# Patient Record
Sex: Female | Born: 1985
Health system: Southern US, Community
[De-identification: ages and names within clinical notes are randomized; demographics above are authoritative.]

## PROBLEM LIST (undated history)

## (undated) DIAGNOSIS — E079 Disorder of thyroid, unspecified: Secondary | ICD-10-CM

## (undated) DIAGNOSIS — E039 Hypothyroidism, unspecified: Secondary | ICD-10-CM

## (undated) DIAGNOSIS — F419 Anxiety disorder, unspecified: Secondary | ICD-10-CM

## (undated) DIAGNOSIS — U071 COVID-19: Secondary | ICD-10-CM

## (undated) DIAGNOSIS — T7840XA Allergy, unspecified, initial encounter: Secondary | ICD-10-CM

## (undated) DIAGNOSIS — D649 Anemia, unspecified: Secondary | ICD-10-CM

## (undated) DIAGNOSIS — D219 Benign neoplasm of connective and other soft tissue, unspecified: Secondary | ICD-10-CM

## (undated) HISTORY — DX: Hypothyroidism, unspecified: E03.9

## (undated) HISTORY — PX: CHOLECYSTECTOMY: SHX55

## (undated) HISTORY — DX: Allergy, unspecified, initial encounter: T78.40XA

## (undated) HISTORY — DX: Benign neoplasm of connective and other soft tissue, unspecified: D21.9

## (undated) HISTORY — PX: TUBAL LIGATION: SHX77

## (undated) HISTORY — PX: TONSILLECTOMY: SUR1361

## (undated) HISTORY — DX: Anemia, unspecified: D64.9

## (undated) HISTORY — DX: Anxiety disorder, unspecified: F41.9

---
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
## 2008-08-04 ENCOUNTER — Other Ambulatory Visit: Admission: RE | Admit: 2008-08-04 | Discharge: 2008-08-04 | Payer: Self-pay | Admitting: Obstetrics and Gynecology

## 2009-08-21 ENCOUNTER — Other Ambulatory Visit: Admission: RE | Admit: 2009-08-21 | Discharge: 2009-08-21 | Payer: Self-pay | Admitting: Obstetrics and Gynecology

## 2009-08-31 ENCOUNTER — Ambulatory Visit (HOSPITAL_COMMUNITY): Admission: RE | Admit: 2009-08-31 | Discharge: 2009-08-31 | Payer: Self-pay | Admitting: Obstetrics and Gynecology

## 2009-11-08 ENCOUNTER — Ambulatory Visit (HOSPITAL_COMMUNITY): Admission: RE | Admit: 2009-11-08 | Discharge: 2009-11-08 | Payer: Self-pay | Admitting: Obstetrics & Gynecology

## 2013-09-27 ENCOUNTER — Encounter (HOSPITAL_COMMUNITY): Payer: Self-pay | Admitting: Emergency Medicine

## 2013-09-27 ENCOUNTER — Emergency Department (HOSPITAL_COMMUNITY)
Admission: EM | Admit: 2013-09-27 | Discharge: 2013-09-27 | Disposition: A | Discharge status: Home / Self Care | Payer: Self-pay | Attending: Emergency Medicine | Admitting: Emergency Medicine

## 2013-09-27 DIAGNOSIS — R0781 Pleurodynia: Secondary | ICD-10-CM

## 2013-09-27 DIAGNOSIS — Y939 Activity, unspecified: Secondary | ICD-10-CM | POA: Insufficient documentation

## 2013-09-27 DIAGNOSIS — T148XXA Other injury of unspecified body region, initial encounter: Secondary | ICD-10-CM

## 2013-09-27 DIAGNOSIS — Y929 Unspecified place or not applicable: Secondary | ICD-10-CM | POA: Insufficient documentation

## 2013-09-27 DIAGNOSIS — X58XXXA Exposure to other specified factors, initial encounter: Secondary | ICD-10-CM | POA: Insufficient documentation

## 2013-09-27 DIAGNOSIS — S298XXA Other specified injuries of thorax, initial encounter: Secondary | ICD-10-CM | POA: Insufficient documentation

## 2013-09-27 DIAGNOSIS — S2341XA Sprain of ribs, initial encounter: Secondary | ICD-10-CM | POA: Insufficient documentation

## 2013-09-27 MED ORDER — DICLOFENAC SODIUM 75 MG PO TBEC: 75.0000 mg | DELAYED_RELEASE_TABLET | Freq: Two times a day (BID) | ORAL | Status: DC

## 2013-09-27 MED ORDER — METHOCARBAMOL 500 MG PO TABS: 500.0000 mg | ORAL_TABLET | Freq: Three times a day (TID) | ORAL | Status: DC

## 2013-09-27 MED ORDER — ACETAMINOPHEN-CODEINE #3 300-30 MG PO TABS: 1.0000 | ORAL_TABLET | Freq: Four times a day (QID) | ORAL | Status: DC | PRN

## 2013-09-27 NOTE — Discharge Instructions (Signed)
Your vital signs are within normal limits. Your lung exam is clear. Suspect a muscle related pain. Please rest your rib area muscles as much as possible. Use robaxin three times daily. Use diclofenac 2 times daily with food until all taken. Use tylenol codeine for pain if needed. Robaxin and tylenol codeine may cause drowsiness, use with caution. Chest Wall Pain Chest wall pain is pain in or around the bones and muscles of your chest. It may take up to 6 weeks to get better. It may take longer if you must stay physically active in your work and activities.  CAUSES  Chest wall pain may happen on its own. However, it may be caused by:  A viral illness like the flu.  Injury.  Coughing.  Exercise.  Arthritis.  Fibromyalgia.  Shingles. HOME CARE INSTRUCTIONS   Avoid overtiring physical activity. Try not to strain or perform activities that cause pain. This includes any activities using your chest or your abdominal and side muscles, especially if heavy weights are used.  Put ice on the sore area.  Put ice in a plastic bag.  Place a towel between your skin and the bag.  Leave the ice on for 15-20 minutes per hour while awake for the first 2 days.  Only take over-the-counter or prescription medicines for pain, discomfort, or fever as directed by your caregiver. SEEK IMMEDIATE MEDICAL CARE IF:   Your pain increases, or you are very uncomfortable.  You have a fever.  Your chest pain becomes worse.  You have new, unexplained symptoms.  You have nausea or vomiting.  You feel sweaty or lightheaded.  You have a cough with phlegm (sputum), or you cough up blood. MAKE SURE YOU:   Understand these instructions.  Will watch your condition.  Will get help right away if you are not doing well or get worse. Document Released: 01/07/2005 Document Revised: 04/01/2011 Document Reviewed: 09/03/2010 [facility] Patient Information 2015 [place], [place]. This information is not intended to  replace advice given to you by your health care provider. Make sure you discuss any questions you have with your health care provider.  Muscle Strain A muscle strain (pulled muscle) happens when a muscle is stretched beyond normal length. It happens when a sudden, violent force stretches your muscle too far. Usually, a few of the fibers in your muscle are torn. Muscle strain is common in athletes. Recovery usually takes 1-2 weeks. Complete healing takes 5-6 weeks.  HOME CARE   Follow the PRICE method of treatment to help your injury get better. Do this the first 2-3 days after the injury:  Protect. Protect the muscle to keep it from getting injured again.  Rest. Limit your activity and rest the injured body part.  Ice. Put ice in a plastic bag. Place a towel between your skin and the bag. Then, apply the ice and leave it on from 15-20 minutes each hour. After the third day, switch to moist heat packs.  Compression. Use a splint or elastic bandage on the injured area for comfort. Do not put it on too tightly.  Elevate. Keep the injured body part above the level of your heart.  Only take medicine as told by your doctor.  Warm up before doing exercise to prevent future muscle strains. GET HELP IF:   You have more pain or puffiness (swelling) in the injured area.  You feel numbness, tingling, or notice a loss of strength in the injured area. MAKE SURE YOU:   Understand these  instructions.  Will watch your condition.  Will get help right away if you are not doing well or get worse. Document Released: 10/17/2007 Document Revised: 10/28/2012 Document Reviewed: 08/06/2012 [facility] Patient Information 2015 [place], [place]. This information is not intended to replace advice given to you by your health care provider. Make sure you discuss any questions you have with your health care provider.

## 2013-09-27 NOTE — ED Notes (Signed)
Pt reports bilateral rib pain for several days. Pt denies any known injury. nad noted. Airway patent. No dyspnea noted in triage. Pt reports rib pain worse with movement and taking a deep breath.

## 2013-09-27 NOTE — ED Provider Notes (Signed)
Medical screening examination/treatment/procedure(s) were performed by non-physician practitioner and as supervising physician I was immediately available for consultation/collaboration.    Dorie Rank, MD 09/27/13 2239

## 2013-09-27 NOTE — ED Provider Notes (Signed)
CSN: [id number]     Arrival date & time 09/27/13  1535 History   None    Chief Complaint  Patient presents with  . Rib Injury     (Consider location/radiation/quality/duration/timing/severity/associated sxs/prior Treatment) HPI Comments: CC: bilat rib pain.  Pt reports 3-4 days of bilat rib pain. Pt c/o soreness and aching. Today pt noted pains in the ribs, worse with deep breathing.  No cough. No hx of injury. 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 This involves lifting and reaching over her head frequently. No fever. No operations or procedures involving the chest. Pt has taken advil and tylenol, but without improvement.  The history is provided by the patient.    History reviewed. No pertinent past medical history. Past Surgical History  Procedure Laterality Date  . Cholecystectomy    . Tonsillectomy     History reviewed. No pertinent family history. History  Substance Use Topics  . Smoking status: Never Smoker   . Smokeless tobacco: Not on file  . Alcohol Use: No   OB History   Grav Para Term Preterm Abortions TAB SAB Ect Mult Living                 Review of Systems  Constitutional: Negative for fever, chills and diaphoresis.  HENT: Negative.   Eyes: Negative.   Respiratory: Negative.   Cardiovascular: Negative for palpitations and leg swelling.  Gastrointestinal: Negative.   Endocrine: Negative.   Genitourinary: Negative.   Musculoskeletal: Negative.   Skin: Negative.   Allergic/Immunologic: Negative.   Neurological: Negative.   Hematological: Negative.   Psychiatric/Behavioral: Negative.       Allergies  Review of patient's allergies indicates not on file.  Home Medications   Prior to Admission medications   Not on File   BP 134/86  Pulse 80  Temp(Src) 98.5 F (36.9 C) (Oral)  Resp 18  Ht 5\' 3"  (1.6 m)  Wt 197 lb (89.359 kg)  BMI 34.91 kg/m2  SpO2 100%  LMP 08/30/2013 Physical Exam  Nursing note and vitals reviewed. Constitutional: She is  oriented to person, place, and time. She appears well-developed and well-nourished.  Non-toxic appearance.  HENT:  Head: Normocephalic.  Right Ear: Tympanic membrane and external ear normal.  Left Ear: Tympanic membrane and external ear normal.  Eyes: EOM and lids are normal. Pupils are equal, round, and reactive to light.  Neck: Normal range of motion. Neck supple. Carotid bruit is not present.  Cardiovascular: Normal rate, regular rhythm, normal heart sounds, intact distal pulses and normal pulses.   Pulmonary/Chest: Breath sounds normal. No respiratory distress.  Abdominal: Soft. Bowel sounds are normal. There is no tenderness. There is no guarding.  Musculoskeletal: Normal range of motion.  Lymphadenopathy:       Head (right side): No submandibular adenopathy present.       Head (left side): No submandibular adenopathy present.    She has no cervical adenopathy.  Neurological: She is alert and oriented to person, place, and time. She has normal strength. No cranial nerve deficit or sensory deficit.  Skin: Skin is warm and dry.  Psychiatric: She has a normal mood and affect. Her speech is normal.    ED Course  Procedures (including critical care time) Labs Review Labs Reviewed - No data to display  Imaging Review No results found.   EKG Interpretation None      MDM  Vital signs wnl PUlse ox 100% on room air. No trauma. Low probability of PE by  PERC score and Wells Crit. Suspect muscle strain . Rx for tylenol codeine, diclofenac, alnd robaxin given to the patient.   Final diagnoses:  None    **I have reviewed nursing notes, vital signs, and all appropriate lab and imaging results for this patient.Lenox Ahr, PA-C 09/27/13 1700

## 2015-08-04 DIAGNOSIS — R079 Chest pain, unspecified: Secondary | ICD-10-CM | POA: Diagnosis not present

## 2015-08-04 DIAGNOSIS — R0789 Other chest pain: Secondary | ICD-10-CM | POA: Diagnosis not present

## 2015-08-04 DIAGNOSIS — D649 Anemia, unspecified: Secondary | ICD-10-CM | POA: Diagnosis not present

## 2015-08-04 DIAGNOSIS — Z9049 Acquired absence of other specified parts of digestive tract: Secondary | ICD-10-CM | POA: Diagnosis not present

## 2015-12-03 ENCOUNTER — Ambulatory Visit (HOSPITAL_COMMUNITY)
Admission: EM | Admit: 2015-12-03 | Discharge: 2015-12-03 | Disposition: A | Discharge status: Home / Self Care | Payer: 59 | Attending: Family Medicine | Admitting: Family Medicine

## 2015-12-03 ENCOUNTER — Encounter (HOSPITAL_COMMUNITY): Payer: Self-pay | Admitting: *Deleted

## 2015-12-03 DIAGNOSIS — H6591 Unspecified nonsuppurative otitis media, right ear: Secondary | ICD-10-CM | POA: Diagnosis not present

## 2015-12-03 HISTORY — DX: Hypothyroidism, unspecified: E03.9

## 2015-12-03 HISTORY — DX: Disorder of thyroid, unspecified: E07.9

## 2015-12-03 MED ORDER — CIPROFLOXACIN-DEXAMETHASONE 0.3-0.1 % OT SUSP: OTIC | 0 refills | Status: AC

## 2015-12-03 NOTE — ED Triage Notes (Signed)
Has been using Benadryl, allergy meds, Dayquil for past 3 wks for cold sxs without any improvement.  Now c/o bilat earache x 3 days - R>L.  C/O decreased hearing.  Unsure if fevers.

## 2015-12-03 NOTE — Discharge Instructions (Signed)
It was nice seeing you. You do have middle ear effusion which can be treated conservatively with over the counter antihistamine such as zyrtec and pain medication. If no improvement in 3-4 days or symptoms worsens you may use the A/B prescribed. Please schedule follow up with ENT for reassessment if no improvement in your hearing.

## 2015-12-03 NOTE — ED Provider Notes (Signed)
[place]    CSN: [id number] Arrival date & time: 12/03/15  1528     History   Chief Complaint No chief complaint on file.   HPI Hannah Osborn is a 30 y.o. female.   The history is provided by the patient. No language interpreter was used.  Otalgia  Location:  Bilateral (Started with the right ear 3 days ago, now going to the left. She has been coughing for three weeks too) Quality:  Dull and pressure Severity:  Moderate Onset quality:  Gradual Duration:  3 days Timing:  Constant Progression:  Worsening Chronicity:  New Context: recent URI   Context: not direct blow, not elevation change and not loud noise   Relieved by:  Nothing Worsened by:  Nothing Ineffective treatments:  OTC medications Associated symptoms: congestion, hearing loss, rhinorrhea and sore throat   Associated symptoms: no tinnitus and no vomiting   Associated symptoms comment:  Cough now resolving. Right ear feels muffled   No past medical history on file.  There are no active problems to display for this patient.   Past Surgical History:  Procedure Laterality Date  . CHOLECYSTECTOMY    . TONSILLECTOMY      OB History    No data available       Home Medications    Prior to Admission medications   Medication Sig Start Date End Date Taking? Authorizing Provider  acetaminophen-codeine (TYLENOL #3) 300-30 MG per tablet Take 1-2 tablets by mouth every 6 (six) hours as needed for moderate pain. 09/27/13   Lily Kocher, PA-C  diclofenac (VOLTAREN) 75 MG EC tablet Take 1 tablet (75 mg total) by mouth 2 (two) times daily. 09/27/13   Lily Kocher, PA-C  methocarbamol (ROBAXIN) 500 MG tablet Take 1 tablet (500 mg total) by mouth 3 (three) times daily. 09/27/13   Lily Kocher, PA-C    Family History No family history on file.  Social History Social History  Substance Use Topics  . Smoking status: Never Smoker  . Smokeless tobacco: Not on file  . Alcohol use No      Allergies   Patient has no known allergies.   Review of Systems Review of Systems  HENT: Positive for congestion, ear pain, hearing loss, rhinorrhea and sore throat. Negative for tinnitus.   Respiratory: Negative.   Cardiovascular: Negative.   Gastrointestinal: Negative for vomiting.     Physical Exam Triage Vital Signs ED Triage Vitals  Enc Vitals Group     BP      Pulse      Resp      Temp      Temp src      SpO2      Weight      Height      Head Circumference      Peak Flow      Pain Score      Pain Loc      Pain Edu?      Excl. in [place]?    No data found.   Updated Vital Signs There were no vitals taken for this visit.  Visual Acuity Right Eye Distance:   Left Eye Distance:   Bilateral Distance:    Right Eye Near:   Left Eye Near:    Bilateral Near:     Physical Exam  Constitutional: She appears well-developed. No distress.  HENT:  Head: Normocephalic.  Right Ear: Tympanic membrane is not injected, not erythematous, not retracted and not bulging. No hemotympanum.  Left Ear: Tympanic membrane normal. Tympanic membrane is not injected, not erythematous, not retracted and not bulging.  No middle ear effusion. No hemotympanum.  Mouth/Throat: Uvula is midline, oropharynx is clear and moist and mucous membranes are normal.  Cardiovascular: Normal rate, regular rhythm and normal heart sounds.  Exam reveals no friction rub.   No murmur heard. Pulmonary/Chest: Effort normal and breath sounds normal. No respiratory distress. She has no wheezes. She has no rales.  Nursing note and vitals reviewed.    UC Treatments / Results  Labs (all labs ordered are listed, but only abnormal results are displayed) Labs Reviewed - No data to display  EKG  EKG Interpretation None       Radiology No results found.  Procedures Procedures (including critical care time)  Medications Ordered in UC Medications - No data to display   Initial Impression /  Assessment and Plan / UC Course  I have reviewed the triage vital signs and the nursing notes.  Pertinent labs & imaging results that were available during my care of the patient were reviewed by me and considered in my medical decision making (see chart for details).  Clinical Course as of Dec 02 1698  Sun Dec 03, 2015  1658 She has middle ear effusion. May be related to viral URI vs previous otitis media. Conservative measures recommended with antihistamine and pain meds. Ciprodex prescribed to start use if symptoms worsens despite conservative measures. Advised to follow up with ENT soon if no improvement in her hearing. She agreed with plan  [KE]    Clinical Course User Index [KE] Kinnie Feil, MD      Final Clinical Impressions(s) / UC Diagnoses   Final diagnoses:  None   Fluid level behind tympanic membrane of right ear   New Prescriptions New Prescriptions   No medications on file     Kinnie Feil, MD 12/03/15 1701

## 2015-12-18 ENCOUNTER — Ambulatory Visit (INDEPENDENT_AMBULATORY_CARE_PROVIDER_SITE_OTHER): Payer: 59 | Admitting: Family Medicine

## 2015-12-18 ENCOUNTER — Encounter: Payer: Self-pay | Admitting: Family Medicine

## 2015-12-18 VITALS — BP 124/76 | HR 88 | Temp 98.9°F | Resp 16 | Ht 63.25 in | Wt 195.1 lb

## 2015-12-18 DIAGNOSIS — N921 Excessive and frequent menstruation with irregular cycle: Secondary | ICD-10-CM | POA: Diagnosis not present

## 2015-12-18 DIAGNOSIS — N920 Excessive and frequent menstruation with regular cycle: Secondary | ICD-10-CM | POA: Insufficient documentation

## 2015-12-18 DIAGNOSIS — N3010 Interstitial cystitis (chronic) without hematuria: Secondary | ICD-10-CM | POA: Diagnosis not present

## 2015-12-18 DIAGNOSIS — E079 Disorder of thyroid, unspecified: Secondary | ICD-10-CM | POA: Diagnosis not present

## 2015-12-18 DIAGNOSIS — N83299 Other ovarian cyst, unspecified side: Secondary | ICD-10-CM

## 2015-12-18 DIAGNOSIS — D649 Anemia, unspecified: Secondary | ICD-10-CM

## 2015-12-18 DIAGNOSIS — Z8241 Family history of sudden cardiac death: Secondary | ICD-10-CM

## 2015-12-18 DIAGNOSIS — E6609 Other obesity due to excess calories: Secondary | ICD-10-CM | POA: Diagnosis not present

## 2015-12-18 DIAGNOSIS — Z7689 Persons encountering health services in other specified circumstances: Secondary | ICD-10-CM | POA: Diagnosis not present

## 2015-12-18 DIAGNOSIS — Z9109 Other allergy status, other than to drugs and biological substances: Secondary | ICD-10-CM

## 2015-12-18 DIAGNOSIS — J0101 Acute recurrent maxillary sinusitis: Secondary | ICD-10-CM

## 2015-12-18 DIAGNOSIS — Z6834 Body mass index (BMI) 34.0-34.9, adult: Secondary | ICD-10-CM | POA: Diagnosis not present

## 2015-12-18 MED ORDER — AMOXICILLIN 875 MG PO TABS: 875.0000 mg | ORAL_TABLET | Freq: Two times a day (BID) | ORAL | 0 refills | Status: DC

## 2015-12-18 MED ORDER — PREDNISONE 20 MG PO TABS: 20.0000 mg | ORAL_TABLET | Freq: Every day | ORAL | 0 refills | Status: DC

## 2015-12-18 NOTE — Progress Notes (Signed)
Chief Complaint  Patient presents with  . Establish Care   Has not been to a doctor in 7 years Has a list of medical problems she would like to be evaluated for Has an appt with GYN in the next couple of weeks Has an appt with ENT for next month  She has a history of thyroid disease and is not treated  Has a history of interstitial cystitis and has some urine symptoms She has menorrhagia and dysmenorrhea history of ovarian cysts History of allergies and current sinus infection for 2 months History of low blood count and "eats ice" all the time She gets frequent diarrhea after eating since her gallbladder surgery  She has had a flu shot this year at work She is up to date with tetanus per employee health     Patient Active Problem List   Diagnosis Date Noted  . Thyroid disorder 12/18/2015  . Interstitial cystitis 12/18/2015  . Anemia 12/18/2015  . Functional ovarian cysts 12/18/2015  . Menorrhagia 12/18/2015  . Class 1 obesity due to excess calories with body mass index (BMI) of 34.0 to 34.9 in adult 12/18/2015  . Environmental allergies 12/18/2015    Outpatient Encounter Prescriptions as of 12/18/2015  Medication Sig  . amoxicillin (AMOXIL) 875 MG tablet Take 1 tablet (875 mg total) by mouth 2 (two) times daily.  . predniSONE (DELTASONE) 20 MG tablet Take 1 tablet (20 mg total) by mouth daily with breakfast.   No facility-administered encounter medications on file as of 12/18/2015.     Past Medical History:  Diagnosis Date  . Allergy   . Anemia   . Anxiety   . Hypothyroidism   . Thyroid disease     Past Surgical History:  Procedure Laterality Date  . CHOLECYSTECTOMY    . TONSILLECTOMY      Social History   Social History  . Marital status: Single    Spouse name: N/A  . Number of children: 1  . Years of education: 71   Occupational History  . dietary aid [employer]   Social History Main Topics  . Smoking status: Never Smoker  .  Smokeless tobacco: Never Used  . Alcohol use No  . Drug use: No  . Sexual activity: Yes    Birth control/ protection: None   Other Topics Concern  . Not on file   Social History Narrative   Lives with son Naida Sleight   Boyfriend for 48 years   Works in Banker at [employer]    Family History  Problem Relation Age of Onset  . Pulmonary embolism Mother   . Early death Mother   . Cancer Father     stomach  . Early death Father   . Asthma Son   . Allergies Son   . Heart disease Maternal Uncle   . Cancer Maternal Grandmother     uterine  . Cancer Maternal Grandfather     colon    Review of Systems  Constitutional: Positive for malaise/fatigue. Negative for chills, fever and weight loss.  HENT: Positive for congestion, ear pain and sinus pain. Negative for hearing loss.   Eyes: Negative for blurred vision and pain.  Respiratory: Negative for cough and shortness of breath.   Cardiovascular: Negative for chest pain and leg swelling.  Gastrointestinal: Negative for abdominal pain, constipation, diarrhea and heartburn.  Genitourinary: Positive for dysuria. Negative for frequency.       Menstrual problems  Musculoskeletal: Negative for falls,  joint pain and myalgias.  Neurological: Negative for dizziness, seizures and headaches.  Psychiatric/Behavioral: Negative for depression. The patient is not nervous/anxious and does not have insomnia.     BP 124/76 (BP Location: Right Arm, Patient Position: Sitting, Cuff Size: Normal)   Pulse 88   Temp 98.9 F (37.2 C) (Oral)   Resp 16   Ht 5' 3.25" (1.607 m)   Wt 195 lb 1.9 oz (88.5 kg)   LMP 12/02/2015 (Exact Date)   SpO2 100%   BMI 34.29 kg/m   Physical Exam  Constitutional: She is oriented to person, place, and time. She appears well-developed and well-nourished.  HENT:  Head: Normocephalic and atraumatic.  Right Ear: External ear normal.  Left Ear: External ear normal.  Mouth/Throat: Oropharynx is clear and moist.    Right TM with serous fluid  Eyes: Conjunctivae are normal. Pupils are equal, round, and reactive to light.  Neck: Normal range of motion. Neck supple. No thyromegaly present.  Cardiovascular: Normal rate, regular rhythm and normal heart sounds.   Pulmonary/Chest: Effort normal and breath sounds normal. No respiratory distress.  Abdominal: Soft. Bowel sounds are normal.  Musculoskeletal: Normal range of motion. She exhibits no edema.  Lymphadenopathy:    She has no cervical adenopathy.  Neurological: She is alert and oriented to person, place, and time.  Gait normal  Skin: Skin is warm and dry.  Psychiatric: She has a normal mood and affect. Her behavior is normal. Thought content normal.  Nursing note and vitals reviewed.  ASSESSMENT/PLAN:   1. Encounter to establish care with new doctor  - CBC with Differential/Platelet - COMPLETE METABOLIC PANEL WITH GFR - Hemoglobin A1c - Lipid panel - TSH - Urinalysis, Routine w reflex microscopic (not at [facility]) - VITAMIN D 25 Hydroxy (Vit-D Deficiency, Fractures)  2. Thyroid disorder   3. Interstitial cystitis   4. Anemia, unspecified type   5. Functional ovarian cysts   6. Menorrhagia with irregular cycle   7. Class 1 obesity due to excess calories without serious comorbidity with body mass index (BMI) of 34.0 to 34.9 in adult   8. Environmental allergies   9. Family history of death due to heart problem at 58 years of age or younger   3. Acute recurrent maxillary sinusitis    Patient Instructions  Drink plenty of water Use flonase or a steroid nasal spray every day Take the prednisone and the amoxicillin daily for 10 days See ENT if this does not clear up the infection  I have ordered labs I will send you a letter with your test results.  If there is anything of concern, we will call right away. See me in 2-3 weeks  See GYN as scheduled    Raylene Everts, MD

## 2015-12-18 NOTE — Patient Instructions (Addendum)
Drink plenty of water Use flonase or a steroid nasal spray every day Take the prednisone and the amoxicillin daily for 10 days See ENT if this does not clear up the infection  I have ordered labs I will send you a letter with your test results.  If there is anything of concern, we will call right away. See me in 2-3 weeks  See GYN as scheduled

## 2015-12-19 ENCOUNTER — Other Ambulatory Visit: Payer: Self-pay | Admitting: Family Medicine

## 2015-12-19 DIAGNOSIS — D649 Anemia, unspecified: Secondary | ICD-10-CM

## 2015-12-19 LAB — CBC WITH DIFFERENTIAL/PLATELET
BASOS ABS: 0 {cells}/uL (ref 0–200)
Basophils Relative: 0 %
EOS PCT: 1 %
Eosinophils Absolute: 58 cells/uL (ref 15–500)
HCT: 27 % — ABNORMAL LOW (ref 35.0–45.0)
HEMOGLOBIN: 7.4 g/dL — AB (ref 11.7–15.5)
LYMPHS ABS: 1624 {cells}/uL (ref 850–3900)
Lymphocytes Relative: 28 %
MCH: 15.7 pg — AB (ref 27.0–33.0)
MCHC: 27.4 g/dL — AB (ref 32.0–36.0)
MCV: 57.4 fL — ABNORMAL LOW (ref 80.0–100.0)
MONOS PCT: 4 %
Monocytes Absolute: 232 cells/uL (ref 200–950)
NEUTROS PCT: 67 %
Neutro Abs: 3886 cells/uL (ref 1500–7800)
Platelets: 416 10*3/uL — ABNORMAL HIGH (ref 140–400)
RBC: 4.7 MIL/uL (ref 3.80–5.10)
RDW: 19.6 % — ABNORMAL HIGH (ref 11.0–15.0)
WBC: 5.8 10*3/uL (ref 3.8–10.8)

## 2015-12-19 LAB — COMPLETE METABOLIC PANEL WITH GFR
ALBUMIN: 4 g/dL (ref 3.6–5.1)
ALK PHOS: 67 U/L (ref 33–115)
ALT: 16 U/L (ref 6–29)
AST: 20 U/L (ref 10–30)
BUN: 13 mg/dL (ref 7–25)
CO2: 25 mmol/L (ref 20–31)
Calcium: 9.1 mg/dL (ref 8.6–10.2)
Chloride: 106 mmol/L (ref 98–110)
Creat: 0.87 mg/dL (ref 0.50–1.10)
GFR, Est African American: >89 mL/min (ref >=60)
GLUCOSE: 95 mg/dL (ref 65–99)
POTASSIUM: 4.2 mmol/L (ref 3.5–5.3)
SODIUM: 139 mmol/L (ref 135–146)
Total Bilirubin: 0.2 mg/dL (ref 0.2–1.2)
Total Protein: 6.8 g/dL (ref 6.1–8.1)

## 2015-12-19 LAB — LIPID PANEL
CHOLESTEROL: 188 mg/dL (ref <200)
HDL: 52 mg/dL (ref >50)
LDL Cholesterol: 91 mg/dL (ref <100)
TRIGLYCERIDES: 224 mg/dL — AB (ref <150)
Total CHOL/HDL Ratio: 3.6 Ratio (ref <5.0)
VLDL: 45 mg/dL — AB (ref <30)

## 2015-12-19 LAB — URINALYSIS, ROUTINE W REFLEX MICROSCOPIC
Bilirubin Urine: NEGATIVE
Glucose, UA: NEGATIVE
Hgb urine dipstick: NEGATIVE
Ketones, ur: NEGATIVE
LEUKOCYTES UA: NEGATIVE
NITRITE: NEGATIVE
PH: 7.5 (ref 5.0–8.0)
Protein, ur: NEGATIVE
SPECIFIC GRAVITY, URINE: 1.015 (ref 1.001–1.035)

## 2015-12-19 LAB — TSH: TSH: 4.1 mIU/L

## 2015-12-19 LAB — HEMOGLOBIN A1C
HEMOGLOBIN A1C: 4.9 % (ref <5.7)
MEAN PLASMA GLUCOSE: 94 mg/dL

## 2015-12-19 LAB — VITAMIN D 25 HYDROXY (VIT D DEFICIENCY, FRACTURES): Vit D, 25-Hydroxy: 9 ng/mL — ABNORMAL LOW (ref 30–100)

## 2015-12-19 MED ORDER — VITAMIN D (ERGOCALCIFEROL) 1.25 MG (50000 UNIT) PO CAPS: 50000.0000 [IU] | ORAL_CAPSULE | ORAL | 1 refills | Status: DC

## 2015-12-21 ENCOUNTER — Encounter (HOSPITAL_COMMUNITY): Payer: 59 | Attending: Oncology | Admitting: Oncology

## 2015-12-21 ENCOUNTER — Encounter (HOSPITAL_COMMUNITY): Payer: Self-pay | Admitting: Oncology

## 2015-12-21 ENCOUNTER — Encounter (HOSPITAL_COMMUNITY): Payer: 59

## 2015-12-21 VITALS — BP 127/79 | HR 95 | Temp 99.0°F | Resp 16 | Wt 195.6 lb

## 2015-12-21 DIAGNOSIS — D508 Other iron deficiency anemias: Secondary | ICD-10-CM

## 2015-12-21 DIAGNOSIS — Z808 Family history of malignant neoplasm of other organs or systems: Secondary | ICD-10-CM

## 2015-12-21 DIAGNOSIS — D539 Nutritional anemia, unspecified: Secondary | ICD-10-CM | POA: Diagnosis not present

## 2015-12-21 DIAGNOSIS — Z5189 Encounter for other specified aftercare: Secondary | ICD-10-CM | POA: Diagnosis not present

## 2015-12-21 DIAGNOSIS — Z79899 Other long term (current) drug therapy: Secondary | ICD-10-CM | POA: Diagnosis not present

## 2015-12-21 DIAGNOSIS — D509 Iron deficiency anemia, unspecified: Secondary | ICD-10-CM | POA: Insufficient documentation

## 2015-12-21 DIAGNOSIS — Z800 Family history of malignant neoplasm of digestive organs: Secondary | ICD-10-CM | POA: Diagnosis not present

## 2015-12-21 DIAGNOSIS — Z9889 Other specified postprocedural states: Secondary | ICD-10-CM | POA: Insufficient documentation

## 2015-12-21 DIAGNOSIS — K625 Hemorrhage of anus and rectum: Secondary | ICD-10-CM | POA: Insufficient documentation

## 2015-12-21 DIAGNOSIS — R531 Weakness: Secondary | ICD-10-CM | POA: Diagnosis not present

## 2015-12-21 DIAGNOSIS — Z9049 Acquired absence of other specified parts of digestive tract: Secondary | ICD-10-CM | POA: Insufficient documentation

## 2015-12-21 LAB — IRON AND TIBC
IRON: 8 ug/dL — AB (ref 28–170)
SATURATION RATIOS: 1 % — AB (ref 10.4–31.8)
TIBC: 557 ug/dL — ABNORMAL HIGH (ref 250–450)
UIBC: 549 ug/dL

## 2015-12-21 LAB — CBC WITH DIFFERENTIAL/PLATELET
BASOS ABS: 0 10*3/uL (ref 0.0–0.1)
BASOS PCT: 0 %
EOS ABS: 0 10*3/uL (ref 0.0–0.7)
Eosinophils Relative: 0 %
HCT: 26.6 % — ABNORMAL LOW (ref 36.0–46.0)
HEMOGLOBIN: 7.5 g/dL — AB (ref 12.0–15.0)
Lymphocytes Relative: 10 %
Lymphs Abs: 0.8 10*3/uL (ref 0.7–4.0)
MCH: 16 pg — AB (ref 26.0–34.0)
MCHC: 28.2 g/dL — AB (ref 30.0–36.0)
MCV: 56.7 fL — ABNORMAL LOW (ref 78.0–100.0)
MONO ABS: 0.1 10*3/uL (ref 0.1–1.0)
MONOS PCT: 2 %
NEUTROS ABS: 7.1 10*3/uL (ref 1.7–7.7)
Neutrophils Relative %: 88 %
PLATELETS: 379 10*3/uL (ref 150–400)
RBC: 4.69 MIL/uL (ref 3.87–5.11)
RDW: 19.6 % — AB (ref 11.5–15.5)
WBC: 8.1 10*3/uL (ref 4.0–10.5)

## 2015-12-21 LAB — FERRITIN: FERRITIN: 1 ng/mL — AB (ref 11–307)

## 2015-12-21 MED ORDER — FERROUS SULFATE 325 (65 FE) MG PO TBEC
325.0000 mg | DELAYED_RELEASE_TABLET | Freq: Two times a day (BID) | ORAL | 3 refills | Status: DC
Start: 2015-12-21 — End: 2016-10-30

## 2015-12-21 NOTE — Progress Notes (Signed)
[place] @ [facility] [phone]  Fax:([phone]) Irene OB: 1985/03/24  MR#: [id number]  JQZ#:[id number]  Patient Care Team: Jalene Mullet, PA-C as PCP - General (General Practice)  CHIEF COMPLAINT:  Chief Complaint  Patient presents with  . New Patient (Initial Visit)    VISIT DIAGNOSIS:     ICD-9-CM ICD-10-CM   1. Other iron deficiency anemia 280.8 D50.8 Hemoglobin and hematocrit, blood     Iron and TIBC     Ferritin     CBC with Differential     ferrous sulfate 325 (65 FE) MG EC tablet      No history exists.    No flowsheet data found.  INTERVAL HISTORY:   30 year old lady who presented with hemoglobin of 7.4. Has macrocytic anemia Patient has a heavy menstrual cycle for last several years Feeling weak and tired.  Patient has recurrent sinus infection recently has been treated with amoxicillin and prednisone Patient has occasional rectal bleeding and not been investigated any further at present time  REVIEW OF SYSTEMS:   GENERAL patient is feeling weak and tired.  No fevers, sweats or weight loss. PERFORMANCE STATUS (ECOG):  0 HEENT: Recurrent sinus infection.  Multiple emergency room visits.  Recently on amoxicillin and prednisone Lungs: Or redness of breath on exertion Cardiac:  No chest pain, palpitations, orthopnea, or PND. GI:  No nausea, vomiting, diarrhea, constipation, melena or hematochezia. GU:  No urgency, frequency, dysuria, or hematuria. Heavy menstrual cycle for last several months Musculoskeletal:  No back pain.  No joint pain.  No muscle tenderness. Extremities:  No pain or swelling. Skin:  No rashes or skin changes. Neuro:  No headache, numbness or weakness, balance or coordination issues. Endocrine:  No diabetes, thyroid issues, hot flashes or night sweats. Psych:  No mood changes, depression or anxiety. Pain:  No focal pain.  Review of systems:  All other systems reviewed and found to be  negative. As per HPI. Otherwise, a complete review of systems is negatve.  PAST MEDICAL HISTORY: Past Medical History:  Diagnosis Date  . Allergy   . Anemia   . Anxiety   . Hypothyroidism   . Thyroid disease     PAST SURGICAL HISTORY: Past Surgical History:  Procedure Laterality Date  . CHOLECYSTECTOMY    . TONSILLECTOMY      FAMILY HISTORY Family History  Problem Relation Age of Onset  . Pulmonary embolism Mother   . Early death Mother   . Cancer Father     stomach  . Early death Father   . Asthma Son   . Allergies Son   . Heart disease Maternal Uncle   . Cancer Maternal Grandmother     uterine  . Cancer Maternal Grandfather     colon    GYNECOLOGIC HISTORY:  Patient's last menstrual period was 12/02/2015 (exact date).     ADVANCED DIRECTIVES:  Patient does not have any living will or healthcare power of attorney.  Information was given .  Available resources had been discussed.  We will follow-up on subsequent appointments regarding this issue  HEALTH MAINTENANCE: Social History  Substance Use Topics  . Smoking status: Never Smoker  . Smokeless tobacco: Never Used  . Alcohol use No     Colonoscopy:  PAP:  Bone density:  Lipid panel:  No Known Allergies  Current Outpatient Prescriptions  Medication Sig Dispense Refill  . amoxicillin (AMOXIL) 875 MG tablet Take 1 tablet (875 mg total)  by mouth 2 (two) times daily. 20 tablet 0  . ferrous sulfate 325 (65 FE) MG EC tablet Take 1 tablet (325 mg total) by mouth 2 (two) times daily. 100 tablet 3  . predniSONE (DELTASONE) 20 MG tablet Take 1 tablet (20 mg total) by mouth daily with breakfast. 10 tablet 0   No current facility-administered medications for this visit.     OBJECTIVE: PHYSICAL EXAM: GENERAL:  Well developed, well nourished, sitting comfortably in the exam room in no acute distress.  MENTAL STATUS:  Alert and oriented to person, place and time. HEAD: face symmetric, no Cushingoid features.   Periorbital swelling secondary to steroid EYES: pale  looking conjunctiva Pupils equal round and reactive to light and accomodation.  No conjunctivitis or scleral icterus. ENT:  Oropharynx clear without lesion.  Tongue normal. Mucous membranes moist.  RESPIRATORY:  Clear to auscultation without rales, wheezes or rhonchi. CARDIOVASCULAR:  Regular rate and rhythm without murmur, rub or gallop.  Tachycardia BREAST:  Right breast without masses, skin changes or nipple discharge.  Left breast without masses, skin changes or nipple discharge. ABDOMEN:  Soft, non-tender, with active bowel sounds, and no hepatosplenomegaly.  No masses. BACK:  No CVA tenderness.  No tenderness on percussion of the back or rib cage. SKIN:  No rashes, ulcers or lesions. EXTREMITIES: No edema, no skin discoloration or tenderness.  No palpable cords. LYMPH NODES: No palpable cervical, supraclavicular, axillary or inguinal adenopathy  NEUROLOGICAL: Unremarkable. PSYCH:  Appropriate. Breast was not examined Vitals:   12/21/15 1501  BP: 127/79  Pulse: 95  Resp: 16  Temp: 99 F (37.2 C)     Body mass index is 34.38 kg/m.    ECOG FS:1 - Symptomatic but completely ambulatory  LAB RESULTS:  Office Visit on 12/18/2015  Component Date Value Ref Range Status  . WBC 12/19/2015 5.8  3.8 - 10.8 K/uL Final  . RBC 12/19/2015 4.70  3.80 - 5.10 MIL/uL Final   Comment: Elliptocytes Schistocytes present (2-10/hpf)   . Hemoglobin 12/19/2015 7.4* 11.7 - 15.5 g/dL Final  . HCT 12/19/2015 27.0* 35.0 - 45.0 % Final  . MCV 12/19/2015 57.4* 80.0 - 100.0 fL Final  . MCH 12/19/2015 15.7* 27.0 - 33.0 pg Final  . MCHC 12/19/2015 27.4* 32.0 - 36.0 g/dL Final  . RDW 12/19/2015 19.6* 11.0 - 15.0 % Final  . Platelets 12/19/2015 416* 140 - 400 K/uL Final  . Neutro Abs 12/19/2015 3886  1,500 - 7,800 cells/uL Final  . Lymphs Abs 12/19/2015 1624  850 - 3,900 cells/uL Final  . Monocytes Absolute 12/19/2015 232  200 - 950 cells/uL Final  .  Eosinophils Absolute 12/19/2015 58  15 - 500 cells/uL Final  . Basophils Absolute 12/19/2015 0  0 - 200 cells/uL Final  . Neutrophils Relative % 12/19/2015 67  % Final  . Lymphocytes Relative 12/19/2015 28  % Final  . Monocytes Relative 12/19/2015 4  % Final  . Eosinophils Relative 12/19/2015 1  % Final  . Basophils Relative 12/19/2015 0  % Final  . Smear Review 12/19/2015 Criteria for review not met   Final  . Sodium 12/19/2015 139  135 - 146 mmol/L Final  . Potassium 12/19/2015 4.2  3.5 - 5.3 mmol/L Final  . Chloride 12/19/2015 106  98 - 110 mmol/L Final  . CO2 12/19/2015 25  20 - 31 mmol/L Final  . Glucose, Bld 12/19/2015 95  65 - 99 mg/dL Final  . BUN 12/19/2015 13  7 - 25 mg/dL Final  .  Creat 12/19/2015 0.87  0.50 - 1.10 mg/dL Final  . Total Bilirubin 12/19/2015 0.2  0.2 - 1.2 mg/dL Final  . Alkaline Phosphatase 12/19/2015 67  33 - 115 U/L Final  . AST 12/19/2015 20  10 - 30 U/L Final  . ALT 12/19/2015 16  6 - 29 U/L Final  . Total Protein 12/19/2015 6.8  6.1 - 8.1 g/dL Final  . Albumin 12/19/2015 4.0  3.6 - 5.1 g/dL Final  . Calcium 12/19/2015 9.1  8.6 - 10.2 mg/dL Final  . GFR, Est African American 12/19/2015 >89  >=60 mL/min Final  . GFR, Est Non African American 12/19/2015 >89  >=60 mL/min Final  . Hgb A1c MFr Bld 12/19/2015 4.9  <5.7 % Final   Comment:   For the purpose of screening for the presence of diabetes:   <5.7%       Consistent with the absence of diabetes 5.7-6.4 %   Consistent with increased risk for diabetes (prediabetes) >=6.5 %     Consistent with diabetes   This assay result is consistent with a decreased risk of diabetes.   Currently, no consensus exists regarding use of hemoglobin A1c for diagnosis of diabetes in children.   According to American Diabetes Association (ADA) guidelines, hemoglobin A1c <7.0% represents optimal control in non-pregnant diabetic patients. Different metrics may apply to specific patient populations. Standards of Medical  Care in Diabetes (ADA).     . Mean Plasma Glucose 12/19/2015 94  mg/dL Final  . Cholesterol 12/19/2015 188  <200 mg/dL Final   Comment: ** Please note change in reference range(s). **     . Triglycerides 12/19/2015 224* <150 mg/dL Final   Comment: ** Please note change in reference range(s). **     . HDL 12/19/2015 52  >50 mg/dL Final   Comment: ** Please note change in reference range(s). **     . Total CHOL/HDL Ratio 12/19/2015 3.6  <5.0 Ratio Final  . VLDL 12/19/2015 45* <30 mg/dL Final  . LDL Cholesterol 12/19/2015 91  <100 mg/dL Final   Comment: ** Please note change in reference range(s). **     . TSH 12/19/2015 4.10  mIU/L Final   Comment:   Reference Range   > or = 20 Years  0.40-4.50   Pregnancy Range First trimester  0.26-2.66 Second trimester 0.55-2.73 Third trimester  0.43-2.91     . Color, Urine 12/19/2015 YELLOW  YELLOW Final  . APPearance 12/19/2015 CLEAR  CLEAR Final  . Specific Gravity, Urine 12/19/2015 1.015  1.001 - 1.035 Final  . pH 12/19/2015 7.5  5.0 - 8.0 Final  . Glucose, UA 12/19/2015 NEGATIVE  NEGATIVE Final  . Bilirubin Urine 12/19/2015 NEGATIVE  NEGATIVE Final  . Ketones, ur 12/19/2015 NEGATIVE  NEGATIVE Final  . Hgb urine dipstick 12/19/2015 NEGATIVE  NEGATIVE Final  . Protein, ur 12/19/2015 NEGATIVE  NEGATIVE Final  . Nitrite 12/19/2015 NEGATIVE  NEGATIVE Final  . Leukocytes, UA 12/19/2015 NEGATIVE  NEGATIVE Final  . Vit D, 25-Hydroxy 12/19/2015 9* 30 - 100 ng/mL Final   Comment: Vitamin D Status           25-OH Vitamin D        Deficiency                <20 ng/mL        Insufficiency         20 - 29 ng/mL        Optimal             >  or = 30 ng/mL   For 25-OH Vitamin D testing on patients on D2-supplementation and patients for whom quantitation of D2 and D3 fractions is required, the QuestAssureD 25-OH VIT D, (D2,D3), LC/MS/MS is recommended: order code [id number] (patients > 2 yrs).      STUDIES: No results found.  ASSESSMENT:    Macrocytic anemia most likely secondary to iron deficiency anemia Blood loss maybe secondary to heavy menstrual cycle however other causes cannot be ruled out.  Patient had occasional rectal bleeding and may need further workup. We will get iron studies done and if that indicates iron deficiency anemia patient will be started on iron tablet with consideration of intravenous iron.  If iron  study does not confirm severe iron deficiency anemia than patient may need further workup to rule out thalassemia. I had detailed discussion with patient regarding that. Patient continues to feel very weak and tired may want to go to emergency room for further evaluation. We will hold off any blood transfusion hoping to find correctable cause for anemia likely iron deficiency.   PLAN:  No problem-specific Assessment & Plan notes found for this encounter.   Patient expressed understanding and was in agreement with this plan. She also understands that She can call clinic at any time with any questions, concerns, or complaints.    No matching staging information was found for the patient.  Forest Gleason, MD   12/21/2015 3:32 PM

## 2015-12-21 NOTE — Patient Instructions (Addendum)
[place] at [facility] Discharge Instructions  RECOMMENDATIONS MADE BY THE CONSULTANT AND ANY TEST RESULTS WILL BE SENT TO YOUR REFERRING PHYSICIAN.  Exam with Dr. Oliva Bustard today. You will have labs as you leave today to test your iron levels. We are going to prescribe iron for you to start taking today.     Return to the clinic next week for labs and for doctor appointment.    Thank you for choosing [place] at [facility] to provide your oncology and hematology care.  To afford each patient quality time with our provider, please arrive at least 15 minutes before your scheduled appointment time.   Beginning January 23rd 2017 lab work for the [facility] will be done in the  Main lab at [facility] on 1st floor. If you have a lab appointment with the [facility] please come in thru the  Main Entrance and check in at the main information desk  You need to re-schedule your appointment should you arrive 10 or more minutes late.  We strive to give you quality time with our providers, and arriving late affects you and other patients whose appointments are after yours.  Also, if you no show three or more times for appointments you may be dismissed from the clinic at the providers discretion.     Again, thank you for choosing [facility].  Our hope is that these requests will decrease the amount of time that you wait before being seen by our physicians.       _____________________________________________________________  Should you have questions after your visit to [facility], please contact our office at [phone] between the hours of 8:30 a.m. and 4:30 p.m.  Voicemails left after 4:30 p.m. will not be returned until the following business day.  For prescription refill requests, have your pharmacy contact our office.         Resources For Cancer Patients and their Caregivers ? American Cancer Society: Can  assist with transportation, wigs, general needs, runs Look Good Feel Better.        646-880-7748 ? Cancer Care: Provides financial assistance, online support groups, medication/co-pay assistance.  1-800-813-HOPE 213-709-8354) ? [place] Assists [place] cancer patients and their families through emotional , educational and financial support.  (585) 631-4774 ? [place] DSS Where to apply for food stamps, Medicaid and utility assistance. (225)674-4481 ? [facility]: Transportation to medical appointments. (754) 779-4064 ? Social Security Administration: May apply for disability if have a Stage IV cancer. [phone] ? [employer], Disability and Transit Services: Assists with nutrition, care and transit needs. [facility] Support Programs: @10RELATIVEDAYS @ > Cancer Support Group  2nd Tuesday of the month 1pm-2pm, Journey Room  > Creative Journey  3rd Tuesday of the month 1130am-1pm, Journey Room  > Look Good Feel Better  1st Wednesday of the month 10am-12 noon, Journey Room (Call [place] to register [phone])

## 2015-12-26 ENCOUNTER — Other Ambulatory Visit (HOSPITAL_COMMUNITY): Payer: Self-pay

## 2015-12-26 ENCOUNTER — Other Ambulatory Visit: Payer: Self-pay | Admitting: Adult Health

## 2015-12-26 DIAGNOSIS — D508 Other iron deficiency anemias: Secondary | ICD-10-CM

## 2015-12-27 ENCOUNTER — Encounter (HOSPITAL_COMMUNITY): Payer: 59 | Attending: Hematology & Oncology

## 2015-12-27 ENCOUNTER — Ambulatory Visit (INDEPENDENT_AMBULATORY_CARE_PROVIDER_SITE_OTHER): Payer: 59 | Admitting: Adult Health

## 2015-12-27 ENCOUNTER — Encounter: Payer: Self-pay | Admitting: Adult Health

## 2015-12-27 ENCOUNTER — Other Ambulatory Visit (HOSPITAL_COMMUNITY)
Admission: RE | Admit: 2015-12-27 | Discharge: 2015-12-27 | Disposition: A | Discharge status: Home / Self Care | Payer: 59 | Source: Ambulatory Visit | Attending: Adult Health | Admitting: Adult Health

## 2015-12-27 VITALS — BP 130/68 | HR 86 | Ht 63.0 in | Wt 194.2 lb

## 2015-12-27 DIAGNOSIS — Z1151 Encounter for screening for human papillomavirus (HPV): Secondary | ICD-10-CM | POA: Diagnosis not present

## 2015-12-27 DIAGNOSIS — N946 Dysmenorrhea, unspecified: Secondary | ICD-10-CM | POA: Diagnosis not present

## 2015-12-27 DIAGNOSIS — N920 Excessive and frequent menstruation with regular cycle: Secondary | ICD-10-CM | POA: Diagnosis not present

## 2015-12-27 DIAGNOSIS — D509 Iron deficiency anemia, unspecified: Secondary | ICD-10-CM

## 2015-12-27 DIAGNOSIS — Z01419 Encounter for gynecological examination (general) (routine) without abnormal findings: Secondary | ICD-10-CM | POA: Diagnosis not present

## 2015-12-27 DIAGNOSIS — Z01411 Encounter for gynecological examination (general) (routine) with abnormal findings: Secondary | ICD-10-CM | POA: Diagnosis not present

## 2015-12-27 DIAGNOSIS — D508 Other iron deficiency anemias: Secondary | ICD-10-CM | POA: Insufficient documentation

## 2015-12-27 DIAGNOSIS — Z113 Encounter for screening for infections with a predominantly sexual mode of transmission: Secondary | ICD-10-CM | POA: Insufficient documentation

## 2015-12-27 LAB — CBC WITH DIFFERENTIAL/PLATELET
Basophils Absolute: 0 10*3/uL (ref 0.0–0.1)
Basophils Relative: 0 %
EOS ABS: 0 10*3/uL (ref 0.0–0.7)
EOS PCT: 0 %
HEMATOCRIT: 30.2 % — AB (ref 36.0–46.0)
Hemoglobin: 8.4 g/dL — ABNORMAL LOW (ref 12.0–15.0)
LYMPHS ABS: 0.9 10*3/uL (ref 0.7–4.0)
LYMPHS PCT: 8 %
MCH: 16.7 pg — AB (ref 26.0–34.0)
MCHC: 27.8 g/dL — AB (ref 30.0–36.0)
MCV: 59.9 fL — AB (ref 78.0–100.0)
MONO ABS: 0.2 10*3/uL (ref 0.1–1.0)
MONOS PCT: 2 %
Neutro Abs: 9.9 10*3/uL — ABNORMAL HIGH (ref 1.7–7.7)
Neutrophils Relative %: 90 %
PLATELETS: 323 10*3/uL (ref 150–400)
RBC: 5.04 MIL/uL (ref 3.87–5.11)
RDW: 22.6 % — AB (ref 11.5–15.5)
WBC: 11 10*3/uL — AB (ref 4.0–10.5)

## 2015-12-27 LAB — IRON AND TIBC
Iron: 135 ug/dL (ref 28–170)
Saturation Ratios: 27 % (ref 10.4–31.8)
TIBC: 503 ug/dL — AB (ref 250–450)
UIBC: 368 ug/dL

## 2015-12-27 LAB — FERRITIN: Ferritin: 8 ng/mL — ABNORMAL LOW (ref 11–307)

## 2015-12-27 MED ORDER — MEGESTROL ACETATE 40 MG PO TABS: ORAL_TABLET | ORAL | 3 refills | Status: DC

## 2015-12-27 NOTE — Progress Notes (Signed)
Patient ID: Hannah Osborn, female   DOB: 11/13/1985, 30 y.o.   MRN: [id number] History of Present Illness: Hannah Osborn is a single 30 year old black female in for a well woman gyn exam and pap,she has not had one in years.She has heavy periods, they last about 5-7 days and 3 days are heavy, she wears a diaper and changes every 2 hours and has cramps.She has headaches and is short of breath and has chest pain at times, and she is tired.She has to pee often and alternates constipation and diarrhea.She is anemic and sees Dr Oliva Bustard and her PCP is Dr Meda Coffee.  She works in Arboriculturist at [employer]. She says she take ASA for headaches, told to stop that, can try tylenol. Had HGB today was 8.4 which is up from 7.4.Ferritin level 1, Iron 8.   Current Medications, Allergies, Past Medical History, Past Surgical History, Family History and Social History were reviewed in [employer] record.     Review of Systems: Patient denies any hearing loss,  blurred vision,problems with  intercourse. No joint pain or mood swings.See HPI for positives.    Physical Exam:BP 130/68   Pulse 86   Ht 5\' 3"  (1.6 m)   Wt 194 lb 3.2 oz (88.1 kg)   LMP 12/27/2015   BMI 34.40 kg/m  General:  Well developed, well nourished, no acute distress Skin:  Warm and dry Neck:  Midline trachea, normal thyroid, good ROM, no lymphadenopathy Lungs; Clear to auscultation bilaterally Breast:  No dominant palpable mass, retraction, or nipple discharge,areas of vitiligo Cardiovascular: Regular rate and rhythm Abdomen:  Soft, non tender, no hepatosplenomegaly Pelvic:  External genitalia is normal in appearance, no lesions, has one area of vitiligo right inne thigh.  The vagina is normal in appearance, but has opening right side wall where had cyst I&D.Light period blood in vault. Urethra has no lesions or masses. The cervix is bulbous, pap with HPV and GC/CHL performed.  Uterus is felt to be normal size, shape, and contour.  No  adnexal masses or tenderness noted.Bladder is non tender, no masses felt. Extremities/musculoskeletal:  No swelling or varicosities noted, no clubbing or cyanosis Psych:  No mood changes, alert and cooperative,seems happy PHQ 2 score 1.  Will give megace to stop period, and get [place] to assess uterus, discussed options like OC,depo, IUD or ablation.  Impression: 1. Cervical smear, as part of routine gynecological examination   2. Menorrhagia with regular cycle   3. Dysmenorrhea   4. Iron deficiency anemia, unspecified iron deficiency anemia type       Plan: Meds ordered this encounter  Medications  . megestrol (MEGACE) 40 MG tablet    Sig: Take 3 x 5 days then 2 x 5 days then 1 daily with 1 refill    Dispense:  45 tablet    Refill:  3    Order Specific Question:   Supervising Provider    Answer:   Florian Buff [[id number]]  Review handout on menorrhagia  Return in 1 week for GYN [place] to assess uterus and ovaries, then see me in 2 weeks Physical in 1 year, pap in 3 if normal

## 2015-12-27 NOTE — Patient Instructions (Signed)
Menorrhagia Menorrhagia is the medical term for when your menstrual periods are heavy or last longer than usual. With menorrhagia, every period you have may cause enough blood loss and cramping that you are unable to maintain your usual activities. CAUSES  In some cases, the cause of heavy periods is unknown, but a number of conditions may cause menorrhagia. Common causes include:  A problem with the hormone-producing thyroid gland (hypothyroid).  Noncancerous growths in the uterus (polyps or fibroids).  An imbalance of the estrogen and progesterone hormones.  One of your ovaries not releasing an egg during one or more months.  Side effects of having an intrauterine device (IUD).  Side effects of some medicines, such as anti-inflammatory medicines or blood thinners.  A bleeding disorder that stops your blood from clotting normally. SIGNS AND SYMPTOMS  During a normal period, bleeding lasts between 4 and 8 days. Signs that your periods are too heavy include:  You routinely have to change your pad or tampon every 1 or 2 hours because it is completely soaked.  You pass blood clots larger than 1 inch (2.5 cm) in size.  You have bleeding for more than 7 days.  You need to use pads and tampons at the same time because of heavy bleeding.  You need to wake up to change your pads or tampons during the night.  You have symptoms of anemia, such as tiredness, fatigue, or shortness of breath. DIAGNOSIS  Your health care provider will perform a physical exam and ask you questions about your symptoms and menstrual history. Other tests may be ordered based on what the health care provider finds during the exam. These tests can include:  Blood tests. Blood tests are used to check if you are pregnant or have hormonal changes, a bleeding or thyroid disorder, low iron levels (anemia), or other problems.  Endometrial biopsy. Your health care provider takes a sample of tissue from the inside of your  uterus to be examined under a microscope.  Pelvic ultrasound. This test uses sound waves to make a picture of your uterus, ovaries, and vagina. The pictures can show if you have fibroids or other growths.  Hysteroscopy. For this test, your health care provider will use a small telescope to look inside your uterus. Based on the results of your initial tests, your health care provider may recommend further testing. TREATMENT  Treatment may not be needed. If it is needed, your health care provider may recommend treatment with one or more medicines first. If these do not reduce bleeding enough, a surgical treatment might be an option. The best treatment for you will depend on:   Whether you need to prevent pregnancy.  Your desire to have children in the future.  The cause and severity of your bleeding.  Your opinion and personal preference.  Medicines for menorrhagia may include:  Birth control methods that use hormones. These include the pill, skin patch, vaginal ring, shots that you get every 3 months, hormonal IUD, and implant. These treatments reduce bleeding during your menstrual period.  Medicines that thicken blood and slow bleeding.  Medicines that reduce swelling, such as ibuprofen.  Medicines that contain a synthetic hormone called progestin.   Medicines that make the ovaries stop working for a short time.  You may need surgical treatment for menorrhagia if the medicines are unsuccessful. Treatment options include:  Dilation and curettage (D&C). In this procedure, your health care provider opens (dilates) your cervix and then scrapes or suctions tissue from   the lining of your uterus to reduce menstrual bleeding. °· Operative hysteroscopy. This procedure uses a tiny tube with a light (hysteroscope) to view your uterine cavity and can help in the surgical removal of a polyp that may be causing heavy periods. °· Endometrial ablation. Through various techniques, your health care  provider permanently destroys the entire lining of your uterus (endometrium). After endometrial ablation, most women have little or no menstrual flow. Endometrial ablation reduces your ability to become pregnant. °· Endometrial resection. This surgical procedure uses an electrosurgical wire loop to remove the lining of the uterus. This procedure also reduces your ability to become pregnant. °· Hysterectomy. Surgical removal of the uterus and cervix is a permanent procedure that stops menstrual periods. Pregnancy is not possible after a hysterectomy. This procedure requires anesthesia and hospitalization. °HOME CARE INSTRUCTIONS  °· Only take over-the-counter or prescription medicines as directed by your health care provider. Take prescribed medicines exactly as directed. Do not change or switch medicines without consulting your health care provider. °· Take any prescribed iron pills exactly as directed by your health care provider. Long-term heavy bleeding may result in low iron levels. Iron pills help replace the iron your body lost from heavy bleeding. Iron may cause constipation. If this becomes a problem, increase the bran, fruits, and roughage in your diet. °· Do not take aspirin or medicines that contain aspirin 1 week before or during your menstrual period. Aspirin may make the bleeding worse. °· If you need to change your sanitary pad or tampon more than once every 2 hours, stay in bed and rest as much as possible until the bleeding stops. °· Eat well-balanced meals. Eat foods high in iron. Examples are leafy green vegetables, meat, liver, eggs, and whole grain breads and cereals. Do not try to lose weight until the abnormal bleeding has stopped and your blood iron level is back to normal. °SEEK MEDICAL CARE IF:  °· You soak through a pad or tampon every 1 or 2 hours, and this happens every time you have a period. °· You need to use pads and tampons at the same time because you are bleeding so much. °· You  need to change your pad or tampon during the night. °· You have a period that lasts for more than 8 days. °· You pass clots bigger than 1 inch wide. °· You have irregular periods that happen more or less often than once a month. °· You feel dizzy or faint. °· You feel very weak or tired. °· You feel short of breath or feel your heart is beating too fast when you exercise. °· You have nausea and vomiting or diarrhea while you are taking your medicine. °· You have any problems that may be related to the medicine you are taking. °SEEK IMMEDIATE MEDICAL CARE IF:  °· You soak through 4 or more pads or tampons in 2 hours. °· You have any bleeding while you are pregnant. °MAKE SURE YOU:  °· Understand these instructions. °· Will watch your condition. °· Will get help right away if you are not doing well or get worse. °This information is not intended to replace advice given to you by your health care provider. Make sure you discuss any questions you have with your health care provider. °Document Released: 01/07/2005 Document Revised: 05/01/2015 Document Reviewed: 06/28/2012 °Elsevier Interactive Patient Education © 2017 [employer]. ° °

## 2015-12-28 ENCOUNTER — Ambulatory Visit (HOSPITAL_COMMUNITY): Payer: 59 | Admitting: Oncology

## 2015-12-28 ENCOUNTER — Other Ambulatory Visit (HOSPITAL_COMMUNITY): Payer: Self-pay | Admitting: Oncology

## 2015-12-28 ENCOUNTER — Encounter (HOSPITAL_COMMUNITY): Payer: Self-pay | Admitting: Oncology

## 2015-12-28 ENCOUNTER — Encounter (HOSPITAL_BASED_OUTPATIENT_CLINIC_OR_DEPARTMENT_OTHER): Payer: 59 | Admitting: Oncology

## 2015-12-28 ENCOUNTER — Other Ambulatory Visit (HOSPITAL_COMMUNITY): Payer: 59

## 2015-12-28 VITALS — BP 130/70 | HR 85 | Temp 98.5°F | Resp 16 | Wt 194.0 lb

## 2015-12-28 DIAGNOSIS — D539 Nutritional anemia, unspecified: Secondary | ICD-10-CM | POA: Diagnosis not present

## 2015-12-28 DIAGNOSIS — D508 Other iron deficiency anemias: Secondary | ICD-10-CM

## 2015-12-28 NOTE — Patient Instructions (Signed)
[place] at [facility] Discharge Instructions  RECOMMENDATIONS MADE BY THE CONSULTANT AND ANY TEST RESULTS WILL BE SENT TO YOUR REFERRING PHYSICIAN.  Follow up with Dr. Oliva Bustard. We are going to set you up for 2 IV iron infusions.   You will return to the clinic in 3 months with labs.    Thank you for choosing [place] at [facility] to provide your oncology and hematology care.  To afford each patient quality time with our provider, please arrive at least 15 minutes before your scheduled appointment time.   Beginning January 23rd 2017 lab work for the [facility] will be done in the  Main lab at [facility] on 1st floor. If you have a lab appointment with the [facility] please come in thru the  Main Entrance and check in at the main information desk  You need to re-schedule your appointment should you arrive 10 or more minutes late.  We strive to give you quality time with our providers, and arriving late affects you and other patients whose appointments are after yours.  Also, if you no show three or more times for appointments you may be dismissed from the clinic at the providers discretion.     Again, thank you for choosing [facility]  Our hope is that these requests will decrease the amount of time that you wait before being seen by our physicians.       _____________________________________________________________  Should you have questions after your visit to [facility], please contact our office at [phone] between the hours of 8:30 a.m. and 4:30 p.m.  Voicemails left after 4:30 p.m. will not be returned until the following business day.  For prescription refill requests, have your pharmacy contact our office.         Resources For Cancer Patients and their Caregivers ? American Cancer Society: Can assist with transportation, wigs, general needs, runs Look Good Feel Better.         ([phone] ? Cancer Care: Provides financial assistance, online support groups, medication/co-pay assistance.  1-800-813-HOPE 917-371-8451) ? [facility] Assists [place] cancer patients and their families through emotional , educational and financial support.  (208)399-7117 ? [place] DSS Where to apply for food stamps, Medicaid and utility assistance. 508 463 5432 ? RCATS: Transportation to medical appointments. 548-848-4245 ? Social Security Administration: May apply for disability if have a Stage IV cancer. 407-864-4328 (302)149-5924 ? [employer], Disability and Transit Services: Assists with nutrition, care and transit needs. [place] Support Programs: @10RELATIVEDAYS @ > Cancer Support Group  2nd Tuesday of the month 1pm-2pm, Journey Room  > Creative Journey  3rd Tuesday of the month 1130am-1pm, Journey Room  > Look Good Feel Better  1st Wednesday of the month 10am-12 noon, Journey Room (Call [place] to register [phone])

## 2015-12-28 NOTE — Progress Notes (Signed)
[place] @ [facility] [phone]  Fax:([phone]) [place] OB: [birth year]  MR#: [id number]  [id number]  Patient Care Team: Jalene Mullet, PA-C as PCP - General (General Practice)  CHIEF COMPLAINT:  Chief Complaint  Patient presents with  . Follow-up    VISIT DIAGNOSIS:     ICD-9-CM ICD-10-CM   1. Other iron deficiency anemia 280.8 D50.8 CBC with Differential      No history exists.    No flowsheet data found.  INTERVAL HISTORY:   30 year old lady who presented with hemoglobin of 7.4. Has macrocytic anemia Patient has a heavy menstrual cycle for last several years Feeling weak and tired.  Patient has recurrent sinus infection recently has been treated with amoxicillin and prednisone Patient has occasional rectal bleeding and not been investigated any further at present time Patient had some poor tolerance to oral iron and taking intermittently with abdominal upset Hemoglobin is somehow improved Was evaluated by GYN physician and treatment being offered for menorrhagia. Due to intermittent rectal bleeding we have recommended GI evaluation  REVIEW OF SYSTEMS:   GENERAL patient is feeling weak and tired.  No fevers, sweats or weight loss. PERFORMANCE STATUS (ECOG):  0 HEENT: Recurrent sinus infection.  Multiple emergency room visits.  Recently on amoxicillin and prednisone Lungs: Or redness of breath on exertion Cardiac:  No chest pain, palpitations, orthopnea, or PND. GI:  No nausea, vomiting, diarrhea, constipation, melena or hematochezia. GU:  No urgency, frequency, dysuria, or hematuria. Heavy menstrual cycle for last several months Musculoskeletal:  No back pain.  No joint pain.  No muscle tenderness. Extremities:  No pain or swelling. Skin:  No rashes or skin changes. Neuro:  No headache, numbness or weakness, balance or coordination issues. Endocrine:  No diabetes, thyroid issues, hot flashes or night sweats. Psych:  No  mood changes, depression or anxiety. Pain:  No focal pain.  Review of systems:  All other systems reviewed and found to be negative. As per HPI. Otherwise, a complete review of systems is negatve.  PAST MEDICAL HISTORY: Past Medical History:  Diagnosis Date  . Allergy   . Anemia   . Anxiety   . Hypothyroidism   . Thyroid disease     PAST SURGICAL HISTORY: Past Surgical History:  Procedure Laterality Date  . CHOLECYSTECTOMY    . TONSILLECTOMY      FAMILY HISTORY Family History  Problem Relation Age of Onset  . Pulmonary embolism Mother   . Early death Mother   . Cancer Father     stomach  . Early death Father   . Asthma Son   . Allergies Son   . Heart disease Maternal Uncle   . Cancer Maternal Grandmother     uterine  . Cancer Maternal Grandfather     colon    GYNECOLOGIC HISTORY:  Patient's last menstrual period was 12/27/2015.     ADVANCED DIRECTIVES:  Patient does not have any living will or healthcare power of attorney.  Information was given .  Available resources had been discussed.  We will follow-up on subsequent appointments regarding this issue  HEALTH MAINTENANCE: Social History  Substance Use Topics  . Smoking status: Never Smoker  . Smokeless tobacco: Never Used  . Alcohol use No     Colonoscopy:  PAP:  Bone density:  Lipid panel:  No Known Allergies  Current Outpatient Prescriptions  Medication Sig Dispense Refill  . amoxicillin (AMOXIL) 875 MG tablet Take 1 tablet (875  mg total) by mouth 2 (two) times daily. 20 tablet 0  . ferrous sulfate 325 (65 FE) MG EC tablet Take 1 tablet (325 mg total) by mouth 2 (two) times daily. 100 tablet 3  . megestrol (MEGACE) 40 MG tablet Take 3 x 5 days then 2 x 5 days then 1 daily with 1 refill 45 tablet 3  . predniSONE (DELTASONE) 20 MG tablet Take 1 tablet (20 mg total) by mouth daily with breakfast. 10 tablet 0   No current facility-administered medications for this visit.     OBJECTIVE: PHYSICAL  EXAM: GENERAL:  Well developed, well nourished, sitting comfortably in the exam room in no acute distress.  MENTAL STATUS:  Alert and oriented to person, place and time. HEAD: face symmetric, no Cushingoid features.  Periorbital swelling secondary to steroid EYES: pale  looking conjunctiva Pupils equal round and reactive to light and accomodation.  No conjunctivitis or scleral icterus. ENT:  Oropharynx clear without lesion.  Tongue normal. Mucous membranes moist.  RESPIRATORY:  Clear to auscultation without rales, wheezes or rhonchi. CARDIOVASCULAR:  Regular rate and rhythm without murmur, rub or gallop.  Tachycardia BREAST:  Right breast without masses, skin changes or nipple discharge.  Left breast without masses, skin changes or nipple discharge. ABDOMEN:  Soft, non-tender, with active bowel sounds, and no hepatosplenomegaly.  No masses. BACK:  No CVA tenderness.  No tenderness on percussion of the back or rib cage. SKIN:  No rashes, ulcers or lesions. EXTREMITIES: No edema, no skin discoloration or tenderness.  No palpable cords. LYMPH NODES: No palpable cervical, supraclavicular, axillary or inguinal adenopathy  NEUROLOGICAL: Unremarkable. PSYCH:  Appropriate. Breast was not examined Vitals:   12/28/15 1114  BP: 130/70  Pulse: 85  Resp: 16  Temp: 98.5 F (36.9 C)     Body mass index is 34.37 kg/m.    ECOG FS:1 - Symptomatic but completely ambulatory  LAB RESULTS:  Appointment on 12/27/2015  Component Date Value Ref Range Status  . WBC 12/27/2015 11.0* 4.0 - 10.5 K/uL Final  . RBC 12/27/2015 5.04  3.87 - 5.11 MIL/uL Final  . Hemoglobin 12/27/2015 8.4* 12.0 - 15.0 g/dL Final  . HCT 12/27/2015 30.2* 36.0 - 46.0 % Final  . MCV 12/27/2015 59.9* 78.0 - 100.0 fL Final  . MCH 12/27/2015 16.7* 26.0 - 34.0 pg Final  . MCHC 12/27/2015 27.8* 30.0 - 36.0 g/dL Final  . RDW 12/27/2015 22.6* 11.5 - 15.5 % Final  . Platelets 12/27/2015 323  150 - 400 K/uL Final   Comment: SPECIMEN  CHECKED FOR CLOTS PLATELET COUNT CONFIRMED BY SMEAR   . Neutrophils Relative % 12/27/2015 90  % Final  . Neutro Abs 12/27/2015 9.9* 1.7 - 7.7 K/uL Final  . Lymphocytes Relative 12/27/2015 8  % Final  . Lymphs Abs 12/27/2015 0.9  0.7 - 4.0 K/uL Final  . Monocytes Relative 12/27/2015 2  % Final  . Monocytes Absolute 12/27/2015 0.2  0.1 - 1.0 K/uL Final  . Eosinophils Relative 12/27/2015 0  % Final  . Eosinophils Absolute 12/27/2015 0.0  0.0 - 0.7 K/uL Final  . Basophils Relative 12/27/2015 0  % Final  . Basophils Absolute 12/27/2015 0.0  0.0 - 0.1 K/uL Final  . RBC Morphology 12/27/2015 POLYCHROMASIA PRESENT   Final  . Ferritin 12/27/2015 8* 11 - 307 ng/mL Final  . Iron 12/27/2015 135  28 - 170 ug/dL Final  . TIBC 12/27/2015 503* 250 - 450 ug/dL Final  . Saturation Ratios 12/27/2015 27  10.4 -  31.8 % Final  . UIBC 12/27/2015 368  ug/dL Final     STUDIES: No results found.  ASSESSMENT:  Macrocytic anemia most likely secondary to iron deficiency anemia Blood loss maybe secondary to heavy menstrual cycle however other causes cannot be ruled out.  Patient had occasional rectal bleeding and may need further workup. He should not has significant iron deficiency anemia that is some improvement with oral iron therapy however patient has intermittent abdominal discomfort so intravenous iron would help to get hemoglobin improved beyond 10 g in this lady  Even though rectal bleeding is inconsequential she needs to be evaluated by a gastroenterologist for possibility of colonoscopy or sigmoidoscopy She will be reevaluated here in 2 months with repeat hemoglobin Plan has been made for intravenous Feraheme therapy   PLAN:  No problem-specific Assessment & Plan notes found for this encounter.   Patient expressed understanding and was in agreement with this plan. She also understands that She can call clinic at any time with any questions, concerns, or complaints.    No matching staging  information was found for the patient.  Forest Gleason, MD   12/28/2015 11:26 AM

## 2015-12-29 LAB — CYTOLOGY - PAP
Chlamydia: NEGATIVE
Diagnosis: NEGATIVE
HPV: NOT DETECTED
NEISSERIA GONORRHEA: NEGATIVE

## 2016-01-01 ENCOUNTER — Ambulatory Visit (INDEPENDENT_AMBULATORY_CARE_PROVIDER_SITE_OTHER): Payer: 59 | Admitting: Otolaryngology

## 2016-01-01 DIAGNOSIS — H93293 Other abnormal auditory perceptions, bilateral: Secondary | ICD-10-CM

## 2016-01-01 DIAGNOSIS — H6983 Other specified disorders of Eustachian tube, bilateral: Secondary | ICD-10-CM | POA: Diagnosis not present

## 2016-01-02 ENCOUNTER — Ambulatory Visit (INDEPENDENT_AMBULATORY_CARE_PROVIDER_SITE_OTHER): Payer: 59 | Admitting: Family Medicine

## 2016-01-02 ENCOUNTER — Encounter: Payer: Self-pay | Admitting: Family Medicine

## 2016-01-02 ENCOUNTER — Other Ambulatory Visit: Payer: 59

## 2016-01-02 VITALS — BP 124/70 | HR 100 | Temp 98.1°F | Resp 18 | Ht 63.0 in | Wt 194.1 lb

## 2016-01-02 DIAGNOSIS — D649 Anemia, unspecified: Secondary | ICD-10-CM | POA: Diagnosis not present

## 2016-01-02 DIAGNOSIS — N921 Excessive and frequent menstruation with irregular cycle: Secondary | ICD-10-CM | POA: Diagnosis not present

## 2016-01-02 NOTE — Patient Instructions (Signed)
Take the iron  Eat well and get enough exercise Go for the GI evaluation Call me for problems

## 2016-01-02 NOTE — Progress Notes (Signed)
    Chief Complaint  Patient presents with  . Follow-up    2 week, lab rev  here to follow up on abnormal lab testing Was found to be significantly anemic Hematology consulted due to suspicion of thalassemia She has microcytic anemia now thought to be iron deficiency due to menorrhagia She is scheduled to see GI for workup due to complaint of occas rectal bleeding.  Has had a prior colonoscopy years ago that was normal. Is feeling a bit better Occasional low back pain PAP and GYN evaluation were normal.  Pelvic US is pending    Patient Active Problem List   Diagnosis Date Noted  . Thyroid disorder 12/18/2015  . Interstitial cystitis 12/18/2015  . Anemia 12/18/2015  . Functional ovarian cysts 12/18/2015  . Menorrhagia 12/18/2015  . Class 1 obesity due to excess calories with body mass index (BMI) of 34.0 to 34.9 in adult 12/18/2015  . Environmental allergies 12/18/2015    Outpatient Encounter Prescriptions as of 01/02/2016  Medication Sig  . ferrous sulfate 325 (65 FE) MG EC tablet Take 1 tablet (325 mg total) by mouth 2 (two) times daily.  . fluticasone (FLONASE) 50 MCG/ACT nasal spray Place 1 spray into both nostrils daily.  . megestrol (MEGACE) 40 MG tablet Take 3 x 5 days then 2 x 5 days then 1 daily with 1 refill   No facility-administered encounter medications on file as of 01/02/2016.    No Known Allergies  Review of Systems  Constitutional: Positive for fatigue.  HENT: Negative.   Respiratory: Negative.   Cardiovascular: Negative.   Gastrointestinal: Positive for blood in stool. Negative for constipation and diarrhea.  Genitourinary: Positive for menstrual problem. Negative for difficulty urinating, hematuria and vaginal discharge.  Musculoskeletal: Positive for back pain. Negative for arthralgias and gait problem.  Neurological: Negative for dizziness and headaches.  All other systems reviewed and are negative.    BP 124/70 (BP Location: Right Arm, Patient  Position: Sitting, Cuff Size: Normal)   Pulse 100   Temp 98.1 F (36.7 C) (Oral)   Resp 18   Ht 5\' 3"  (1.6 m)   Wt 194 lb 1.9 oz (88.1 kg)   LMP 12/27/2015   SpO2 100%   BMI 34.39 kg/m   Physical Exam  Constitutional: She is oriented to person, place, and time. She appears well-developed and well-nourished. No distress.  HENT:  Head: Normocephalic and atraumatic.  Mouth/Throat: Oropharynx is clear and moist.  Eyes: Conjunctivae are normal. Pupils are equal, round, and reactive to light.  Neck: Normal range of motion. No thyromegaly present.  Cardiovascular: Normal rate, regular rhythm and normal heart sounds.   Pulmonary/Chest: Effort normal and breath sounds normal.  Neurological: She is alert and oriented to person, place, and time.  Skin: Skin is warm and dry. No pallor.  Psychiatric: Her behavior is normal. Thought content normal.    ASSESSMENT/PLAN:  1. Menorrhagia with irregular cycle On megace with [place] pending  2. Anemia, unspecified type Workup is in progress   Patient Instructions  Take the iron  Eat well and get enough exercise Go for the GI evaluation Call me for problems    Raylene Everts, MD

## 2016-01-03 ENCOUNTER — Ambulatory Visit (HOSPITAL_COMMUNITY): Payer: 59

## 2016-01-04 ENCOUNTER — Ambulatory Visit (INDEPENDENT_AMBULATORY_CARE_PROVIDER_SITE_OTHER): Payer: 59

## 2016-01-04 DIAGNOSIS — N946 Dysmenorrhea, unspecified: Secondary | ICD-10-CM | POA: Diagnosis not present

## 2016-01-04 DIAGNOSIS — D252 Subserosal leiomyoma of uterus: Secondary | ICD-10-CM

## 2016-01-04 DIAGNOSIS — N920 Excessive and frequent menstruation with regular cycle: Secondary | ICD-10-CM

## 2016-01-04 DIAGNOSIS — N854 Malposition of uterus: Secondary | ICD-10-CM

## 2016-01-04 NOTE — Progress Notes (Signed)
PELVIC US TA/TV: anteverted uterus,with a 4.2 x 3.5 x 3.1 cm posterior right subserosal fibroid,thickened endometrium 21 mm,normal ov's bilat,no free fluid,no pain during ultrasound,ov's appear mobile

## 2016-01-05 ENCOUNTER — Encounter (HOSPITAL_COMMUNITY): Payer: Self-pay

## 2016-01-05 ENCOUNTER — Encounter (HOSPITAL_BASED_OUTPATIENT_CLINIC_OR_DEPARTMENT_OTHER): Payer: 59

## 2016-01-05 VITALS — BP 118/69 | HR 80 | Temp 98.6°F | Resp 16

## 2016-01-05 DIAGNOSIS — N920 Excessive and frequent menstruation with regular cycle: Secondary | ICD-10-CM

## 2016-01-05 DIAGNOSIS — D500 Iron deficiency anemia secondary to blood loss (chronic): Secondary | ICD-10-CM

## 2016-01-05 MED ORDER — SODIUM CHLORIDE 0.9 % IV SOLN
Freq: Once | INTRAVENOUS | Status: AC
  Administered 2016-01-05: 14:00:00 via INTRAVENOUS

## 2016-01-05 MED ORDER — SODIUM CHLORIDE 0.9 % IV SOLN
510.0000 mg | Freq: Once | INTRAVENOUS | Status: AC
  Administered 2016-01-05: 510 mg via INTRAVENOUS
  Filled 2016-01-05: qty 17

## 2016-01-05 NOTE — Patient Instructions (Signed)
[facility] at [facility] Discharge Instructions  RECOMMENDATIONS MADE BY THE CONSULTANT AND ANY TEST RESULTS WILL BE SENT TO YOUR REFERRING PHYSICIAN.  Feraheme given today. Follow up as scheduled.  Thank you for choosing [facility] at [facility] to provide your oncology and hematology care.  To afford each patient quality time with our provider, please arrive at least 15 minutes before your scheduled appointment time.   Beginning January 23rd 2017 lab work for the Cancer Center will be done in the  Main lab at [facility] on 1st floor. If you have a lab appointment with the Cancer Center please come in thru the  Main Entrance and check in at the main information desk  You need to re-schedule your appointment should you arrive 10 or more minutes late.  We strive to give you quality time with our providers, and arriving late affects you and other patients whose appointments are after yours.  Also, if you no show three or more times for appointments you may be dismissed from the clinic at the providers discretion.     Again, thank you for choosing [facility].  Our hope is that these requests will decrease the amount of time that you wait before being seen by our physicians.       _____________________________________________________________  Should you have questions after your visit to [facility], please contact our office at [phone] between the hours of 8:30 a.m. and 4:30 p.m.  Voicemails left after 4:30 p.m. will not be returned until the following business day.  For prescription refill requests, have your pharmacy contact our office.         Resources For Cancer Patients and their Caregivers ? American Cancer Society: Can assist with transportation, wigs, general needs, runs Look Good Feel Better.        1-888-227-6333 ? Cancer Care: Provides financial assistance, online support groups,  medication/co-pay assistance.  1-800-813-HOPE (4673) ? [facility] Assists [place] cancer patients and their families through emotional , educational and financial support.  336-427-4357 ? [place] DSS Where to apply for food stamps, Medicaid and utility assistance. 336-342-1394 ? RCATS: Transportation to medical appointments. 336-347-2287 ? Social Security Administration: May apply for disability if have a Stage IV cancer. 336-342-7796 1-800-772-1213 ? [place] Aging, Disability and Transit Services: Assists with nutrition, care and transit needs. 336-349-2343  Cancer Center Support Programs: @10RELATIVEDAYS@ > Cancer Support Group  2nd Tuesday of the month 1pm-2pm, Journey Room  > Creative Journey  3rd Tuesday of the month 1130am-1pm, Journey Room  > Look Good Feel Better  1st Wednesday of the month 10am-12 noon, Journey Room (Call [employer] to register 1-800-395-5775)   

## 2016-01-05 NOTE — Progress Notes (Signed)
Toler

## 2016-01-05 NOTE — Progress Notes (Signed)
IV Feraheme given, patient tolerated it well, no problems. Vitals stable and discharged ambulatory from clinic.follow up as scheduled.

## 2016-01-08 ENCOUNTER — Encounter: Payer: Self-pay | Admitting: Gastroenterology

## 2016-01-08 ENCOUNTER — Encounter: Payer: Self-pay | Admitting: Adult Health

## 2016-01-08 ENCOUNTER — Telehealth: Payer: Self-pay | Admitting: Adult Health

## 2016-01-08 DIAGNOSIS — D252 Subserosal leiomyoma of uterus: Principal | ICD-10-CM

## 2016-01-08 DIAGNOSIS — D250 Submucous leiomyoma of uterus: Secondary | ICD-10-CM

## 2016-01-08 DIAGNOSIS — D219 Benign neoplasm of connective and other soft tissue, unspecified: Secondary | ICD-10-CM | POA: Insufficient documentation

## 2016-01-08 HISTORY — DX: Benign neoplasm of connective and other soft tissue, unspecified: D21.9

## 2016-01-08 NOTE — Telephone Encounter (Signed)
Aware US showed fibroid and the megace has stopped her bleeding

## 2016-01-10 ENCOUNTER — Encounter: Payer: Self-pay | Admitting: Adult Health

## 2016-01-10 ENCOUNTER — Ambulatory Visit (INDEPENDENT_AMBULATORY_CARE_PROVIDER_SITE_OTHER): Payer: 59 | Admitting: Adult Health

## 2016-01-10 VITALS — BP 118/62 | HR 88 | Ht 63.0 in | Wt 196.0 lb

## 2016-01-10 DIAGNOSIS — R21 Rash and other nonspecific skin eruption: Secondary | ICD-10-CM | POA: Diagnosis not present

## 2016-01-10 DIAGNOSIS — N946 Dysmenorrhea, unspecified: Secondary | ICD-10-CM | POA: Diagnosis not present

## 2016-01-10 DIAGNOSIS — N920 Excessive and frequent menstruation with regular cycle: Secondary | ICD-10-CM

## 2016-01-10 DIAGNOSIS — D509 Iron deficiency anemia, unspecified: Secondary | ICD-10-CM

## 2016-01-10 NOTE — Progress Notes (Signed)
Subjective:     Patient ID: Hannah Osborn, female   DOB: 1985/08/24, 30 y.o.   MRN: [id number]  HPI Hannah Osborn is a 30 year old black female back in follow up of starting megace for menorrhagia and dysmenorrhea and bleeding has stopped.She had 1 iron infusion and is complaining of rash on arms.   Review of Systems +rash Reviewed past medical,surgical, social and family history. Reviewed medications and allergies.     Objective:   Physical Exam BP 118/62 (BP Location: Left Arm, Patient Position: Sitting, Cuff Size: Normal)   Pulse 88   Ht 5\' 3"  (1.6 m)   Wt 196 lb (88.9 kg)   LMP 12/27/2015   BMI 34.72 kg/m   PHQ 2 score 1.Skin warm and dry has thin random rash on arms, try Cortaid 10   Discussed continuing Megace for now and getting tubal and ablation and she wants to proceed with that.  Assessment:     1. Menorrhagia with regular cycle   2. Dysmenorrhea   3. Iron deficiency anemia, unspecified iron deficiency anemia type   4. Rash       Plan:     Continue megace  Review handouts on tubal and ablation Return 01/23/16 for pre op with Dr Elonda Husky

## 2016-01-11 ENCOUNTER — Encounter (HOSPITAL_COMMUNITY): Payer: Self-pay

## 2016-01-11 ENCOUNTER — Encounter (HOSPITAL_BASED_OUTPATIENT_CLINIC_OR_DEPARTMENT_OTHER): Payer: 59

## 2016-01-11 VITALS — BP 122/80 | HR 88 | Temp 98.3°F | Resp 16

## 2016-01-11 DIAGNOSIS — D500 Iron deficiency anemia secondary to blood loss (chronic): Secondary | ICD-10-CM | POA: Diagnosis not present

## 2016-01-11 DIAGNOSIS — N920 Excessive and frequent menstruation with regular cycle: Secondary | ICD-10-CM

## 2016-01-11 MED ORDER — SODIUM CHLORIDE 0.9 % IV SOLN
Freq: Once | INTRAVENOUS | Status: AC
  Administered 2016-01-11: 15:00:00 via INTRAVENOUS

## 2016-01-11 MED ORDER — SODIUM CHLORIDE 0.9 % IV SOLN
510.0000 mg | Freq: Once | INTRAVENOUS | Status: AC
  Administered 2016-01-11: 510 mg via INTRAVENOUS
  Filled 2016-01-11: qty 17

## 2016-01-11 NOTE — Progress Notes (Signed)
Patient tolerated infusion well.  VSS.  Patient ambulatory and stable upon discharge from clinic.   

## 2016-01-11 NOTE — Patient Instructions (Signed)
[facility] at [facility] Discharge Instructions  RECOMMENDATIONS MADE BY THE CONSULTANT AND ANY TEST RESULTS WILL BE SENT TO YOUR REFERRING PHYSICIAN.  IV iron today.    Thank you for choosing [facility] at [facility] to provide your oncology and hematology care.  To afford each patient quality time with our provider, please arrive at least 15 minutes before your scheduled appointment time.   Beginning January 23rd 2017 lab work for the Cancer Center will be done in the  Main lab at [place] on 1st floor. If you have a lab appointment with the Cancer Center please come in thru the  Main Entrance and check in at the main information desk  You need to re-schedule your appointment should you arrive 10 or more minutes late.  We strive to give you quality time with our providers, and arriving late affects you and other patients whose appointments are after yours.  Also, if you no show three or more times for appointments you may be dismissed from the clinic at the providers discretion.     Again, thank you for choosing [facility].  Our hope is that these requests will decrease the amount of time that you wait before being seen by our physicians.       _____________________________________________________________  Should you have questions after your visit to [facility], please contact our office at [phone] between the hours of 8:30 a.m. and 4:30 p.m.  Voicemails left after 4:30 p.m. will not be returned until the following business day.  For prescription refill requests, have your pharmacy contact our office.         Resources For Cancer Patients and their Caregivers ? American Cancer Society: Can assist with transportation, wigs, general needs, runs Look Good Feel Better.        1-888-227-6333 ? Cancer Care: Provides financial assistance, online support groups, medication/co-pay assistance.  1-800-813-HOPE  (4673) ? [facility] Assists [place] cancer patients and their families through emotional , educational and financial support.  336-427-4357 ? [place] DSS Where to apply for food stamps, Medicaid and utility assistance. 336-342-1394 ? RCATS: Transportation to medical appointments. 336-347-2287 ? Social Security Administration: May apply for disability if have a Stage IV cancer. 336-342-7796 1-800-772-1213 ? [place] Aging, Disability and Transit Services: Assists with nutrition, care and transit needs. 336-349-2343  Cancer Center Support Programs: @10RELATIVEDAYS@ > Cancer Support Group  2nd Tuesday of the month 1pm-2pm, Journey Room  > Creative Journey  3rd Tuesday of the month 1130am-1pm, Journey Room  > Look Good Feel Better  1st Wednesday of the month 10am-12 noon, Journey Room (Call [employer] to register 1-800-395-5775)    

## 2016-01-24 ENCOUNTER — Telehealth: Payer: Self-pay | Admitting: Adult Health

## 2016-01-24 NOTE — Telephone Encounter (Signed)
Having low pelvic pain today, some nausea and diarrhea at times,no fever, has appt Friday with Dr Elonda Husky, will move to tomorrow.

## 2016-01-25 ENCOUNTER — Encounter: Payer: Self-pay | Admitting: Obstetrics & Gynecology

## 2016-01-25 ENCOUNTER — Ambulatory Visit (INDEPENDENT_AMBULATORY_CARE_PROVIDER_SITE_OTHER): Payer: 59 | Admitting: Obstetrics & Gynecology

## 2016-01-25 VITALS — BP 124/68 | HR 94 | Wt 190.0 lb

## 2016-01-25 DIAGNOSIS — Z029 Encounter for administrative examinations, unspecified: Secondary | ICD-10-CM

## 2016-01-25 DIAGNOSIS — N946 Dysmenorrhea, unspecified: Secondary | ICD-10-CM | POA: Diagnosis not present

## 2016-01-25 DIAGNOSIS — D252 Subserosal leiomyoma of uterus: Secondary | ICD-10-CM

## 2016-01-25 DIAGNOSIS — Z3009 Encounter for other general counseling and advice on contraception: Secondary | ICD-10-CM | POA: Diagnosis not present

## 2016-01-25 DIAGNOSIS — N920 Excessive and frequent menstruation with regular cycle: Secondary | ICD-10-CM | POA: Diagnosis not present

## 2016-01-26 ENCOUNTER — Encounter: Payer: 59 | Admitting: Obstetrics & Gynecology

## 2016-01-29 ENCOUNTER — Other Ambulatory Visit: Payer: Self-pay | Admitting: Obstetrics & Gynecology

## 2016-01-29 ENCOUNTER — Telehealth: Payer: Self-pay | Admitting: Obstetrics & Gynecology

## 2016-01-29 NOTE — Patient Instructions (Signed)
Hannah Osborn  01/29/2016     @PREFPERIOPPHARMACY @   Your procedure is scheduled on  02/02/2016   Report to [facility] at  51  A.M.  Call this number if you have problems the morning of surgery:  [phone]   Remember:  Do not eat food or drink liquids after midnight.  Take these medicines the morning of surgery with A SIP OF WATER  none   Do not wear jewelry, make-up or nail polish.  Do not wear lotions, powders, or perfumes, or deoderant.  Do not shave 48 hours prior to surgery.  Men may shave face and neck.  Do not bring valuables to the hospital.  [facility] is not responsible for any belongings or valuables.  Contacts, dentures or bridgework may not be worn into surgery.  Leave your suitcase in the car.  After surgery it may be brought to your room.  For patients admitted to the hospital, discharge time will be determined by your treatment team.  Patients discharged the day of surgery will not be allowed to drive home.   Name and phone number of your driver:   family Special instructions:  none  Please read over the following fact sheets that you were given. Anesthesia Post-op Instructions and Care and Recovery After Surgery      Salpingectomy Salpingectomy, also called tubectomy, is the surgical removal of one of the fallopian tubes. The fallopian tubes are where eggs travel from the ovaries to the uterus. Removing one fallopian tube does not prevent you from becoming pregnant. It also does not cause problems with your menstrual periods. You may need a salpingectomy if you:  Have a fertilized egg that attaches to the fallopian tube (ectopic pregnancy), especially one that causes the tube to burst or tear (rupture).  Have an infected fallopian tube.  Have cancer of the fallopian tube or nearby organs.  Have had an ovary removed due to a cyst or tumor.  Have had your uterus removed. There are three different methods that  can be used for a salpingectomy:  Open. This method involves making one large incision in your abdomen.  Laparoscopic. This method involves using a thin, lighted tube with a tiny camera on the end (laparoscope) to help perform the procedure. The laparoscope will allow your surgeon to make several small incisions in the abdomen instead of a large incision.  Robot-assisted: This method involves using a computer to control surgical instruments that are attached to robotic arms. Tell a health care provider about:  Any allergies you have.  All medicines you are taking, including vitamins, herbs, eye drops, creams, and over-the-counter medicines.  Any problems you or family members have had with anesthetic medicines.  Any blood disorders you have.  Any surgeries you have had.  Any medical conditions you have.  Whether you are pregnant or may be pregnant. What are the risks? Generally, this is a safe procedure. However, problems may occur, including:  Infection.  Bleeding.  Allergic reactions to medicines.  Damage to other structures or organs.  Blood clots in the legs or lungs. What happens before the procedure? Staying hydrated  Follow instructions from your health care provider about hydration, which may include:  Up to 2 hours before the procedure - you may continue to drink clear liquids, such as water, clear fruit juice, black coffee, and plain tea. Eating and drinking  restrictions  Follow instructions from your health care provider about eating and drinking, which may include:  8 hours before the procedure - stop eating heavy meals or foods such as meat, fried foods, or fatty foods.  6 hours before the procedure - stop eating light meals or foods, such as toast or cereal.  6 hours before the procedure - stop drinking milk or drinks that contain milk.  2 hours before the procedure - stop drinking clear liquids. Medicines  Ask your health care provider  about:  Changing or stopping your regular medicines. This is especially important if you are taking diabetes medicines or blood thinners.  Taking medicines such as aspirin and ibuprofen. These medicines can thin your blood. Do not take these medicines before your procedure if your health care provider instructs you not to.  You may be given antibiotic medicine to help prevent infection. General instructions  Do not smoke for at least 2 weeks before your procedure. If you need help quitting, ask your health care provider.  You may have an exam or tests, such as an electrocardiogram (ECG).  You may have a blood or urine sample taken.  Ask your health care provider:  Whether you should stop removing hair from your surgical area.  How your surgical site will be marked or identified.  You may be asked to shower with a germ-killing soap.  Plan to have someone take you home from the hospital or clinic.  If you will be going home right after the procedure, plan to have someone with you for 24 hours. What happens during the procedure?  To reduce your risk of infection:  Your health care team will wash or sanitize their hands.  Hair may be removed from the surgical area.  Your skin will be washed with soap.  An IV tube will be inserted into one of your veins.  You will be given a medicine to make you fall asleep (general anesthetic). You may also be given a medicine to help you relax (sedative).  A thin tube (catheter) may be inserted through your urethra and into your bladder to drain urine during your procedure.  Depending on the type of procedure you are having, one incision or several small incisions will be made in your abdomen.  Your fallopian tube will be cut and removed from where it attaches to your uterus.  Your blood vessels will be clamped and tied to prevent excess bleeding.  The incision(s) in your abdomen will be closed with stitches (sutures), staples, or skin  glue.  A bandage (dressing) may be placed over your incision(s). The procedure may vary among health care providers and hospitals. What happens after the procedure?  Your blood pressure, heart rate, breathing rate, and blood oxygen level will be monitored until the medicines you were given have worn off.  You may continue to receive fluids and medicines through an IV tube.  You may continue to have a catheter draining your urine.  You may have to wear compression stockings. These stockings help to prevent blood clots and reduce swelling in your legs.  You will be given pain medicine as needed.  Do not drive for 24 hours if you received a sedative. Summary  Salpingectomy is a surgical procedure to remove one of the fallopian tubes.  The procedure may be done with an open incision, with a laparoscope, or with computer-controlled instruments.  Depending on the type of procedure you are having, one incision or several small incisions will  be made in your abdomen.  Your blood pressure, heart rate, breathing rate, and blood oxygen level will be monitored until the medicines you were given have worn off.  Plan to have someone take you home from the hospital or clinic. This information is not intended to replace advice given to you by your health care provider. Make sure you discuss any questions you have with your health care provider. Document Released: 05/26/2008 Document Revised: 08/25/2015 Document Reviewed: 07/01/2012 Elsevier Interactive Patient Education  2017 [place] After This sheet gives you information about how to care for yourself after your procedure. Your health care provider may also give you more specific instructions. If you have problems or questions, contact your health care provider. What can I expect after the procedure? After your procedure, it is common to have:  Pain in your abdomen.  Some occasional vaginal bleeding  (spotting).  Tiredness. Follow these instructions at home: Incision care  Keep your incision area and your bandage (dressing) clean and dry.  Follow instructions from your health care provider about how to take care of your incision. Make sure you:  Wash your hands with soap and water before you change your dressing. If soap and water are not available, use hand sanitizer.  Change your dressing astold by your health care provider.  Leave stitches (sutures), staples, skin glue, or adhesive strips in place. These skin closures may need to stay in place for 2 weeks or longer. If adhesive strip edges start to loosen and curl up, you may trim the loose edges. Do not remove adhesive strips completely unless your health care provider tells you to do that.  Check your incision area every day for signs of infection. Check for:  More redness, swelling, or pain.  More fluid or blood.  Warmth.  Pus or a bad smell. Activity  Do not drive or use heavy machinery while taking prescription pain medicine.  Do not drive for 24 hours if you received a medicine to help you relax (sedative).  Rest as directed by your health care provider. Ask your health care provider what activities are safe for you. You should avoid:  Lifting anything that is heavier than 10 lb (4.5 kg) until your health care provider approves.  Activities that require a lot of energy.  Until your health care provider approves:  Do not douche.  Do not use tampons.  Do not have sexual intercourse. General instructions  Take over-the-counter and prescription medicines only as told by your health care provider.  To prevent or treat constipation while you are taking prescription pain medicine, your health care provider may recommend that you:  Drink enough fluid to keep your urine clear or pale yellow.  Take over-the-counter or prescription medicines.  Eat foods that are high in fiber, such as fresh fruits and  vegetables, whole grains, and beans.  Limit foods that are high in fat and processed sugars, such as fried and sweet foods.  Do not take baths, swim, or use a hot tub until your health care provider approves. You may take showers.  Wear compression stockings as told by your health care provider. These stockings help to prevent blood clots and reduce swelling in your legs.  Keep all follow-up visits as told by your health care provider. This is important. Contact a health care provider if:  You have:  Pain when you urinate.  More redness, swelling, or pain around your incision.  More fluid or blood coming from  your incision.  Pus or a bad smell coming from your incision.  A fever.  Abdominal pain that gets worse or does not get better with medicine.  Your incision feels warm to the touch.  Your incision starts to break open.  You develop a rash.  You develop nausea and vomiting.  You feel light-headed. Get help right away if:  You develop pain in your chest or leg.  You develop shortness of breath.  You faint.  You have increased vaginal bleeding. This information is not intended to replace advice given to you by your health care provider. Make sure you discuss any questions you have with your health care provider. Document Released: 04/13/2010 Document Revised: 09/06/2015 Document Reviewed: 09/07/2015 Elsevier Interactive Patient Education  2017 [place]. Hysteroscopy Hysteroscopy is a procedure used for looking inside the womb (uterus). It may be done for various reasons, including:  To evaluate abnormal bleeding, fibroid (benign, noncancerous) tumors, polyps, scar tissue (adhesions), and possibly cancer of the uterus.  To look for lumps (tumors) and other uterine growths.  To look for causes of why a woman cannot get pregnant (infertility), causes of recurrent loss of pregnancy (miscarriages), or a lost intrauterine device (IUD).  To perform a  sterilization by blocking the fallopian tubes from inside the uterus. In this procedure, a thin, flexible tube with a tiny light and camera on the end of it (hysteroscope) is used to look inside the uterus. A hysteroscopy should be done right after a menstrual period to be sure you are not pregnant. LET [facility] CARE PROVIDER KNOW ABOUT:   Any allergies you have.  All medicines you are taking, including vitamins, herbs, eye drops, creams, and over-the-counter medicines.  Previous problems you or members of your family have had with the use of anesthetics.  Any blood disorders you have.  Previous surgeries you have had.  Medical conditions you have. RISKS AND COMPLICATIONS  Generally, this is a safe procedure. However, as with any procedure, complications can occur. Possible complications include:  Putting a hole in the uterus.  Excessive bleeding.  Infection.  Damage to the cervix.  Injury to other organs.  Allergic reaction to medicines.  Too much fluid used in the uterus for the procedure. BEFORE THE PROCEDURE   Ask your health care provider about changing or stopping any regular medicines.  Do not take aspirin or blood thinners for 1 week before the procedure, or as directed by your health care provider. These can cause bleeding.  If you smoke, do not smoke for 2 weeks before the procedure.  In some cases, a medicine is placed in the cervix the day before the procedure. This medicine makes the cervix have a larger opening (dilate). This makes it easier for the instrument to be inserted into the uterus during the procedure.  Do not eat or drink anything for at least 8 hours before the surgery.  Arrange for someone to take you home after the procedure. PROCEDURE   You may be given a medicine to relax you (sedative). You may also be given one of the following:  A medicine that numbs the area around the cervix (local anesthetic).  A medicine that makes you sleep  through the procedure (general anesthetic).  The hysteroscope is inserted through the vagina into the uterus. The camera on the hysteroscope sends a picture to a TV screen. This gives the surgeon a good view inside the uterus.  During the procedure, air or a liquid is put  into the uterus, which allows the surgeon to see better.  Sometimes, tissue is gently scraped from inside the uterus. These tissue samples are sent to a lab for testing. AFTER THE PROCEDURE   If you had a general anesthetic, you may be groggy for a couple hours after the procedure.  If you had a local anesthetic, you will be able to go home as soon as you are stable and feel ready.  You may have some cramping. This normally lasts for a couple days.  You may have bleeding, which varies from light spotting for a few days to menstrual-like bleeding for 3-7 days. This is normal.  If your test results are not back during the visit, make an appointment with your health care provider to find out the results. This information is not intended to replace advice given to you by your health care provider. Make sure you discuss any questions you have with your health care provider. Document Released: 04/15/2000 Document Revised: 10/28/2012 Document Reviewed: 08/06/2012 Elsevier Interactive Patient Education  2017 [place]. Hysteroscopy, Care After Refer to this sheet in the next few weeks. These instructions provide you with information on caring for yourself after your procedure. Your health care provider may also give you more specific instructions. Your treatment has been planned according to current medical practices, but problems sometimes occur. Call your health care provider if you have any problems or questions after your procedure.  WHAT TO EXPECT AFTER THE PROCEDURE After your procedure, it is typical to have the following:  You may have some cramping. This normally lasts for a couple days.  You may have bleeding. This  can vary from light spotting for a few days to menstrual-like bleeding for 3-7 days. HOME CARE INSTRUCTIONS  Rest for the first 1-2 days after the procedure.  Only take over-the-counter or prescription medicines as directed by your health care provider. Do not take aspirin. It can increase the chances of bleeding.  Take showers instead of baths for 2 weeks or as directed by your health care provider.  Do not drive for 24 hours or as directed.  Do not drink alcohol while taking pain medicine.  Do not use tampons, douche, or have sexual intercourse for 2 weeks or until your health care provider says it is okay.  Take your temperature twice a day for 4-5 days. Write it down each time.  Follow your health care provider's advice about diet, exercise, and lifting.  If you develop constipation, you may:  Take a mild laxative if your health care provider approves.  Add bran foods to your diet.  Drink enough fluids to keep your urine clear or pale yellow.  Try to have someone with you or available to you for the first 24-48 hours, especially if you were given a general anesthetic.  Follow up with your health care provider as directed. SEEK MEDICAL CARE IF:  You feel dizzy or lightheaded.  You feel sick to your stomach (nauseous).  You have abnormal vaginal discharge.  You have a rash.  You have pain that is not controlled with medicine. SEEK IMMEDIATE MEDICAL CARE IF:  You have bleeding that is heavier than a normal menstrual period.  You have a fever.  You have increasing cramps or pain, not controlled with medicine.  You have new belly (abdominal) pain.  You pass out.  You have pain in the tops of your shoulders (shoulder strap areas).  You have shortness of breath. This information is not intended  to replace advice given to you by your health care provider. Make sure you discuss any questions you have with your health care provider. Document Released: 10/28/2012  Document Reviewed: 10/28/2012 Elsevier Interactive Patient Education  2017 [place].  Endometrial Ablation Endometrial ablation removes the lining of the uterus (endometrium). It is usually a same-day, outpatient treatment. Ablation helps avoid major surgery, such as surgery to remove the cervix and uterus (hysterectomy). After endometrial ablation, you will have little or no menstrual bleeding and may not be able to have children. However, if you are premenopausal, you will need to use a reliable method of birth control following the procedure because of the small chance that pregnancy can occur. There are different reasons to have this procedure. These reasons include:  Heavy periods.  Bleeding that is causing anemia.  Irregular bleeding.  Bleeding fibroids on the lining inside the uterus if they are smaller than 3 centimeters. This procedure may not be possible for you if:   You want to have children in the future.   You have severe cramps with your menstrual period.   You have precancerous or cancerous cells in your uterus.   You were recently pregnant.   You have gone through menopause.   You have had major surgery on your uterus, resulting in thinning of the uterine wall. Surgeries may include:  The removal of one or more uterine fibroids (myomectomy).  A cesarean section with a classic (vertical) incision on your uterus. Ask your health care provider what type of cesarean you had. Sometimes the scar on your skin is different than the scar on your uterus. Even if you have had surgery on your uterus, certain types of ablation may still be safe for you. Talk with your health care provider. LET [facility] CARE PROVIDER KNOW ABOUT:  Any allergies you have.  All medicines you are taking, including vitamins, herbs, eye drops, creams, and over-the-counter medicines.  Previous problems you or members of your family have had with the use of anesthetics.  Any blood  disorders you have.  Previous surgeries you have had.  Medical conditions you have. RISKS AND COMPLICATIONS  Generally, this is a safe procedure. However, as with any procedure, complications can occur. Possible complications include:  Perforation of the uterus.  Bleeding.  Infection of the uterus, bladder, or vagina.  Injury to surrounding organs.  An air bubble to the lung (air embolus).  Pregnancy following the procedure.  Failure of the procedure to help the problem, requiring hysterectomy.  Decreased ability to diagnose cancer in the lining of the uterus. BEFORE THE PROCEDURE  The lining of the uterus must be tested to make sure there is no pre-cancerous or cancer cells present.  An ultrasound may be performed to look at the size of the uterus and to check for abnormalities.  Medicines may be given to thin the lining of the uterus. PROCEDURE  During the procedure, your health care provider will use a tool called a resectoscope to help see inside your uterus. There are different ways to remove the lining of your uterus.   Radiofrequency - This method uses a radiofrequency-alternating electric current to remove the lining of the uterus.  Cryotherapy - This method uses extreme cold to freeze the lining of the uterus.  Heated-Free Liquid - This method uses heated salt (saline) solution to remove the lining of the uterus.  Microwave - This method uses high-energy microwaves to heat up the lining of the uterus to remove it.  Thermal balloon - This method involves inserting a catheter with a balloon tip into the uterus. The balloon tip is filled with heated fluid to remove the lining of the uterus. AFTER THE PROCEDURE  After your procedure, do not have sexual intercourse or insert anything into your vagina until permitted by your health care provider. After the procedure, you may experience:  Cramps.  Vaginal discharge.  Frequent urination. This information is not  intended to replace advice given to you by your health care provider. Make sure you discuss any questions you have with your health care provider. Document Released: 11/17/2003 Document Revised: 09/28/2014 Document Reviewed: 06/10/2012 Elsevier Interactive Patient Education  2017 [employer]  Dilation and Curettage or Vacuum Curettage Dilation and curettage (D&C) and vacuum curettage are minor procedures. A D&C involves stretching (dilation) the cervix and scraping (curettage) the inside lining of the uterus (endometrium). During a D&C, tissue is gently scraped from the endometrium, starting from the top portion of the uterus down to the lowest part of the uterus (cervix). During a vacuum curettage, the lining and tissue in the uterus are removed with the use of gentle suction. Curettage may be performed to either diagnose or treat a problem. As a diagnostic procedure, curettage is performed to examine tissues from the uterus. A diagnostic curettage may be done if you have:  Irregular bleeding in the uterus.  Bleeding with the development of clots.  Spotting between menstrual periods.  Prolonged menstrual periods or other abnormal bleeding.  Bleeding after menopause.  No menstrual period (amenorrhea).  A change in size and shape of the uterus.  Abnormal endometrial cells discovered during a Pap test. As a treatment procedure, curettage may be performed for the following reasons:  Removal of an IUD (intrauterine device).  Removal of retained placenta after giving birth.  Abortion.  Miscarriage.  Removal of endometrial polyps.  Removal of uncommon types of noncancerous lumps (fibroids). Tell a health care provider about:  Any allergies you have, including allergies to prescribed medicine or latex.  All medicines you are taking, including vitamins, herbs, eye drops, creams, and over-the-counter medicines. This is especially important if you take any blood-thinning medicine.  Bring a list of all of your medicines to your appointment.  Any problems you or family members have had with anesthetic medicines.  Any blood disorders you have.  Any surgeries you have had.  Your medical history and any medical conditions you have.  Whether you are pregnant or may be pregnant.  Recent vaginal infections you have had.  Recent menstrual periods, bleeding problems you have had, and what form of birth control (contraception) you use. What are the risks? Generally, this is a safe procedure. However, problems may occur, including:  Infection.  Heavy vaginal bleeding.  Allergic reactions to medicines.  Damage to the cervix or other structures or organs.  Development of scar tissue (adhesions) inside the uterus, which can cause abnormal amounts of menstrual bleeding. This may make it harder to get pregnant in the future.  A hole (perforation) or puncture in the uterine wall. This is rare. What happens before the procedure? Staying hydrated  Follow instructions from your health care provider about hydration, which may include:  Up to 2 hours before the procedure - you may continue to drink clear liquids, such as water, clear fruit juice, black coffee, and plain tea. Eating and drinking restrictions  Follow instructions from your health care provider about eating and drinking, which may include:  8 hours before the  procedure - stop eating heavy meals or foods such as meat, fried foods, or fatty foods.  6 hours before the procedure - stop eating light meals or foods, such as toast or cereal.  6 hours before the procedure - stop drinking milk or drinks that contain milk.  2 hours before the procedure - stop drinking clear liquids. If your health care provider told you to take your medicine(s) on the day of your procedure, take them with only a sip of water. Medicines  Ask your health care provider about:  Changing or stopping your regular medicines. This is  especially important if you are taking diabetes medicines or blood thinners.  Taking medicines such as aspirin and ibuprofen. These medicines can thin your blood. Do not take these medicines before your procedure if your health care provider instructs you not to.  You may be given antibiotic medicine to help prevent infection. General instructions  For 24 hours before your procedure, do not:  Douche.  Use tampons.  Use medicines, creams, or suppositories in the vagina.  Have sexual intercourse.  You may be given a pregnancy test on the day of the procedure.  Plan to have someone take you home from the hospital or clinic.  You may have a blood or urine sample taken.  If you will be going home right after the procedure, plan to have someone with you for 24 hours. What happens during the procedure?  To reduce your risk of infection:  Your health care team will wash or sanitize their hands.  Your skin will be washed with soap.  An IV tube will be inserted into one of your veins.  You will be given one of the following:  A medicine that numbs the area in and around the cervix (local anesthetic).  A medicine to make you fall asleep (general anesthetic).  You will lie down on your back, with your feet in foot rests (stirrups).  The size and position of your uterus will be checked.  A lubricated instrument (speculum or Sims retractor) will be inserted into the back side of your vagina. The speculum will be used to hold apart the walls of your vagina so your health care provider can see your cervix.  A tool (tenaculum) will be attached to the lip of the cervix to stabilize it.  Your cervix will be softened and dilated. This may be done by:  Taking a medicine.  Having tapered dilators or thin rods (laminaria) or gradual widening instruments (tapered dilators) inserted into your cervix.  A small, sharp, curved instrument (curette) will be used to scrape a small amount of  tissue or cells from the endometrium or cervical canal. In some cases, gentle suction is applied with the curette. The curette will then be removed. The cells will be taken to a lab for testing. The procedure may vary among health care providers and hospitals. What happens after the procedure?  You may have mild cramping, backache, pain, and light bleeding or spotting. You may pass small blood clots from your vagina.  You may have to wear compression stockings. These stockings help to prevent blood clots and reduce swelling in your legs.  Your blood pressure, heart rate, breathing rate, and blood oxygen level will be monitored until the medicines you were given have worn off. Summary  Dilation and curettage (D&C) involves stretching (dilation) the cervix and scraping (curettage) the inside lining of the uterus (endometrium).  After the procedure, you may have mild cramping, backache,  pain, and light bleeding or spotting. You may pass small blood clots from your vagina.  Plan to have someone take you home from the hospital or clinic. This information is not intended to replace advice given to you by your health care provider. Make sure you discuss any questions you have with your health care provider. Document Released: 01/07/2005 Document Revised: 09/24/2015 Document Reviewed: 09/24/2015 Elsevier Interactive Patient Education  2017 [employer]  Dilation and Curettage or Vacuum Curettage, Care After These instructions give you information about caring for yourself after your procedure. Your doctor may also give you more specific instructions. Call your doctor if you have any problems or questions after your procedure. Follow these instructions at home: Activity  Do not drive or use heavy machinery while taking prescription pain medicine.  For 24 hours after your procedure, avoid driving.  Take short walks often, followed by rest periods. Ask your doctor what activities are safe for  you. After one or two days, you may be able to return to your normal activities.  Do not lift anything that is heavier than 10 lb (4.5 kg) until your doctor approves.  For at least 2 weeks, or as long as told by your doctor:  Do not douche.  Do not use tampons.  Do not have sex. General instructions  Take over-the-counter and prescription medicines only as told by your doctor. This is very important if you take blood thinning medicine.  Do not take baths, swim, or use a hot tub until your doctor approves. Take showers instead of baths.  Wear compression stockings as told by your doctor.  It is up to you to get the results of your procedure. Ask your doctor when your results will be ready.  Keep all follow-up visits as told by your doctor. This is important. Contact a doctor if:  You have very bad cramps that get worse or do not get better with medicine.  You have very bad pain in your belly (abdomen).  You cannot drink fluids without throwing up (vomiting).  You get pain in a different part of the area between your belly and thighs (pelvis).  You have bad-smelling discharge from your vagina.  You have a rash. Get help right away if:  You are bleeding a lot from your vagina. A lot of bleeding means soaking more than one sanitary pad in an hour, for 2 hours in a row.  You have clumps of blood (blood clots) coming from your vagina.  You have a fever or chills.  Your belly feels very tender or hard.  You have chest pain.  You have trouble breathing.  You cough up blood.  You feel dizzy.  You feel light-headed.  You pass out (faint).  You have pain in your neck or shoulder area. Summary  Take short walks often, followed by rest periods. Ask your doctor what activities are safe for you. After one or two days, you may be able to return to your normal activities.  Do not lift anything that is heavier than 10 lb (4.5 kg) until your doctor approves.  Do not take  baths, swim, or use a hot tub until your doctor approves. Take showers instead of baths.  Contact your doctor if you have any symptoms of infection, like bad-smelling discharge from your vagina. This information is not intended to replace advice given to you by your health care provider. Make sure you discuss any questions you have with your health care provider. Document Released:  10/17/2007 Document Revised: 09/25/2015 Document Reviewed: 09/25/2015 Elsevier Interactive Patient Education  2017 [place] Anesthesia, Adult General anesthesia is the use of medicines to make a person "go to sleep" (be unconscious) for a medical procedure. General anesthesia is often recommended when a procedure:  Is long.  Requires you to be still or in an unusual position.  Is major and can cause you to lose blood.  Is impossible to do without general anesthesia. The medicines used for general anesthesia are called general anesthetics. In addition to making you sleep, the medicines:  Prevent pain.  Control your blood pressure.  Relax your muscles. Tell a health care provider about:  Any allergies you have.  All medicines you are taking, including vitamins, herbs, eye drops, creams, and over-the-counter medicines.  Any problems you or family members have had with anesthetic medicines.  Types of anesthetics you have had in the past.  Any bleeding disorders you have.  Any surgeries you have had.  Any medical conditions you have.  Any history of heart or lung conditions, such as heart failure, sleep apnea, or chronic obstructive pulmonary disease (COPD).  Whether you are pregnant or may be pregnant.  Whether you use tobacco, alcohol, marijuana, or street drugs.  Any history of Armed forces logistics/support/administrative officer.  Any history of depression or anxiety. What are the risks? Generally, this is a safe procedure. However, problems may occur, including:  Allergic reaction to anesthetics.  Lung and  heart problems.  Inhaling food or liquids from your stomach into your lungs (aspiration).  Injury to nerves.  Waking up during your procedure and being unable to move (rare).  Extreme agitation or a state of mental confusion (delirium) when you wake up from the anesthetic.  Air in the bloodstream, which can lead to stroke. These problems are more likely to develop if you are having a major surgery or if you have an advanced medical condition. You can prevent some of these complications by answering all of your health care provider's questions thoroughly and by following all pre-procedure instructions. General anesthesia can cause side effects, including:  Nausea or vomiting  A sore throat from the breathing tube.  Feeling cold or shivery.  Feeling tired, washed out, or achy.  Sleepiness or drowsiness.  Confusion or agitation. What happens before the procedure? Staying hydrated  Follow instructions from your health care provider about hydration, which may include:  Up to 2 hours before the procedure - you may continue to drink clear liquids, such as water, clear fruit juice, black coffee, and plain tea. Eating and drinking restrictions  Follow instructions from your health care provider about eating and drinking, which may include:  8 hours before the procedure - stop eating heavy meals or foods such as meat, fried foods, or fatty foods.  6 hours before the procedure - stop eating light meals or foods, such as toast or cereal.  6 hours before the procedure - stop drinking milk or drinks that contain milk.  2 hours before the procedure - stop drinking clear liquids. Medicines  Ask your health care provider about:  Changing or stopping your regular medicines. This is especially important if you are taking diabetes medicines or blood thinners.  Taking medicines such as aspirin and ibuprofen. These medicines can thin your blood. Do not take these medicines before your  procedure if your health care provider instructs you not to.  Taking new dietary supplements or medicines. Do not take these during the week before your procedure  unless your health care provider approves them.  If you are told to take a medicine or to continue taking a medicine on the day of the procedure, take the medicine with sips of water. General instructions   Ask if you will be going home the same day, the following day, or after a longer hospital stay.  Plan to have someone take you home.  Plan to have someone stay with you for the first 24 hours after you leave the hospital or clinic.  For 3-6 weeks before the procedure, try not to use any tobacco products, such as cigarettes, chewing tobacco, and e-cigarettes.  You may brush your teeth on the morning of the procedure, but make sure to spit out the toothpaste. What happens during the procedure?  You will be given anesthetics through a mask and through an IV tube in one of your veins.  You may receive medicine to help you relax (sedative).  As soon as you are asleep, a breathing tube may be used to help you breathe.  An anesthesia specialist will stay with you throughout the procedure. He or she will help keep you comfortable and safe by continuing to give you medicines and adjusting the amount of medicine that you get. He or she will also watch your blood pressure, pulse, and oxygen levels to make sure that the anesthetics do not cause any problems.  If a breathing tube was used to help you breathe, it will be removed before you wake up. The procedure may vary among health care providers and hospitals. What happens after the procedure?  You will wake up, often slowly, after the procedure is complete, usually in a recovery area.  Your blood pressure, heart rate, breathing rate, and blood oxygen level will be monitored until the medicines you were given have worn off.  You may be given medicine to help you calm down if you  feel anxious or agitated.  If you will be going home the same day, your health care provider may check to make sure you can stand, drink, and urinate.  Your health care providers will treat your pain and side effects before you go home.  Do not drive for 24 hours if you received a sedative.  You may:  Feel nauseous and vomit.  Have a sore throat.  Have mental slowness.  Feel cold or shivery.  Feel sleepy.  Feel tired.  Feel sore or achy, even in parts of your body where you did not have surgery. This information is not intended to replace advice given to you by your health care provider. Make sure you discuss any questions you have with your health care provider. Document Released: 04/16/2007 Document Revised: 06/20/2015 Document Reviewed: 12/22/2014 Elsevier Interactive Patient Education  2017 [place] Anesthesia, Adult, Care After These instructions provide you with information about caring for yourself after your procedure. Your health care provider may also give you more specific instructions. Your treatment has been planned according to current medical practices, but problems sometimes occur. Call your health care provider if you have any problems or questions after your procedure. What can I expect after the procedure? After the procedure, it is common to have:  Vomiting.  A sore throat.  Mental slowness. It is common to feel:  Nauseous.  Cold or shivery.  Sleepy.  Tired.  Sore or achy, even in parts of your body where you did not have surgery. Follow these instructions at home: For at least 24 hours after the  procedure:  Do not:  Participate in activities where you could fall or become injured.  Drive.  Use heavy machinery.  Drink alcohol.  Take sleeping pills or medicines that cause drowsiness.  Make important decisions or sign legal documents.  Take care of children on your own.  Rest. Eating and drinking  If you vomit, drink  water, juice, or soup when you can drink without vomiting.  Drink enough fluid to keep your urine clear or pale yellow.  Make sure you have little or no nausea before eating solid foods.  Follow the diet recommended by your health care provider. General instructions  Have a responsible adult stay with you until you are awake and alert.  Return to your normal activities as told by your health care provider. Ask your health care provider what activities are safe for you.  Take over-the-counter and prescription medicines only as told by your health care provider.  If you smoke, do not smoke without supervision.  Keep all follow-up visits as told by your health care provider. This is important. Contact a health care provider if:  You continue to have nausea or vomiting at home, and medicines are not helpful.  You cannot drink fluids or start eating again.  You cannot urinate after 8-12 hours.  You develop a skin rash.  You have fever.  You have increasing redness at the site of your procedure. Get help right away if:  You have difficulty breathing.  You have chest pain.  You have unexpected bleeding.  You feel that you are having a life-threatening or urgent problem. This information is not intended to replace advice given to you by your health care provider. Make sure you discuss any questions you have with your health care provider. Document Released: 04/15/2000 Document Revised: 06/12/2015 Document Reviewed: 12/22/2014 Elsevier Interactive Patient Education  2017 [employer].

## 2016-01-29 NOTE — Telephone Encounter (Signed)
Left message advising if pain was unbearable, since office was closed, may need to go to [place]

## 2016-01-30 ENCOUNTER — Telehealth: Payer: Self-pay | Admitting: Obstetrics & Gynecology

## 2016-01-30 NOTE — Telephone Encounter (Signed)
Spoke with pt. Pt is on period. Pt states she passed something yesterday that looked like flesh. She is scheduled for a tubal and ablation Friday. She is still having pain today. Pt called yesterday regarding pain and I left a message after 5 stating if pain was unbearable, she may need to go to ER. Pt didn't get that message. Pt is taking generic pain reliever and using a heating pad. I spoke with JAG and she advised it may have been a decidual cast and I informed pt of this and explained what it was. Advised to continue doing what she's been doing for pain control per JAG and call us back if need be. Go to ER if pain gets out of control. Pt voiced understanding. [place]

## 2016-01-31 ENCOUNTER — Inpatient Hospital Stay (HOSPITAL_COMMUNITY): Admission: RE | Admit: 2016-01-31 | Payer: 59 | Source: Ambulatory Visit

## 2016-01-31 ENCOUNTER — Encounter (HOSPITAL_COMMUNITY): Payer: Self-pay

## 2016-01-31 ENCOUNTER — Encounter (HOSPITAL_COMMUNITY)
Admission: RE | Admit: 2016-01-31 | Discharge: 2016-01-31 | Disposition: A | Discharge status: Home / Self Care | Payer: 59 | Source: Ambulatory Visit | Attending: Obstetrics & Gynecology | Admitting: Obstetrics & Gynecology

## 2016-01-31 DIAGNOSIS — N946 Dysmenorrhea, unspecified: Secondary | ICD-10-CM | POA: Diagnosis not present

## 2016-01-31 DIAGNOSIS — D509 Iron deficiency anemia, unspecified: Secondary | ICD-10-CM | POA: Diagnosis not present

## 2016-01-31 DIAGNOSIS — Z4002 Encounter for prophylactic removal of ovary: Secondary | ICD-10-CM | POA: Diagnosis not present

## 2016-01-31 DIAGNOSIS — N83209 Unspecified ovarian cyst, unspecified side: Secondary | ICD-10-CM | POA: Diagnosis not present

## 2016-01-31 DIAGNOSIS — N3010 Interstitial cystitis (chronic) without hematuria: Secondary | ICD-10-CM | POA: Diagnosis not present

## 2016-01-31 DIAGNOSIS — F419 Anxiety disorder, unspecified: Secondary | ICD-10-CM | POA: Diagnosis not present

## 2016-01-31 DIAGNOSIS — Z302 Encounter for sterilization: Secondary | ICD-10-CM | POA: Diagnosis not present

## 2016-01-31 DIAGNOSIS — N921 Excessive and frequent menstruation with irregular cycle: Secondary | ICD-10-CM | POA: Diagnosis not present

## 2016-01-31 DIAGNOSIS — N840 Polyp of corpus uteri: Secondary | ICD-10-CM | POA: Diagnosis not present

## 2016-01-31 DIAGNOSIS — Z6834 Body mass index (BMI) 34.0-34.9, adult: Secondary | ICD-10-CM | POA: Diagnosis not present

## 2016-01-31 DIAGNOSIS — E039 Hypothyroidism, unspecified: Secondary | ICD-10-CM | POA: Diagnosis not present

## 2016-01-31 DIAGNOSIS — E669 Obesity, unspecified: Secondary | ICD-10-CM | POA: Diagnosis not present

## 2016-01-31 LAB — COMPREHENSIVE METABOLIC PANEL
ALT: 18 U/L (ref 14–54)
AST: 21 U/L (ref 15–41)
Albumin: 3.9 g/dL (ref 3.5–5.0)
Alkaline Phosphatase: 52 U/L (ref 38–126)
Anion gap: 8 (ref 5–15)
BUN: 13 mg/dL (ref 6–20)
CO2: 22 mmol/L (ref 22–32)
Calcium: 9.3 mg/dL (ref 8.9–10.3)
Chloride: 108 mmol/L (ref 101–111)
Creatinine, Ser: 0.74 mg/dL (ref 0.44–1.00)
GFR calc Af Amer: >60 mL/min (ref >60)
GFR calc non Af Amer: >60 mL/min (ref >60)
Glucose, Bld: 114 mg/dL — ABNORMAL HIGH (ref 65–99)
Potassium: 3.6 mmol/L (ref 3.5–5.1)
Sodium: 138 mmol/L (ref 135–145)
Total Bilirubin: 0.5 mg/dL (ref 0.3–1.2)
Total Protein: 6.6 g/dL (ref 6.5–8.1)

## 2016-01-31 LAB — HCG, QUANTITATIVE, PREGNANCY

## 2016-01-31 LAB — CBC
HEMATOCRIT: 35.6 % — AB (ref 36.0–46.0)
HEMOGLOBIN: 11.2 g/dL — AB (ref 12.0–15.0)
MCH: 21.3 pg — ABNORMAL LOW (ref 26.0–34.0)
MCHC: 31.5 g/dL (ref 30.0–36.0)
MCV: 67.8 fL — AB (ref 78.0–100.0)
Platelets: 191 10*3/uL (ref 150–400)
RBC: 5.25 MIL/uL — AB (ref 3.87–5.11)
WBC: 6.4 10*3/uL (ref 4.0–10.5)

## 2016-01-31 NOTE — Telephone Encounter (Signed)
Noted and agree with it all  Keep her surgery scheduled time on Friday

## 2016-01-31 NOTE — Telephone Encounter (Signed)
Left message letting pt know Dr. Elonda Husky reviewed message from yesterday and agrees with it all. Also, advised to keep surgery scheduled for Friday. [place]

## 2016-01-31 NOTE — Pre-Procedure Instructions (Signed)
Case was booked with bilateral laparoscopic salpingectomy and patient states she is to have this done as well, but it was not in the informed consent order. Called Dr Elonda Husky and he confirms that patient is also having the salpingectomy. He asked that modify obtain consent order to include the lap bilateral salpingectomy.

## 2016-02-01 ENCOUNTER — Other Ambulatory Visit (HOSPITAL_COMMUNITY): Payer: 59

## 2016-02-02 ENCOUNTER — Encounter (HOSPITAL_COMMUNITY)
Admission: RE | Disposition: A | Discharge status: Home / Self Care | Payer: Self-pay | Source: Ambulatory Visit | Attending: Obstetrics & Gynecology

## 2016-02-02 ENCOUNTER — Ambulatory Visit (HOSPITAL_COMMUNITY): Payer: 59 | Admitting: Anesthesiology

## 2016-02-02 ENCOUNTER — Ambulatory Visit (HOSPITAL_COMMUNITY)
Admission: RE | Admit: 2016-02-02 | Discharge: 2016-02-02 | Disposition: A | Discharge status: Home / Self Care | Payer: 59 | Source: Ambulatory Visit | Attending: Obstetrics & Gynecology | Admitting: Obstetrics & Gynecology

## 2016-02-02 ENCOUNTER — Encounter (HOSPITAL_COMMUNITY): Payer: Self-pay | Admitting: *Deleted

## 2016-02-02 DIAGNOSIS — N946 Dysmenorrhea, unspecified: Secondary | ICD-10-CM | POA: Insufficient documentation

## 2016-02-02 DIAGNOSIS — D509 Iron deficiency anemia, unspecified: Secondary | ICD-10-CM | POA: Diagnosis not present

## 2016-02-02 DIAGNOSIS — D649 Anemia, unspecified: Secondary | ICD-10-CM | POA: Diagnosis not present

## 2016-02-02 DIAGNOSIS — N921 Excessive and frequent menstruation with irregular cycle: Secondary | ICD-10-CM | POA: Diagnosis not present

## 2016-02-02 DIAGNOSIS — N920 Excessive and frequent menstruation with regular cycle: Secondary | ICD-10-CM | POA: Diagnosis not present

## 2016-02-02 DIAGNOSIS — F419 Anxiety disorder, unspecified: Secondary | ICD-10-CM | POA: Diagnosis not present

## 2016-02-02 DIAGNOSIS — N840 Polyp of corpus uteri: Secondary | ICD-10-CM | POA: Diagnosis not present

## 2016-02-02 DIAGNOSIS — N83209 Unspecified ovarian cyst, unspecified side: Secondary | ICD-10-CM | POA: Insufficient documentation

## 2016-02-02 DIAGNOSIS — Z6834 Body mass index (BMI) 34.0-34.9, adult: Secondary | ICD-10-CM | POA: Insufficient documentation

## 2016-02-02 DIAGNOSIS — N3010 Interstitial cystitis (chronic) without hematuria: Secondary | ICD-10-CM | POA: Insufficient documentation

## 2016-02-02 DIAGNOSIS — Z302 Encounter for sterilization: Secondary | ICD-10-CM | POA: Insufficient documentation

## 2016-02-02 DIAGNOSIS — Z4002 Encounter for prophylactic removal of ovary: Secondary | ICD-10-CM | POA: Diagnosis not present

## 2016-02-02 DIAGNOSIS — E039 Hypothyroidism, unspecified: Secondary | ICD-10-CM | POA: Diagnosis not present

## 2016-02-02 DIAGNOSIS — E669 Obesity, unspecified: Secondary | ICD-10-CM | POA: Insufficient documentation

## 2016-02-02 HISTORY — PX: LAPAROSCOPIC BILATERAL SALPINGECTOMY: SHX5889

## 2016-02-02 HISTORY — PX: DILITATION & CURRETTAGE/HYSTROSCOPY WITH NOVASURE ABLATION: SHX5568

## 2016-02-02 SURGERY — SALPINGECTOMY, BILATERAL, LAPAROSCOPIC
Anesthesia: Monitor Anesthesia Care | Site: Vagina

## 2016-02-02 MED ORDER — PROMETHAZINE HCL 25 MG/ML IJ SOLN
6.2500 mg | Freq: Once | INTRAMUSCULAR | Status: AC
  Administered 2016-02-02: 6.25 mg via INTRAVENOUS

## 2016-02-02 MED ORDER — SODIUM CHLORIDE 0.9 % IR SOLN
Status: DC | PRN
  Administered 2016-02-02: 3000 mL

## 2016-02-02 MED ORDER — NEOSTIGMINE METHYLSULFATE 10 MG/10ML IV SOLN
INTRAVENOUS | Status: DC | PRN
  Administered 2016-02-02: 1 mg via INTRAVENOUS
  Administered 2016-02-02: 4 mg via INTRAVENOUS

## 2016-02-02 MED ORDER — BUPIVACAINE LIPOSOME 1.3 % IJ SUSP
INTRAMUSCULAR | Status: AC
  Filled 2016-02-02: qty 20

## 2016-02-02 MED ORDER — GLYCOPYRROLATE 0.2 MG/ML IJ SOLN
INTRAMUSCULAR | Status: AC
  Filled 2016-02-02: qty 3

## 2016-02-02 MED ORDER — BUPIVACAINE LIPOSOME 1.3 % IJ SUSP
INTRAMUSCULAR | Status: DC | PRN
  Administered 2016-02-02: 20 mL

## 2016-02-02 MED ORDER — KETOROLAC TROMETHAMINE 30 MG/ML IJ SOLN
INTRAMUSCULAR | Status: AC
  Filled 2016-02-02: qty 1

## 2016-02-02 MED ORDER — KETOROLAC TROMETHAMINE 10 MG PO TABS: 10.0000 mg | ORAL_TABLET | Freq: Three times a day (TID) | ORAL | 0 refills | Status: DC | PRN

## 2016-02-02 MED ORDER — HYDROMORPHONE HCL 1 MG/ML IJ SOLN
0.2500 mg | INTRAMUSCULAR | Status: DC | PRN
  Administered 2016-02-02 (×4): 0.25 mg via INTRAVENOUS
  Filled 2016-02-02 (×2): qty 0.5

## 2016-02-02 MED ORDER — LIDOCAINE HCL (CARDIAC) 20 MG/ML IV SOLN
INTRAVENOUS | Status: DC | PRN
  Administered 2016-02-02: 40 mg via INTRAVENOUS

## 2016-02-02 MED ORDER — FENTANYL CITRATE (PF) 250 MCG/5ML IJ SOLN
INTRAMUSCULAR | Status: AC
  Filled 2016-02-02: qty 5

## 2016-02-02 MED ORDER — ONDANSETRON HCL 8 MG PO TABS: 8.0000 mg | ORAL_TABLET | Freq: Three times a day (TID) | ORAL | 0 refills | Status: DC | PRN

## 2016-02-02 MED ORDER — LACTATED RINGERS IV SOLN
INTRAVENOUS | Status: DC
  Administered 2016-02-02 (×2): via INTRAVENOUS

## 2016-02-02 MED ORDER — FENTANYL CITRATE (PF) 100 MCG/2ML IJ SOLN
INTRAMUSCULAR | Status: DC | PRN
  Administered 2016-02-02 (×5): 50 ug via INTRAVENOUS

## 2016-02-02 MED ORDER — SODIUM CHLORIDE 0.9 % IR SOLN
Status: DC | PRN
  Administered 2016-02-02: 1000 mL

## 2016-02-02 MED ORDER — PROMETHAZINE HCL 25 MG/ML IJ SOLN
INTRAMUSCULAR | Status: AC
  Filled 2016-02-02: qty 1

## 2016-02-02 MED ORDER — GLYCOPYRROLATE 0.2 MG/ML IJ SOLN
INTRAMUSCULAR | Status: DC | PRN
  Administered 2016-02-02: 0.6 mg via INTRAVENOUS

## 2016-02-02 MED ORDER — PROPOFOL 10 MG/ML IV BOLUS
INTRAVENOUS | Status: DC | PRN
  Administered 2016-02-02: 150 mg via INTRAVENOUS

## 2016-02-02 MED ORDER — ROCURONIUM BROMIDE 100 MG/10ML IV SOLN
INTRAVENOUS | Status: DC | PRN
  Administered 2016-02-02: 30 mg via INTRAVENOUS

## 2016-02-02 MED ORDER — SEVOFLURANE IN SOLN
RESPIRATORY_TRACT | Status: AC
  Filled 2016-02-02: qty 250

## 2016-02-02 MED ORDER — ONDANSETRON HCL 4 MG/2ML IJ SOLN
4.0000 mg | Freq: Once | INTRAMUSCULAR | Status: AC
  Administered 2016-02-02: 4 mg via INTRAVENOUS
  Filled 2016-02-02: qty 2

## 2016-02-02 MED ORDER — KETOROLAC TROMETHAMINE 30 MG/ML IJ SOLN
30.0000 mg | Freq: Once | INTRAMUSCULAR | Status: AC
  Administered 2016-02-02: 30 mg via INTRAVENOUS

## 2016-02-02 MED ORDER — ONDANSETRON HCL 4 MG/2ML IJ SOLN
INTRAMUSCULAR | Status: AC
  Filled 2016-02-02: qty 2

## 2016-02-02 MED ORDER — HYDROCODONE-ACETAMINOPHEN 5-325 MG PO TABS: 1.0000 | ORAL_TABLET | Freq: Four times a day (QID) | ORAL | 0 refills | Status: DC | PRN

## 2016-02-02 MED ORDER — MIDAZOLAM HCL 2 MG/2ML IJ SOLN
0.5000 mg | INTRAMUSCULAR | Status: DC | PRN
  Administered 2016-02-02 (×2): 2 mg via INTRAVENOUS
  Filled 2016-02-02 (×2): qty 2

## 2016-02-02 MED ORDER — LIDOCAINE HCL (PF) 1 % IJ SOLN
INTRAMUSCULAR | Status: AC
  Filled 2016-02-02: qty 5

## 2016-02-02 MED ORDER — CEFAZOLIN SODIUM-DEXTROSE 2-4 GM/100ML-% IV SOLN
2.0000 g | INTRAVENOUS | Status: AC
  Administered 2016-02-02: 2 g via INTRAVENOUS
  Filled 2016-02-02: qty 100

## 2016-02-02 MED ORDER — ONDANSETRON HCL 4 MG/2ML IJ SOLN
INTRAMUSCULAR | Status: DC | PRN
  Administered 2016-02-02: 4 mg via INTRAVENOUS

## 2016-02-02 SURGICAL SUPPLY — 57 items
ABLATOR ENDOMETRIAL BIPOLAR (ABLATOR) ×3 IMPLANT
APPLIER CLIP 5 13 M/L LIGAMAX5 (MISCELLANEOUS)
APR CLP MED LRG 5 ANG JAW (MISCELLANEOUS)
BAG HAMPER (MISCELLANEOUS) ×3 IMPLANT
BLADE SURG SZ11 CARB STEEL (BLADE) ×3 IMPLANT
CLIP APPLIE 5 13 M/L LIGAMAX5 (MISCELLANEOUS) IMPLANT
CLOTH BEACON ORANGE TIMEOUT ST (SAFETY) ×3 IMPLANT
COVER LIGHT HANDLE STERIS (MISCELLANEOUS) ×6 IMPLANT
DRAPE PROXIMA HALF (DRAPES) ×3 IMPLANT
ELECT REM PT RETURN 9FT ADLT (ELECTROSURGICAL) ×3
ELECTRODE REM PT RTRN 9FT ADLT (ELECTROSURGICAL) ×2 IMPLANT
FILTER SMOKE EVAC LAPAROSHD (FILTER) ×3 IMPLANT
FORMALIN 10 PREFIL 120ML (MISCELLANEOUS) ×6 IMPLANT
GAUZE SPONGE 4X4 16PLY XRAY LF (GAUZE/BANDAGES/DRESSINGS) ×3 IMPLANT
GLOVE BIOGEL PI IND STRL 6.5 (GLOVE) ×2 IMPLANT
GLOVE BIOGEL PI IND STRL 7.0 (GLOVE) ×8 IMPLANT
GLOVE BIOGEL PI IND STRL 8 (GLOVE) ×2 IMPLANT
GLOVE BIOGEL PI INDICATOR 6.5 (GLOVE) ×1
GLOVE BIOGEL PI INDICATOR 7.0 (GLOVE) ×4
GLOVE BIOGEL PI INDICATOR 8 (GLOVE) ×1
GLOVE ECLIPSE 8.0 STRL XLNG CF (GLOVE) ×3 IMPLANT
GLOVE SURG SS PI 6.5 STRL IVOR (GLOVE) ×3 IMPLANT
GOWN STRL REUS W/TWL LRG LVL3 (GOWN DISPOSABLE) ×3 IMPLANT
GOWN STRL REUS W/TWL XL LVL3 (GOWN DISPOSABLE) ×3 IMPLANT
INST SET HYSTEROSCOPY (KITS) ×3 IMPLANT
INST SET LAPROSCOPIC GYN AP (KITS) ×3 IMPLANT
IV NS 1000ML (IV SOLUTION) ×2
IV NS 1000ML BAXH (IV SOLUTION) ×2 IMPLANT
IV NS IRRIG 3000ML ARTHROMATIC (IV SOLUTION) ×3 IMPLANT
KIT ROOM TURNOVER AP CYSTO (KITS) ×3 IMPLANT
MANIFOLD NEPTUNE II (INSTRUMENTS) ×3 IMPLANT
NEEDLE HYPO 21X1.5 SAFETY (NEEDLE) ×3 IMPLANT
NEEDLE INSUFFLATION 14GA 120MM (NEEDLE) ×3 IMPLANT
NS IRRIG 1000ML POUR BTL (IV SOLUTION) ×3 IMPLANT
PACK BASIC III (CUSTOM PROCEDURE TRAY) ×2
PACK PERI GYN (CUSTOM PROCEDURE TRAY) ×3 IMPLANT
PACK SRG BSC III STRL LF ECLPS (CUSTOM PROCEDURE TRAY) ×2 IMPLANT
PAD ARMBOARD 7.5X6 YLW CONV (MISCELLANEOUS) ×3 IMPLANT
PAD TELFA 3X4 1S STER (GAUZE/BANDAGES/DRESSINGS) ×3 IMPLANT
SET BASIN LINEN APH (SET/KITS/TRAYS/PACK) ×3 IMPLANT
SET IRRIG Y TYPE TUR BLADDER L (SET/KITS/TRAYS/PACK) ×3 IMPLANT
SET TUBE IRRIG SUCTION NO TIP (IRRIGATION / IRRIGATOR) IMPLANT
SHEARS HARMONIC ACE PLUS 36CM (ENDOMECHANICALS) ×3 IMPLANT
SHEET LAVH (DRAPES) ×3 IMPLANT
SLEEVE ENDOPATH XCEL 5M (ENDOMECHANICALS) ×3 IMPLANT
SOLUTION ANTI FOG 6CC (MISCELLANEOUS) ×3 IMPLANT
SPONGE GAUZE 2X2 8PLY STRL LF (GAUZE/BANDAGES/DRESSINGS) ×9 IMPLANT
STAPLER VISISTAT 35W (STAPLE) ×3 IMPLANT
SUT VICRYL 0 UR6 27IN ABS (SUTURE) ×3 IMPLANT
SYR 20CC LL (SYRINGE) ×3 IMPLANT
SYRINGE 10CC LL (SYRINGE) ×3 IMPLANT
TAPE CLOTH SURG 4X10 WHT LF (GAUZE/BANDAGES/DRESSINGS) ×3 IMPLANT
TROCAR ENDO BLADELESS 11MM (ENDOMECHANICALS) ×3 IMPLANT
TROCAR XCEL NON-BLD 5MMX100MML (ENDOMECHANICALS) ×3 IMPLANT
TUBING INSUF HEATED (TUBING) ×3 IMPLANT
WARMER LAPAROSCOPE (MISCELLANEOUS) ×3 IMPLANT
YANKAUER SUCT BULB TIP 10FT TU (MISCELLANEOUS) ×3 IMPLANT

## 2016-02-02 NOTE — Anesthesia Procedure Notes (Signed)
Procedure Name: Intubation Date/Time: 02/02/2016 12:37 PM Performed by: Andree Elk, AMY A Pre-anesthesia Checklist: Patient identified, Timeout performed, Emergency Drugs available, Suction available and Patient being monitored Patient Re-evaluated:Patient Re-evaluated prior to inductionOxygen Delivery Method: Circle System Utilized Preoxygenation: Pre-oxygenation with 100% oxygen Intubation Type: IV induction Ventilation: Mask ventilation without difficulty Laryngoscope Size: Miller and 3 Grade View: Grade I Tube type: Oral Tube size: 7.0 mm Number of attempts: 1 Airway Equipment and Method: Stylet Placement Confirmation: ETT inserted through vocal cords under direct vision,  positive ETCO2 and breath sounds checked- equal and bilateral Secured at: 21 cm Tube secured with: Tape Dental Injury: Teeth and Oropharynx as per pre-operative assessment

## 2016-02-02 NOTE — Anesthesia Preprocedure Evaluation (Signed)
Anesthesia Evaluation  Patient identified by MRN, date of birth, ID band Patient awake    Reviewed: Allergy & Precautions, NPO status , Patient's Chart, lab work & pertinent test results  Airway Mallampati: II  TM Distance: >3 FB Neck ROM: Full    Dental  (+) Teeth Intact, Partial Upper   Pulmonary neg pulmonary ROS,    breath sounds clear to auscultation       Cardiovascular negative cardio ROS   Rhythm:Regular Rate:Normal     Neuro/Psych Anxiety    GI/Hepatic negative GI ROS,   Endo/Other  Hypothyroidism   Renal/GU      Musculoskeletal   Abdominal   Peds  Hematology  (+) anemia ,   Anesthesia Other Findings   Reproductive/Obstetrics                             Anesthesia Physical Anesthesia Plan  ASA: II  Anesthesia Plan: MAC   Post-op Pain Management:    Induction: Intravenous  Airway Management Planned: Oral ETT  Additional Equipment:   Intra-op Plan:   Post-operative Plan: Extubation in OR  Informed Consent: I have reviewed the patients History and Physical, chart, labs and discussed the procedure including the risks, benefits and alternatives for the proposed anesthesia with the patient or authorized representative who has indicated his/her understanding and acceptance.     Plan Discussed with:   Anesthesia Plan Comments:         Anesthesia Quick Evaluation

## 2016-02-02 NOTE — Anesthesia Postprocedure Evaluation (Signed)
Anesthesia Post Note  Patient: Hannah Osborn  Procedure(s) Performed: Procedure(s) (LRB): LAPAROSCOPIC BILATERAL SALPINGECTOMY (procedure #1) (Bilateral) DILATATION & CURETTAGE/HYSTEROSCOPY WITH NOVASURE ABLATION (procedure 2) (N/A)  Patient location during evaluation: PACU Anesthesia Type: MAC Level of consciousness: awake and alert and oriented Pain management: pain level controlled Vital Signs Assessment: post-procedure vital signs reviewed and stable Respiratory status: spontaneous breathing and patient connected to face mask oxygen Cardiovascular status: stable Postop Assessment: no signs of nausea or vomiting Anesthetic complications: no     Last Vitals:  Vitals:   02/02/16 1404 02/02/16 1405  BP: (P) 104/66   Pulse: 61 (!) 59  Resp: 18 (!) 23  Temp:      Last Pain:  Vitals:   02/02/16 1351  TempSrc:   PainSc: 5                  Naftali Carchi A

## 2016-02-02 NOTE — Anesthesia Postprocedure Evaluation (Signed)
Anesthesia Post Note  Patient: Hannah Osborn  Procedure(s) Performed: Procedure(s) (LRB): LAPAROSCOPIC BILATERAL SALPINGECTOMY (procedure #1) (Bilateral) DILATATION & CURETTAGE/HYSTEROSCOPY WITH NOVASURE ABLATION (procedure 2) (N/A)  Anesthesia Type: MAC     Last Vitals:  Vitals:   02/02/16 1404 02/02/16 1405  BP: 104/66   Pulse: 61 (!) 59  Resp: 18 (!) 23  Temp:      Last Pain:  Vitals:   02/02/16 1351  TempSrc:   PainSc: 5         Patient complain of nausea; zofran 4 mg IV given          ADAMS, AMY A

## 2016-02-02 NOTE — Transfer of Care (Signed)
Immediate Anesthesia Transfer of Care Note  Patient: Hannah Osborn  Procedure(s) Performed: Procedure(s): LAPAROSCOPIC BILATERAL SALPINGECTOMY (procedure #1) (Bilateral) DILATATION & CURETTAGE/HYSTEROSCOPY WITH NOVASURE ABLATION (procedure 2) (N/A)  Patient Location: PACU  Anesthesia Type:General  Level of Consciousness: awake, alert , oriented and patient cooperative  Airway & Oxygen Therapy: Patient Spontanous Breathing and Patient connected to face mask oxygen  Post-op Assessment: Report given to RN and Post -op Vital signs reviewed and stable  Post vital signs: Reviewed and stable  Last Vitals:  Vitals:   02/02/16 1225 02/02/16 1351  BP: 114/74 (P) 99/76  Pulse:    Resp: 15 (P) 18  Temp:  (P) 36.7 C    Last Pain:  Vitals:   02/02/16 1057  TempSrc: Oral  PainSc: 5       Patients Stated Pain Goal: 6 ([id number])  Complications: No apparent anesthesia complications

## 2016-02-02 NOTE — Discharge Instructions (Signed)
Laparoscopic Tubal Ligation, Care After Refer to this sheet in the next few weeks. These instructions provide you with information about caring for yourself after your procedure. Your health care provider may also give you more specific instructions. Your treatment has been planned according to current medical practices, but problems sometimes occur. Call your health care provider if you have any problems or questions after your procedure. What can I expect after the procedure? After the procedure, it is common to have:  A sore throat.  Discomfort in your shoulder.  Mild discomfort or cramping in your abdomen.  Gas pains.  Pain or soreness in the area where the surgical cut (incision) was made.  A bloated feeling.  Tiredness.  Nausea.  Vomiting. Follow these instructions at home: Medicines  Take over-the-counter and prescription medicines only as told by your health care provider.  Do not take aspirin because it can cause bleeding.  Do not drive or operate heavy machinery while taking prescription pain medicine. Activity  Rest for the rest of the day.  Return to your normal activities as told by your health care provider. Ask your health care provider what activities are safe for you. Incision care  Follow instructions from your health care provider about how to take care of your incision. Make sure you:  Wash your hands with soap and water before you change your bandage (dressing). If soap and water are not available, use hand sanitizer.  Change your dressing as told by your health care provider.  Leave stitches (sutures) in place. They may need to stay in place for 2 weeks or longer.  Check your incision area every day for signs of infection. Check for:  More redness, swelling, or pain.  More fluid or blood.  Warmth.  Pus or a bad smell. Other Instructions  Do not take baths, swim, or use a hot tub until your health care provider approves. You may take  showers.  Keep all follow-up visits as told by your health care provider. This is important.  Have someone help you with your daily household tasks for the first few days. Contact a health care provider if:  You have more redness, swelling, or pain around your incision.  Your incision feels warm to the touch.  You have pus or a bad smell coming from your incision.  The edges of your incision break open after the sutures have been removed.  Your pain does not improve after 2-3 days.  You have a rash.  You repeatedly become dizzy or light-headed.  Your pain medicine is not helping.  You are constipated. Get help right away if:  You have a fever.  You faint.  You have increasing pain in your abdomen.  You have severe pain in one or both of your shoulders.  You have fluid or blood coming from your sutures or from your vagina.  You have shortness of breath or difficulty breathing.  You have chest pain or leg pain.  You have ongoing nausea, vomiting, or diarrhea. This information is not intended to replace advice given to you by your health care provider. Make sure you discuss any questions you have with your health care provider. Document Released: 07/27/2004 Document Revised: 06/12/2015 Document Reviewed: 12/18/2014 Elsevier Interactive Patient Education  2017 [place]. Endometrial Ablation Endometrial ablation removes the lining of the uterus (endometrium). It is usually a same-day, outpatient treatment. Ablation helps avoid major surgery, such as surgery to remove the cervix and uterus (hysterectomy). After endometrial ablation, you  will have little or no menstrual bleeding and may not be able to have children. However, if you are premenopausal, you will need to use a reliable method of birth control following the procedure because of the small chance that pregnancy can occur. There are different reasons to have this procedure. These reasons include:  Heavy  periods.  Bleeding that is causing anemia.  Irregular bleeding.  Bleeding fibroids on the lining inside the uterus if they are smaller than 3 centimeters. This procedure may not be possible for you if:   You want to have children in the future.   You have severe cramps with your menstrual period.   You have precancerous or cancerous cells in your uterus.   You were recently pregnant.   You have gone through menopause.   You have had major surgery on your uterus, resulting in thinning of the uterine wall. Surgeries may include:  The removal of one or more uterine fibroids (myomectomy).  A cesarean section with a classic (vertical) incision on your uterus. Ask your health care provider what type of cesarean you had. Sometimes the scar on your skin is different than the scar on your uterus. Even if you have had surgery on your uterus, certain types of ablation may still be safe for you. Talk with your health care provider. LET [facility] CARE PROVIDER KNOW ABOUT:  Any allergies you have.  All medicines you are taking, including vitamins, herbs, eye drops, creams, and over-the-counter medicines.  Previous problems you or members of your family have had with the use of anesthetics.  Any blood disorders you have.  Previous surgeries you have had.  Medical conditions you have. RISKS AND COMPLICATIONS  Generally, this is a safe procedure. However, as with any procedure, complications can occur. Possible complications include:  Perforation of the uterus.  Bleeding.  Infection of the uterus, bladder, or vagina.  Injury to surrounding organs.  An air bubble to the lung (air embolus).  Pregnancy following the procedure.  Failure of the procedure to help the problem, requiring hysterectomy.  Decreased ability to diagnose cancer in the lining of the uterus. BEFORE THE PROCEDURE  The lining of the uterus must be tested to make sure there is no pre-cancerous or cancer  cells present.  An ultrasound may be performed to look at the size of the uterus and to check for abnormalities.  Medicines may be given to thin the lining of the uterus. PROCEDURE  During the procedure, your health care provider will use a tool called a resectoscope to help see inside your uterus. There are different ways to remove the lining of your uterus.   Radiofrequency - This method uses a radiofrequency-alternating electric current to remove the lining of the uterus.  Cryotherapy - This method uses extreme cold to freeze the lining of the uterus.  Heated-Free Liquid - This method uses heated salt (saline) solution to remove the lining of the uterus.  Microwave - This method uses high-energy microwaves to heat up the lining of the uterus to remove it.  Thermal balloon - This method involves inserting a catheter with a balloon tip into the uterus. The balloon tip is filled with heated fluid to remove the lining of the uterus. AFTER THE PROCEDURE  After your procedure, do not have sexual intercourse or insert anything into your vagina until permitted by your health care provider. After the procedure, you may experience:  Cramps.  Vaginal discharge.  Frequent urination. This information is not intended  to replace advice given to you by your health care provider. Make sure you discuss any questions you have with your health care provider. Document Released: 11/17/2003 Document Revised: 09/28/2014 Document Reviewed: 06/10/2012 Elsevier Interactive Patient Education  2017 [employer]   PATIENT INSTRUCTIONS POST-ANESTHESIA  IMMEDIATELY FOLLOWING SURGERY:  Do not drive or operate machinery for the first twenty four hours after surgery.  Do not make any important decisions for twenty four hours after surgery or while taking narcotic pain medications or sedatives.  If you develop intractable nausea and vomiting or a severe headache please notify your doctor immediately.  FOLLOW-UP:   Please make an appointment with your surgeon as instructed. You do not need to follow up with anesthesia unless specifically instructed to do so.  WOUND CARE INSTRUCTIONS (if applicable):  Keep a dry clean dressing on the anesthesia/puncture wound site if there is drainage.  Once the wound has quit draining you may leave it open to air.  Generally you should leave the bandage intact for twenty four hours unless there is drainage.  If the epidural site drains for more than 36-48 hours please call the anesthesia department.  QUESTIONS?:  Please feel free to call your physician or the hospital operator if you have any questions, and they will be happy to assist you.

## 2016-02-02 NOTE — Addendum Note (Signed)
Addendum  created 02/02/16 1422 by Mickel Baas, CRNA   Anesthesia Event edited, Anesthesia Intra Meds edited, Sign clinical note

## 2016-02-02 NOTE — H&P (Signed)
Preoperative History and Physical  Hannah Osborn is a 31 y.o. G1P1001 with Patient's last menstrual period was 01/25/2016. admitted for a laparoscopic bilateral salpingectomy and hysteroscopy uterine curettage endometrial ablation using NovaSure.  Pt with iron deficiency anemia requiring iron infusion, controlled on megestrol Also desires permanent sterilization via bilateral salpingectomy  PMH:    Past Medical History:  Diagnosis Date  . Allergy   . Anemia   . Anxiety   . Fibroid 01/08/2016  . Hypothyroidism   . Thyroid disease     PSH:     Past Surgical History:  Procedure Laterality Date  . CHOLECYSTECTOMY    . TONSILLECTOMY      POb/GynH:      OB History    Gravida Para Term Preterm AB Living   1 1 1     1    SAB TAB Ectopic Multiple Live Births           1      SH:   Social History  Substance Use Topics  . Smoking status: Never Smoker  . Smokeless tobacco: Never Used  . Alcohol use No    FH:    Family History  Problem Relation Age of Onset  . Pulmonary embolism Mother   . Early death Mother   . Cancer Father     stomach  . Early death Father   . Asthma Son   . Allergies Son   . Heart disease Maternal Uncle   . Cancer Maternal Grandmother     uterine  . Cancer Maternal Grandfather     colon     Allergies: No Known Allergies  Medications:       Current Facility-Administered Medications:  .  ceFAZolin (ANCEF) IVPB 2g/100 mL premix, 2 g, Intravenous, On Call to OR, Florian Buff, MD .  lactated ringers infusion, , Intravenous, Continuous, Lerry Liner, MD, Last Rate: 75 mL/hr at 02/02/16 1117 .  midazolam (VERSED) injection 0.5-2 mg, 0.5-2 mg, Intravenous, Q5 min PRN, Lerry Liner, MD, 2 mg at 02/02/16 1200  Review of Systems:   Review of Systems  Constitutional: Negative for fever, chills, weight loss, malaise/fatigue and diaphoresis.  HENT: Negative for hearing loss, ear pain, nosebleeds, congestion, sore throat, neck pain, tinnitus and  ear discharge.   Eyes: Negative for blurred vision, double vision, photophobia, pain, discharge and redness.  Respiratory: Negative for cough, hemoptysis, sputum production, shortness of breath, wheezing and stridor.   Cardiovascular: Negative for chest pain, palpitations, orthopnea, claudication, leg swelling and PND.  Gastrointestinal: Positive for abdominal pain. Negative for heartburn, nausea, vomiting, diarrhea, constipation, blood in stool and melena.  Genitourinary: Negative for dysuria, urgency, frequency, hematuria and flank pain.  Musculoskeletal: Negative for myalgias, back pain, joint pain and falls.  Skin: Negative for itching and rash.  Neurological: Negative for dizziness, tingling, tremors, sensory change, speech change, focal weakness, seizures, loss of consciousness, weakness and headaches.  Endo/Heme/Allergies: Negative for environmental allergies and polydipsia. Does not bruise/bleed easily.  Psychiatric/Behavioral: Negative for depression, suicidal ideas, hallucinations, memory loss and substance abuse. The patient is not nervous/anxious and does not have insomnia.      PHYSICAL EXAM:  Blood pressure 112/73, pulse 78, temperature 98.5 F (36.9 C), temperature source Oral, resp. rate (!) 0, height 5\' 2"  (1.575 m), weight 193 lb (87.5 kg), last menstrual period 01/25/2016, SpO2 100 %.    Vitals reviewed. Constitutional: She is oriented to person, place, and time. She appears well-developed and well-nourished.  HENT:  Head: Normocephalic  and atraumatic.  Right Ear: External ear normal.  Left Ear: External ear normal.  Nose: Nose normal.  Mouth/Throat: Oropharynx is clear and moist.  Eyes: Conjunctivae and EOM are normal. Pupils are equal, round, and reactive to light. Right eye exhibits no discharge. Left eye exhibits no discharge. No scleral icterus.  Neck: Normal range of motion. Neck supple. No tracheal deviation present. No thyromegaly present.  Cardiovascular:  Normal rate, regular rhythm, normal heart sounds and intact distal pulses.  Exam reveals no gallop and no friction rub.   No murmur heard. Respiratory: Effort normal and breath sounds normal. No respiratory distress. She has no wheezes. She has no rales. She exhibits no tenderness.  GI: Soft. Bowel sounds are normal. She exhibits no distension and no mass. There is tenderness. There is no rebound and no guarding.  Genitourinary:       Vulva is normal without lesions Vagina is pink moist without discharge Cervix normal in appearance and pap is normal Uterus is normal size, contour, position, consistency, mobility, non-tender Adnexa is negative with normal sized ovaries by sonogram  Musculoskeletal: Normal range of motion. She exhibits no edema and no tenderness.  Neurological: She is alert and oriented to person, place, and time. She has normal reflexes. She displays normal reflexes. No cranial nerve deficit. She exhibits normal muscle tone. Coordination normal.  Skin: Skin is warm and dry. No rash noted. No erythema. No pallor.  Psychiatric: She has a normal mood and affect. Her behavior is normal. Judgment and thought content normal.    Labs: Results for orders placed or performed during the hospital encounter of 01/31/16 (from the past 336 hour(s))  CBC   Collection Time: 01/31/16  2:34 PM  Result Value Ref Range   WBC 6.4 4.0 - 10.5 K/uL   RBC 5.25 (H) 3.87 - 5.11 MIL/uL   Hemoglobin 11.2 (L) 12.0 - 15.0 g/dL   HCT 35.6 (L) 36.0 - 46.0 %   MCV 67.8 (L) 78.0 - 100.0 fL   MCH 21.3 (L) 26.0 - 34.0 pg   MCHC 31.5 30.0 - 36.0 g/dL   Platelets 191 150 - 400 K/uL  Comprehensive metabolic panel   Collection Time: 01/31/16  2:34 PM  Result Value Ref Range   Sodium 138 135 - 145 mmol/L   Potassium 3.6 3.5 - 5.1 mmol/L   Chloride 108 101 - 111 mmol/L   CO2 22 22 - 32 mmol/L   Glucose, Bld 114 (H) 65 - 99 mg/dL   BUN 13 6 - 20 mg/dL   Creatinine, Ser 0.74 0.44 - 1.00 mg/dL   Calcium 9.3  8.9 - 10.3 mg/dL   Total Protein 6.6 6.5 - 8.1 g/dL   Albumin 3.9 3.5 - 5.0 g/dL   AST 21 15 - 41 U/L   ALT 18 14 - 54 U/L   Alkaline Phosphatase 52 38 - 126 U/L   Total Bilirubin 0.5 0.3 - 1.2 mg/dL   GFR calc non Af Amer >60 >60 mL/min   GFR calc Af Amer >60 >60 mL/min   Anion gap 8 5 - 15  hCG, quantitative, pregnancy   Collection Time: 01/31/16  2:35 PM  Result Value Ref Range   hCG, Beta Chain, Quant, S <1 <5 mIU/mL    EKG: No orders found for this or any previous visit.  Imaging Studies: US Transvaginal Non-ob  Result Date: 01/05/2016 GYNECOLOGIC SONOGRAM Liliahna R Hovanec is a 31 y.o. G1P1 LMP 12/27/2015 for a pelvic sonogram for menorrhagia and  dysmenorrhea. Uterus                      9.7 x 6.6 x 5.9 cm,anteverted uterus,with a 4.2 x 3.5 x 3.1 cm posterior right subserosal fibroid. Endometrium          21 mm, symmetrical, thickened Right ovary             2.9 x 2.6 x 1.9 cm, wnl Left ovary                4.8 x 4.3 x 2.4 cm, wnl No free fluid seen Technician Comments: PELVIC US TA/TV: anteverted uterus,with a 4.2 x 3.5 x 3.1 cm posterior right subserosal fibroid,thickened endometrium 21 mm,normal ov's bilat,no free fluid,no pain during ultrasound,ov's appear mobile [employer] 01/04/2016 4:54 PM Clinical Impression and recommendations: I have reviewed the sonogram results above. Combined with the patient's current clinical course, below are my impressions and any appropriate recommendations for management based on the sonographic findings: 1. Minimally enlarged uterus, with estimated uterine weight 200 g. 2. Cycle day 8 with slight thickening of endometrium for proliferative phase of cycle. 3. Normal ovaries. FERGUSON,JOHN V   US Pelvis Complete  Result Date: 01/05/2016 GYNECOLOGIC SONOGRAM Malone Scoby Bulson is a 31 y.o. G1P1 LMP 12/27/2015 for a pelvic sonogram for menorrhagia and dysmenorrhea. Uterus                      9.7 x 6.6 x 5.9 cm,anteverted uterus,with a 4.2 x 3.5 x 3.1  cm posterior right subserosal fibroid. Endometrium          21 mm, symmetrical, thickened Right ovary             2.9 x 2.6 x 1.9 cm, wnl Left ovary                4.8 x 4.3 x 2.4 cm, wnl No free fluid seen Technician Comments: PELVIC US TA/TV: anteverted uterus,with a 4.2 x 3.5 x 3.1 cm posterior right subserosal fibroid,thickened endometrium 21 mm,normal ov's bilat,no free fluid,no pain during ultrasound,ov's appear mobile [employer] 01/04/2016 4:54 PM Clinical Impression and recommendations: I have reviewed the sonogram results above. Combined with the patient's current clinical course, below are my impressions and any appropriate recommendations for management based on the sonographic findings: 1. Minimally enlarged uterus, with estimated uterine weight 200 g. 2. Cycle day 8 with slight thickening of endometrium for proliferative phase of cycle. 3. Normal ovaries. FERGUSON,JOHN V      Assessment: Menometrorrhagia Dysmenorrhea Iron deficiency anemia Desires permanent sterilization Patient Active Problem List   Diagnosis Date Noted  . Fibroid 01/08/2016  . Thyroid disorder 12/18/2015  . Interstitial cystitis 12/18/2015  . Anemia 12/18/2015  . Functional ovarian cysts 12/18/2015  . Menorrhagia 12/18/2015  . Class 1 obesity due to excess calories with body mass index (BMI) of 34.0 to 34.9 in adult 12/18/2015  . Environmental allergies 12/18/2015    Plan: Laparoscopic bilateral salpingectomy, hysteroscopy uterine curettage endometrial ablation using novasure  EURE,LUTHER H 02/02/2016 12:17 PM

## 2016-02-02 NOTE — Op Note (Signed)
Preoperative Diagnosis:  1.  Multiparous female desires permanent sterilization                                          2.  Elects to have bilateral salpingectomy for ovarian cancer                                                     prophylaxis  Postoperative Diagnosis:  Same as above  Procedure:  Laparoscopic Bilateral Salpingectomy  Surgeon:  Jacelyn Grip MD  Anaesthesia: general  Findings:  Patient had normal pelvic anatomy and no intraperitoneal abnormalities.  Description of Operation:  Patient was taken to the OR and placed into supine position where she underwent general anaesthesia.  She was placed in the dorsal lithotomy position and prepped and draped in the usual sterile fashion.  An incision was made in the umbilicus and dissection taken down to the rectus fascia which was incised and opened.  The non bladed trocar was then placed and the peritoneal cavity was insufflated.  The above noted findings were observed.  Additional trocars were placed in the right and left lower quadrants under direct visualization without difficulty.  The Harmonic scalpel was employed and salpingectomy of both the right and left tubes was performed.   The tubes were removed from the peritoneal cavity and sent to pathology.  There was good hemostasis bilaterally.  The fascia, peritoneum and subcutaneous tissue were closed using 0 vicryl.  The skin was closed using staples.  Exparel 266 mg 20 cc was injected in the 3 incision trocar sites. The patient was awakened from anaesthesia and taken to the PACU with all counts being correct x 3.  The patient received  1 gram of ancef andToradol 30 mg IV preoperatively.  Florian Buff 02/02/2016 1:29 PM  Preoperative diagnosis: Menometrorrhagia                                        Dysmenorrhea                                        Endometrial polyp   Postoperative diagnoses: Same as above   Procedure: Hysteroscopy, uterine curettage, endometrial ablation  using Novasure  Surgeon: Florian Buff   Anesthesia: Laryngeal mask airway  Findings: The endometrium was significant for small endometrial polyp. There were no fibroid or other abnormalities.  Description of operation: The patient was taken to the operating room and placed in the supine position. She underwent general anesthesia using the laryngeal mask airway. She was placed in the dorsal lithotomy position and prepped and draped in the usual sterile fashion. A Graves speculum was placed and the anterior cervical lip was grasped with a single-tooth tenaculum. The cervix was dilated serially to allow passage of the hysteroscope. Diagnostic hysteroscopy was performed and was found to be normal. A vigorous uterine curettage was then performed and all tissue sent to pathology for evaluation.  I then proceeded to perform the Novasure endometrial ablation.  The cervical length was 3.0. The  uterus sounded to  9.5 cm yielding a net length of 6.5 cm.  The endometrial cavity was 4.5 cm wide. The power was 161 watts.  The total time of therapy was 0 min 45 seconds. The array was evaluated after the procedure and tissue was adherent on all the dimensions of the surface, confirming fundal treatment as well.    All of the equipment worked well throughout the procedure.  The patient was awakened from anesthesia and taken to the recovery room in good stable condition all counts were correct. She received 2 g of Ancef and 30 mg of Toradol preoperatively. She will be discharged from the recovery room and followed up in the office in 1- 2 weeks.  Maisen Klingler H 02/02/2016 1:29 PM

## 2016-02-05 ENCOUNTER — Encounter (HOSPITAL_COMMUNITY): Payer: Self-pay | Admitting: Obstetrics & Gynecology

## 2016-02-08 ENCOUNTER — Ambulatory Visit: Payer: 59 | Admitting: Gastroenterology

## 2016-02-09 ENCOUNTER — Ambulatory Visit (INDEPENDENT_AMBULATORY_CARE_PROVIDER_SITE_OTHER): Payer: 59 | Admitting: Obstetrics & Gynecology

## 2016-02-09 ENCOUNTER — Encounter: Payer: Self-pay | Admitting: Obstetrics & Gynecology

## 2016-02-09 VITALS — BP 118/88 | HR 100 | Wt 191.0 lb

## 2016-02-09 DIAGNOSIS — Z9889 Other specified postprocedural states: Secondary | ICD-10-CM

## 2016-02-09 NOTE — Progress Notes (Signed)
  HPI: Patient returns for routine postoperative follow-up having undergone laparoscopic bilateral salpingectomy, hysteroscopy uterien curettage endo ablation on no problems.  The patient's immediate postoperative recovery has been unremarkable. Since hospital discharge the patient reports .   Current Outpatient Prescriptions: ferrous sulfate 325 (65 FE) MG EC tablet, Take 1 tablet (325 mg total) by mouth 2 (two) times daily., Disp: 100 tablet, Rfl: 3 fluticasone (FLONASE) 50 MCG/ACT nasal spray, Place 1 spray into both nostrils daily., Disp: , Rfl:  HYDROcodone-acetaminophen (NORCO/VICODIN) 5-325 MG tablet, Take 1 tablet by mouth every 6 (six) hours as needed., Disp: 20 tablet, Rfl: 0 ketorolac (TORADOL) 10 MG tablet, Take 1 tablet (10 mg total) by mouth every 8 (eight) hours as needed., Disp: 15 tablet, Rfl: 0 ondansetron (ZOFRAN) 8 MG tablet, Take 1 tablet (8 mg total) by mouth every 8 (eight) hours as needed for nausea., Disp: 12 tablet, Rfl: 0  No current facility-administered medications for this visit.     Blood pressure 118/88, pulse 100, weight 191 lb (86.6 kg), last menstrual period 01/25/2016.  Physical Exam: Incision x 3 clean dry staples removed  Diagnostic Tests:   Pathology: benign  Impression: Normal post op status  Plan:   Follow up: 1  years  Florian Buff, MD

## 2016-02-20 ENCOUNTER — Encounter: Payer: 59 | Admitting: Obstetrics & Gynecology

## 2016-03-01 ENCOUNTER — Encounter: Payer: Self-pay | Admitting: Obstetrics & Gynecology

## 2016-03-03 NOTE — Progress Notes (Signed)
Follow up appointment for results  Chief Complaint  Patient presents with  . Abdominal Pain    taking Megace/still spotting    Blood pressure 124/68, pulse 94, weight 190 lb (86.2 kg), last menstrual period 01/25/2016.    GYNECOLOGIC SONOGRAM   Daniel R Oceguera is a 31 y.o. G1P1 LMP 12/27/2015 for a pelvic sonogram for menorrhagia and dysmenorrhea.  Uterus                      9.7 x 6.6 x 5.9 cm,anteverted uterus,with a 4.2 x 3.5 x 3.1 cm posterior right subserosal fibroid.  Endometrium          21 mm, symmetrical, thickened   Right ovary             2.9 x 2.6 x 1.9 cm, wnl  Left ovary                4.8 x 4.3 x 2.4 cm, wnl  No free fluid seen  Technician Comments:  PELVIC US TA/TV: anteverted uterus,with a 4.2 x 3.5 x 3.1 cm posterior right subserosal fibroid,thickened endometrium 21 mm,normal ov's bilat,no free fluid,no pain during ultrasound,ov's appear mobile    [employer] 01/04/2016 4:54 PM Clinical Impression and recommendations:  I have reviewed the sonogram results above.  Combined with the patient's current clinical course, below are my impressions and any appropriate recommendations for management based on the sonographic findings:  1. Minimally enlarged uterus, with estimated uterine weight 200 g. 2. Cycle day 8 with slight thickening of endometrium for proliferative phase of cycle. 3. Normal ovaries.  [place] ordered this encounter: No orders of the defined types were placed in this encounter.   Orders for this encounter: No orders of the defined types were placed in this encounter.   Impression: Menorrhagia with regular cycle  Dysmenorrhea  Sterilization consult  .  Plan: Pt desires to proceed with tubal ligation and endometrial ablation, no clinically significant fibroids  Follow Up: Return in about 2 weeks (around 02/09/2016) for [place], with Dr Elonda Husky.       Face to face time:  25 minutes  Greater  than 50% of the visit time was spent in counseling and coordination of care with the patient.  The summary and outline of the counseling and care coordination is summarized in the note above.   All questions were answered.  Past Medical History:  Diagnosis Date  . Allergy   . Anemia   . Anxiety   . Fibroid 01/08/2016  . Hypothyroidism   . Thyroid disease     Past Surgical History:  Procedure Laterality Date  . CHOLECYSTECTOMY    . DILITATION & CURRETTAGE/HYSTROSCOPY WITH NOVASURE ABLATION N/A 02/02/2016   Procedure: DILATATION & CURETTAGE/HYSTEROSCOPY WITH NOVASURE ABLATION (procedure 2);  Surgeon: Florian Buff, MD;  Location: [facility];  Service: Gynecology;  Laterality: N/A;  . LAPAROSCOPIC BILATERAL SALPINGECTOMY Bilateral 02/02/2016   Procedure: LAPAROSCOPIC BILATERAL SALPINGECTOMY (procedure #1);  Surgeon: Florian Buff, MD;  Location: [facility];  Service: Gynecology;  Laterality: Bilateral;  . TONSILLECTOMY      OB History    Gravida Para Term Preterm AB Living   1 1 1     1    SAB TAB Ectopic Multiple Live Births           1      No Known Allergies  Social History   Social History  . Marital status: Single  Spouse name: N/A  . Number of children: 1  . Years of education: 60   Occupational History  . 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 Smoking status: Never Smoker  . Smokeless tobacco: Never Used  . Alcohol use No  . Drug use: No  . Sexual activity: Yes    Partners: Male    Birth control/ protection: None   Other Topics Concern  . None   Social History Narrative   Lives with son Naida Sleight   Boyfriend for 57 years   Works in Banker at [employer]    Family History  Problem Relation Age of Onset  . Pulmonary embolism Mother   . Early death Mother   . Cancer Father     stomach  . Early death Father   . Asthma Son   . Allergies Son   . Heart disease Maternal Uncle   . Cancer Maternal Grandmother     uterine   . Cancer Maternal Grandfather     colon

## 2016-03-05 ENCOUNTER — Encounter: Payer: Self-pay | Admitting: Family Medicine

## 2016-03-05 ENCOUNTER — Ambulatory Visit (INDEPENDENT_AMBULATORY_CARE_PROVIDER_SITE_OTHER): Payer: 59 | Admitting: Family Medicine

## 2016-03-05 VITALS — BP 116/64 | HR 88 | Temp 97.7°F | Resp 18 | Ht 63.0 in | Wt 195.1 lb

## 2016-03-05 DIAGNOSIS — H6521 Chronic serous otitis media, right ear: Secondary | ICD-10-CM

## 2016-03-05 MED ORDER — PREDNISONE 20 MG PO TABS: 20.0000 mg | ORAL_TABLET | Freq: Two times a day (BID) | ORAL | 0 refills | Status: DC

## 2016-03-05 MED ORDER — AMOXICILLIN-POT CLAVULANATE 875-125 MG PO TABS: 1.0000 | ORAL_TABLET | Freq: Two times a day (BID) | ORAL | 0 refills | Status: DC

## 2016-03-05 NOTE — Patient Instructions (Signed)
Drink lots of water Continue flonase Consider mucinex Or medicine to loosen mucous Take the antibiotic and the prednisone for 5 days Call for problems

## 2016-03-05 NOTE — Progress Notes (Signed)
Subjective:     Hannah Osborn is a 31 y.o. female who presents for evaluation of symptoms of a URI, with right ear pressure and pain. Symptoms include right ear pressure/pain, low grade fever, nasal congestion, non productive cough and post nasal drip. Onset of symptoms was 5 days ago, and has been gradually worsening since that time. Treatment to date: decongestants and nasal steroids.  Similar ear sensation to when she had ear infection in November.  She got better with Tx, but now the ear is full again.  The following portions of the patient's history were reviewed and updated as appropriate: past family history, past medical history, past social history and problem list.  Review of Systems Pertinent items are noted in HPI.   Objective:    BP 116/64 (BP Location: Right Arm, Patient Position: Sitting, Cuff Size: Normal)   Pulse 88   Temp 97.7 F (36.5 C) (Temporal)   Resp 18   Ht 5\' 3"  (1.6 m)   Wt 195 lb 1.3 oz (88.5 kg)   LMP 02/28/2016 (Exact Date)   SpO2 99%   BMI 34.56 kg/m  General appearance: alert, cooperative and no distress Head: Normocephalic, without obvious abnormality, atraumatic Eyes: negative Ears: normal TM and external ear canal left ear and abnormal TM right ear - dull and mucoid middle ear fluid Nose: clear discharge, mild congestion, turbinates pink, swollen Throat: lips, mucosa, and tongue normal; teeth and gums normal Neck: no adenopathy, supple, symmetrical, trachea midline and thyroid not enlarged, symmetric, no tenderness/mass/nodules Lungs: clear to auscultation bilaterally Heart: regular rate and rhythm, S1, S2 normal, no murmur, click, rub or gallop Extremities: extremities normal, atraumatic, no cyanosis or edema   Assessment:    serous otitis and viral upper respiratory illness   Plan:    Discussed diagnosis and treatment of URI. Suggested symptomatic OTC remedies. Nasal saline spray for congestion. Augmentin per orders. Nasal steroids per  orders. Follow up as needed.

## 2016-03-06 ENCOUNTER — Encounter: Payer: Self-pay | Admitting: Gastroenterology

## 2016-03-06 ENCOUNTER — Ambulatory Visit (INDEPENDENT_AMBULATORY_CARE_PROVIDER_SITE_OTHER): Payer: 59 | Admitting: Gastroenterology

## 2016-03-06 ENCOUNTER — Other Ambulatory Visit: Payer: Self-pay

## 2016-03-06 DIAGNOSIS — K591 Functional diarrhea: Secondary | ICD-10-CM

## 2016-03-06 DIAGNOSIS — K625 Hemorrhage of anus and rectum: Secondary | ICD-10-CM | POA: Diagnosis not present

## 2016-03-06 MED ORDER — DICYCLOMINE HCL 10 MG PO CAPS: ORAL_CAPSULE | ORAL | 11 refills | Status: DC

## 2016-03-06 MED ORDER — PAROXETINE HCL 20 MG PO TABS: ORAL_TABLET | ORAL | 11 refills | Status: DC

## 2016-03-06 NOTE — Assessment & Plan Note (Signed)
SYMPTOMS NOT CONTROLLED AND MOST LIKELY DUE TO BILE SALT DIARRHEA, IBS-D,A ND EXACERBATED BY LACTOSE INTOELRANCE. DIFFERENTIAL DIAGNOSIS INCLUDES LESS LIKELY:  MICROSCOPIC COLITIS, CELIAC SPRUE, THYROID DISTURBANCE, GIARDIASIS, C DIFF COLITIS, OR IBD.   SUBMIT STOOL STUDIES. DRINK WATER TO KEEP YOUR URINE LIGHT YELLOW. TO PREVENT DIARRHEA:  1. FOLLOW A LOW FAT/DAIRY FREE DIET. Marland KitchenMEATS SHOULD BE BAKED, BROILED, GRILLED, OR BOILED. AVOID FRIED FOODS. SEE INFO BELOW.  2. CHEW ONE TUMS WITH MEALS UP TO THREE TIMES A  DAY TO PREVENT DIARRHEA.  3. ADD PAXIL 10 MG DAILY FOR 7 DAYS THE INCREASE TO 2 DAILY.  4. START BENTYL 10 MG 30 MINUTES PRIOR TO BREAKFAST AND IF NEEDED 30 MINS PRIOR TO LUNCH. IT MAY CAUSE DRY MOUTH/EYES, DROWSINESS, DIFFICULTY URINATING, OR BLURRY VISION. HOLD FOR CONSTIPATION  CALL IN SIX WEEKS IF YOUR SYMPTOMS ARE NOT BETTER.  FOLLOW UP IN 3 MOS.

## 2016-03-06 NOTE — Progress Notes (Signed)
Subjective:    Patient ID: Hannah Osborn, female    DOB: [birth year], 31 y.o.   MRN: [id number]  Hannah Everts, MD  HPI During and after she eats she has lower abdominal pain and then has a BM. MAY HAVE URGE TO GO AND ONLY PASS GAS. LATELY HAS DIARRHEA AFTER SHE EATS. GOES AND THEN MAY GO 2-3 MORE TIMES. WAS ANEMIC AND HAD D&C. HAS A HEMORRHOID AND SEES BLOOD IN HER STOOL. WONDERS IF SHE'S LACTOSE INTOLERANT. SYMPTOMS FOR 10 OR MORE YEARS. HEMORRHOID FOR 9 YEARS. SEES BLOOD: 1-2X/MO BUT WAS EVERY TIME SHE HAD A BM. NOW WITH LOOSE STOOLS IT'S NOT AS BAD. STARTED TAKING AUGMENTIN AND PREDNISONE FOR EAR INFECTION. MILD NAUSEA. BMs: WHEN SHE EATS(1-3X/DAY) NO NOCTURNAL STOOLS. NO SORES IN MOUTH, RASH ON LEGS, JOINT PAIN, OR BACK PAIN. ACHY IN HER KNEES AND LEGS 2-3 TIMES A WEEK. MAY LEFT HEAVY THINGS AT WORK. #6-7 FOR PAST COUPLE OF MOS. STRESS: 7/10-RECENT SEPARATION, CARING FOR ADHD SON, TAKES TO DAD, AND THEN TO SCHOOL([employer] MOS), SINGLE MOM, SON HAS NUT ALLERGY. SUPPORT: 5/10. MILK: NOT MUCH, CHEESE: 3-4 TIMES A WEEK. ICE CREAM: 3-4 TIMES A WEEK. ABX: NO. TRAVEL: NO WELL WATER: NO. DAD HELPS WITH SON.  PAIN(CRAMPY, NAUSEA) STARTS UPPER ABDOMEN AND THE MOVES TO LOWER ABDOMEN. MAY HAPPEN 20-30 MINS AFTER OR DURING A MEAL. MUST BE CLOSE TO A BATHROOM AT ALL TIMES WHEN SHE EATS. FEELS LIKE SOMETHING IS BLOCKING BMS FROM COMING OUT. SON: LABOR 8 HRS, VAGINAL DELIVERY, EPISIOTOMY.  AFTER BM ITS CRAMPY STOMACH ACHE. FEELS LIKE HER RECTUM IS INSIDE OUT. WILL NOT SAY UP IN HER RECTUM IF SHE PUSHES IT BACK. WEIGHT LOSS: NO. APPETITE: HUNGRY ALL THE TIME AND NOW LOSES APPETITE OR MAKE HER SELF EAT. LOVES FOOD AND LOVES TO EAT. NO ASPIRIN, BC/GOODY POWDERS, OR NAPROXEN/ALEVE. IBUPROFEN/MOTRIN FOR HEADACHES.    PT DENIES FEVER, CHILLS, HEMATEMESIS, vomiting, melena, CHEST PAIN, SHORTNESS OF BREATH, CHANGE IN BOWEL IN HABITS,  abdominal pain, problems swallowing, problems with sedation, heartburn or  indigestion.  Past Medical History:  Diagnosis Date  . Allergy   . Anemia   . Anxiety   . Fibroid 01/08/2016  . Hypothyroidism   . Thyroid disease    Past Surgical History:  Procedure Laterality Date  . CHOLECYSTECTOMY    . DILITATION & CURRETTAGE/HYSTROSCOPY WITH NOVASURE ABLATION N/A 02/02/2016     . LAPAROSCOPIC BILATERAL SALPINGECTOMY Bilateral 02/02/2016     . TONSILLECTOMY     No Known Allergies  Current Outpatient Prescriptions  Medication Sig Dispense Refill  . amoxicillin-clavulanate (AUGMENTIN) 875-125 MG tablet Take 1 tablet by mouth 2 (two) times daily.    . ferrous sulfate 325 (65 FE) MG EC tablet Take 1 tablet (325 mg total) by mouth 2 (two) times daily.    . fluticasone (FLONASE) 50 MCG/ACT nasal spray Place 1 spray into both nostrils daily.    . predniSONE (DELTASONE) 20 MG tablet Take 1 tablet (20 mg total) by mouth 2 (two) times daily with a meal.     Family History  Problem Relation Age of Onset  . Pulmonary embolism Mother   . Early death Mother   . Cancer Father     stomach  . Early death Father   . Asthma Son   . Allergies Son   . Heart disease Maternal Uncle   . Cancer Maternal Grandmother     uterine  . Cancer Maternal Grandfather     colon  NO  COLON POLYPS  Social History   Social History  . Marital status: Single    Spouse name: N/A  . Number of children: 1  . Years of education: 6   Occupational History  . dietary aid [employer]   Social History Main Topics  . Smoking status: Never Smoker  . Smokeless tobacco: Never Used  . Alcohol use No  . Drug use: No  . Sexual activity: Yes    Partners: Male    Birth control/ protection: None   Other Topics Concern  . Not on file   Social History Narrative   Lives with son Naida Sleight   Boyfriend for 17 years   Works in Banker at [employer]. WAS BLEEDING REALLY BAD AND HAD D&C/BURNED UTERUS BUT STILL  HAVING HEAVY BLEEDING. MAY TAPER OFF OVER NEXT 2-3 MOS. HAD COLONOSCOPY IN [place], [place] BEFORE.    Objective:   Physical Exam  Constitutional: She is oriented to person, place, and time. She appears well-developed and well-nourished. No distress.  HENT:  Head: Normocephalic and atraumatic.  Mouth/Throat: Oropharynx is clear and moist. No oropharyngeal exudate.  Eyes: Pupils are equal, round, and reactive to light. No scleral icterus.  Neck: Normal range of motion. Neck supple.  Cardiovascular: Normal rate, regular rhythm and normal heart sounds.   Pulmonary/Chest: Effort normal and breath sounds normal. No respiratory distress.  Abdominal: Soft. Bowel sounds are normal. She exhibits no distension. There is tenderness. There is no rebound and no guarding.  MILD LLQ   Musculoskeletal: She exhibits no edema.  Lymphadenopathy:    She has no cervical adenopathy.  Neurological: She is alert and oriented to person, place, and time.  NO FOCAL DEFICITS  Psychiatric:  SLIGHTLY ANXIOUS MOOD, NL AFFECT  Vitals reviewed.     Assessment & Plan:

## 2016-03-06 NOTE — Progress Notes (Signed)
cc'ed to pcp °

## 2016-03-06 NOTE — Patient Instructions (Addendum)
SUBMIT STOOL STUDIES AND COMPLETE LABS.   DRINK WATER TO KEEP YOUR URINE LIGHT YELLOW.   TO PREVENT DIARRHEA:  1. FOLLOW A LOW FAT/DAIRY FREE DIET. Marland KitchenMEATS SHOULD BE BAKED, BROILED, GRILLED, OR BOILED. AVOID FRIED FOODS. SEE INFO BELOW.  2. CHEW ONE TUMS WITH MEALS UP TO THREE TIMES A  DAY TO PREVENT DIARRHEA.  3. ADD PAXIL 10 MG DAILY FOR 7 DAYS THE INCREASE TO 2 DAILY.  4. START BENTYL 10 MG 30 MINUTES PRIOR TO BREAKFAST AND IF NEEDED 30 MINS PRIOR TO LUNCH. IT MAY CAUSE DRY MOUTH/EYES, DROWSINESS, DIFFICULTY URINATING, OR BLURRY VISION. HOLD FOR CONSTIPATION  FOR HEMORRHOID PAIN/BLEEDING, SEE DR. Arnoldo Morale.  PLEASE CALL IN SIX WEEKS IF YOUR SYMPTOMS ARE NOT BETTER.  FOLLOW UP IN 3 MOS.   Lactose Free Diet Lactose is a carbohydrate that is found mainly in milk and milk products, as well as in foods with added milk or whey. Lactose must be digested by the enzyme in order to be used by the body. Lactose intolerance occurs when there is a shortage of lactase.When your body is not able to digest lactose, you may feel sick to your stomach (nausea), bloating, cramping, gas and diarrhea.  There are many dairy products that may be tolerated better than milk by some people:  The use of cultured dairy products such as yogurt, buttermilk, cottage cheese, and sweet acidophilus milk (Kefir) for lactase-deficient individuals is usually well tolerated. This is because the healthy bacteria help digest lactose.   Lactose-hydrolyzed milk (Lactaid) contains 40-90% less lactose than milk and may also be well tolerated.   SPECIAL NOTES  Lactose is a carbohydrates. The major food source is dairy products. Reading food labels is important. Many products contain lactose even when they are not made from milk. Look for the following words: whey, milk solids, dry milk solids, nonfat dry milk powder. Typical sources of lactose other than dairy products include breads, candies, cold cuts, prepared and processed  foods, and commercial sauces and gravies.   All foods must be prepared without milk, cream, or other dairy foods.   Soy milk and lactose-free supplements (LACTASE) may be used as an alternative to milk.   FOOD GROUP ALLOWED/RECOMMENDED AVOID/USE SPARINGLY  BREADS / STARCHES 4 servings or more* Breads and rolls made without milk. [place], [place], or [place] bread. Breads and rolls that contain milk. Prepared mixes such as muffins, biscuits, waffles, pancakes. Sweet rolls, donuts, [place] toast (if made with milk or lactose).  Crackers: Soda crackers, graham crackers. Any crackers prepared without lactose. Zwieback crackers, corn curls, or any that contain lactose.  Cereals: Cooked or dry cereals prepared without lactose (read labels). Cooked or dry cereals prepared with lactose (read labels). Total, Cocoa Krispies. Special K.  Potatoes / Pasta / Rice: Any prepared without milk or lactose. Popcorn. Instant potatoes, frozen [place] fries, scalloped or au gratin potatoes.  VEGETABLES 2 servings or more Fresh, frozen, and canned vegetables. Creamed or breaded vegetables. Vegetables in a cheese sauce or with lactose-containing margarines.  FRUIT 2 servings or more All fresh, canned, or frozen fruits that are not processed with lactose. Any canned or frozen fruits processed with lactose.  MEAT & SUBSTITUTES 2 servings or more (4 to 6 oz. total per day) Plain beef, chicken, fish, [place], lamb, veal, pork, or ham. Kosher prepared meat products. Strained or junior meats that do not contain milk. Eggs, soy meat substitutes, nuts. Scrambled eggs, omelets, and souffles that contain milk. Creamed or breaded meat, fish,  or fowl. Sausage products such as wieners, liver sausage, or cold cuts that contain milk solids. Cheese, cottage cheese, or cheese spreads.  MILK None. (See "BEVERAGES" for milk substitutes. See "DESSERTS" for ice cream and frozen desserts.) Milk (whole, 2%, skim, or chocolate). Evaporated,  powdered, or condensed milk; malted milk.  SOUPS & COMBINATION FOODS Bouillon, broth, vegetable soups, clear soups, consomms. Homemade soups made with allowed ingredients. Combination or prepared foods that do not contain milk or milk products (read labels). Cream soups, chowders, commercially prepared soups containing lactose. Macaroni and cheese, pizza. Combination or prepared foods that contain milk or milk products.  DESSERTS & SWEETS In moderation Water and fruit ices; gelatin; angel food cake. Homemade cookies, pies, or cakes made from allowed ingredients. Pudding (if made with water or a milk substitute). Lactose-free tofu desserts. Sugar, honey, corn syrup, jam, jelly; marmalade; molasses (beet sugar); Pure sugar candy; marshmallows. Ice cream, ice milk, sherbet, custard, pudding, frozen yogurt. Commercial cake and cookie mixes. Desserts that contain chocolate. Pie crust made with milk-containing margarine; reduced-calorie desserts made with a sugar substitute that contains lactose. Toffee, peppermint, butterscotch, chocolate, caramels.  FATS & OILS In moderation Butter (as tolerated; contains very small amounts of lactose). Margarines and dressings that do not contain milk, Vegetable oils, shortening, Miracle Whip, mayonnaise, nondairy cream & whipped toppings without lactose or milk solids added (examples: Coffee Rich, Carnation Coffeemate, Rich's Whipped Topping, PolyRich). Berniece Salines. Margarines and salad dressings containing milk; cream, cream cheese; peanut butter with added milk solids, sour cream, chip dips, made with sour cream.  BEVERAGES Carbonated drinks; tea; coffee and freeze-dried coffee; some instant coffees (check labels). Fruit drinks; fruit and vegetable juice; Rice or Soy milk. Ovaltine, hot chocolate. Some cocoas; some instant coffees; instant iced teas; powdered fruit drinks (read labels).   CONDIMENTS / MISCELLANEOUS Soy sauce, carob powder, olives, gravy made with water,  baker's cocoa, pickles, pure seasonings and spices, wine, pure monosodium glutamate, catsup, mustard. Some chewing gums, chocolate, some cocoas. Certain antibiotics and vitamin / mineral preparations. Spice blends if they contain milk products. MSG extender. Artificial sweeteners that contain lactose such as Equal (Nutra-Sweet) and Sweet 'n Low. Some nondairy creamers (read labels).   SAMPLE MENU*  Breakfast   Orange Juice.  Banana.   Bran flakes.   Nondairy Creamer.  [place] Bread (toasted).   Butter or milk-free margarine.   Coffee or tea.    Noon Meal   Chicken Breast.  Rice.   Green beans.   Butter or milk-free margarine.  Fresh melon.   Coffee or tea.    Evening Meal   Roast Beef.  Baked potato.   Butter or milk-free margarine.   Broccoli.   Lettuce salad with vinegar and oil dressing.  [employer]   Coffee or tea.     Low-Fat Diet BREADS, CEREALS, PASTA, RICE, DRIED PEAS, AND BEANS These products are high in carbohydrates and most are low in fat. Therefore, they can be increased in the diet as substitutes for fatty foods. They too, however, contain calories and should not be eaten in excess. Cereals can be eaten for snacks as well as for breakfast.   FRUITS AND VEGETABLES It is good to eat fruits and vegetables. Besides being sources of fiber, both are rich in vitamins and some minerals. They help you get the daily allowances of these nutrients. Fruits and vegetables can be used for snacks and desserts.  MEATS Limit lean meat, chicken, [place], and fish to no more than 6  ounces per day. Beef, Pork, and Lamb Use lean cuts of beef, pork, and lamb. Lean cuts include:  Extra-lean ground beef.  Arm roast.  Sirloin tip.  Center-cut ham.  Round steak.  Loin chops.  Rump roast.  Tenderloin.  Trim all fat off the outside of meats before cooking. It is not necessary to severely decrease the intake of red meat, but lean choices should be made. Lean  meat is rich in protein and contains a highly absorbable form of iron. Premenopausal women, in particular, should avoid reducing lean red meat because this could increase the risk for low red blood cells (iron-deficiency anemia).  Chicken and [place] These are good sources of protein. The fat of poultry can be reduced by removing the skin and underlying fat layers before cooking. Chicken and [place] can be substituted for lean red meat in the diet. Poultry should not be fried or covered with high-fat sauces. Fish and Shellfish Fish is a good source of protein. Shellfish contain cholesterol, but they usually are low in saturated fatty acids. The preparation of fish is important. Like chicken and [place], they should not be fried or covered with high-fat sauces. EGGS Egg whites contain no fat or cholesterol. They can be eaten often. Try 1 to 2 egg whites instead of whole eggs in recipes or use egg substitutes that do not contain yolk. MILK AND DAIRY PRODUCTS Use skim or 1% milk instead of 2% or whole milk. Decrease whole milk, natural, and processed cheeses. Use nonfat or low-fat (2%) cottage cheese or low-fat cheeses made from vegetable oils. Choose nonfat or low-fat (1 to 2%) yogurt. Experiment with evaporated skim milk in recipes that call for heavy cream. Substitute low-fat yogurt or low-fat cottage cheese for sour cream in dips and salad dressings. Have at least 2 servings of low-fat dairy products, such as 2 glasses of skim (or 1%) milk each day to help get your daily calcium intake. FATS AND OILS Reduce the total intake of fats, especially saturated fat. Butterfat, lard, and beef fats are high in saturated fat and cholesterol. These should be avoided as much as possible. Vegetable fats do not contain cholesterol, but certain vegetable fats, such as coconut oil, palm oil, and palm kernel oil are very high in saturated fats. These should be limited. These fats are often used in bakery goods, processed  foods, popcorn, oils, and nondairy creamers. Vegetable shortenings and some peanut butters contain hydrogenated oils, which are also saturated fats. Read the labels on these foods and check for saturated vegetable oils. Unsaturated vegetable oils and fats do not raise blood cholesterol. However, they should be limited because they are fats and are high in calories. Total fat should still be limited to 30% of your daily caloric intake. Desirable liquid vegetable oils are corn oil, cottonseed oil, olive oil, canola oil, safflower oil, soybean oil, and sunflower oil. Peanut oil is not as good, but small amounts are acceptable. Buy a heart-healthy tub margarine that has no partially hydrogenated oils in the ingredients. Mayonnaise and salad dressings often are made from unsaturated fats, but they should also be limited because of their high calorie and fat content. Seeds, nuts, peanut butter, olives, and avocados are high in fat, but the fat is mainly the unsaturated type. These foods should be limited mainly to avoid excess calories and fat. OTHER EATING TIPS Snacks  Most sweets should be limited as snacks. They tend to be rich in calories and fats, and their caloric content outweighs  their nutritional value. Some good choices in snacks are graham crackers, melba toast, soda crackers, bagels (no egg), English muffins, fruits, and vegetables. These snacks are preferable to snack crackers, [place] fries, TORTILLA CHIPS, and POTATO chips. Popcorn should be air-popped or cooked in small amounts of liquid vegetable oil. Desserts Eat fruit, low-fat yogurt, and fruit ices instead of pastries, cake, and cookies. Sherbet, angel food cake, gelatin dessert, frozen low-fat yogurt, or other frozen products that do not contain saturated fat (pure fruit juice bars, frozen ice pops) are also acceptable.  COOKING METHODS Choose those methods that use little or no fat. They include: Poaching.  Braising.  Steaming.  Grilling.   Baking.  Stir-frying.  Broiling.  Microwaving.  Foods can be cooked in a nonstick pan without added fat, or use a nonfat cooking spray in regular cookware. Limit fried foods and avoid frying in saturated fat. Add moisture to lean meats by using water, broth, cooking wines, and other nonfat or low-fat sauces along with the cooking methods mentioned above. Soups and stews should be chilled after cooking. The fat that forms on top after a few hours in the refrigerator should be skimmed off. When preparing meals, avoid using excess salt. Salt can contribute to raising blood pressure in some people.  EATING AWAY FROM HOME Order entres, potatoes, and vegetables without sauces or butter. When meat exceeds the size of a deck of cards (3 to 4 ounces), the rest can be taken home for another meal. Choose vegetable or fruit salads and ask for low-calorie salad dressings to be served on the side. Use dressings sparingly. Limit high-fat toppings, such as bacon, crumbled eggs, cheese, sunflower seeds, and olives. Ask for heart-healthy tub margarine instead of butter.

## 2016-03-06 NOTE — Assessment & Plan Note (Signed)
SYMPTOMS NOT CONTROLLED AND MOST LIKELY DUE TO HEMORRHOID GRADE IV-RECENT TCS.   DRINK WATER TO KEEP YOUR URINE LIGHT YELLOW. FOR HEMORRHOID PAIN/BLEEDING, SEE DR. Arnoldo Morale.   FOLLOW UP IN 3 MOS.

## 2016-03-06 NOTE — Progress Notes (Signed)
ON RECALL  °

## 2016-03-07 ENCOUNTER — Telehealth: Payer: Self-pay

## 2016-03-07 LAB — TISSUE TRANSGLUTAMINASE, IGA: TISSUE TRANSGLUTAMINASE AB, IGA: 1 U/mL (ref <4)

## 2016-03-07 NOTE — Telephone Encounter (Signed)
Letter mailed to inform pt of appt with Dr. Arnoldo Morale.

## 2016-03-07 NOTE — Telephone Encounter (Signed)
Received fax from Dr. Arnoldo Morale' office (surgeon). Pt has appt 03/28/16 at 9:00am. LMOVM and informed pt.

## 2016-03-08 DIAGNOSIS — K591 Functional diarrhea: Secondary | ICD-10-CM | POA: Diagnosis not present

## 2016-03-09 LAB — CLOSTRIDIUM DIFFICILE BY PCR: Toxigenic C. Difficile by PCR: NOT DETECTED

## 2016-03-11 LAB — FECAL LACTOFERRIN, QUANT: Lactoferrin: NEGATIVE

## 2016-03-13 LAB — GIARDIA ANTIGEN

## 2016-03-14 ENCOUNTER — Ambulatory Visit (INDEPENDENT_AMBULATORY_CARE_PROVIDER_SITE_OTHER): Payer: 59 | Admitting: Otolaryngology

## 2016-03-14 DIAGNOSIS — J342 Deviated nasal septum: Secondary | ICD-10-CM | POA: Diagnosis not present

## 2016-03-14 DIAGNOSIS — H6981 Other specified disorders of Eustachian tube, right ear: Secondary | ICD-10-CM | POA: Diagnosis not present

## 2016-03-14 DIAGNOSIS — J343 Hypertrophy of nasal turbinates: Secondary | ICD-10-CM

## 2016-03-14 DIAGNOSIS — J310 Chronic rhinitis: Secondary | ICD-10-CM | POA: Diagnosis not present

## 2016-03-25 ENCOUNTER — Encounter: Payer: Self-pay | Admitting: Gastroenterology

## 2016-03-27 ENCOUNTER — Encounter (HOSPITAL_COMMUNITY): Payer: 59 | Attending: Oncology

## 2016-03-27 DIAGNOSIS — Z9889 Other specified postprocedural states: Secondary | ICD-10-CM | POA: Insufficient documentation

## 2016-03-27 DIAGNOSIS — D539 Nutritional anemia, unspecified: Secondary | ICD-10-CM | POA: Insufficient documentation

## 2016-03-27 DIAGNOSIS — Z9049 Acquired absence of other specified parts of digestive tract: Secondary | ICD-10-CM | POA: Diagnosis not present

## 2016-03-27 DIAGNOSIS — Z79899 Other long term (current) drug therapy: Secondary | ICD-10-CM | POA: Insufficient documentation

## 2016-03-27 DIAGNOSIS — Z808 Family history of malignant neoplasm of other organs or systems: Secondary | ICD-10-CM | POA: Diagnosis not present

## 2016-03-27 DIAGNOSIS — Z8249 Family history of ischemic heart disease and other diseases of the circulatory system: Secondary | ICD-10-CM | POA: Diagnosis not present

## 2016-03-27 DIAGNOSIS — Z5189 Encounter for other specified aftercare: Secondary | ICD-10-CM | POA: Diagnosis not present

## 2016-03-27 DIAGNOSIS — F419 Anxiety disorder, unspecified: Secondary | ICD-10-CM | POA: Insufficient documentation

## 2016-03-27 DIAGNOSIS — D500 Iron deficiency anemia secondary to blood loss (chronic): Secondary | ICD-10-CM | POA: Diagnosis not present

## 2016-03-27 DIAGNOSIS — N920 Excessive and frequent menstruation with regular cycle: Secondary | ICD-10-CM | POA: Diagnosis not present

## 2016-03-27 DIAGNOSIS — D508 Other iron deficiency anemias: Secondary | ICD-10-CM

## 2016-03-27 LAB — CBC WITH DIFFERENTIAL/PLATELET
BASOS ABS: 0 10*3/uL (ref 0.0–0.1)
Basophils Relative: 0 %
EOS PCT: 0 %
Eosinophils Absolute: 0 10*3/uL (ref 0.0–0.7)
HEMATOCRIT: 36.1 % (ref 36.0–46.0)
Hemoglobin: 11.9 g/dL — ABNORMAL LOW (ref 12.0–15.0)
LYMPHS ABS: 1.6 10*3/uL (ref 0.7–4.0)
Lymphocytes Relative: 25 %
MCH: 25.3 pg — AB (ref 26.0–34.0)
MCHC: 33 g/dL (ref 30.0–36.0)
MCV: 76.8 fL — ABNORMAL LOW (ref 78.0–100.0)
Monocytes Absolute: 0.3 10*3/uL (ref 0.1–1.0)
Monocytes Relative: 5 %
NEUTROS ABS: 4.6 10*3/uL (ref 1.7–7.7)
Neutrophils Relative %: 70 %
Platelets: 226 10*3/uL (ref 150–400)
RBC: 4.7 MIL/uL (ref 3.87–5.11)
WBC: 6.5 10*3/uL (ref 4.0–10.5)

## 2016-03-28 ENCOUNTER — Encounter (HOSPITAL_COMMUNITY): Payer: Self-pay

## 2016-03-28 ENCOUNTER — Encounter (HOSPITAL_BASED_OUTPATIENT_CLINIC_OR_DEPARTMENT_OTHER): Payer: 59 | Admitting: Oncology

## 2016-03-28 ENCOUNTER — Ambulatory Visit (INDEPENDENT_AMBULATORY_CARE_PROVIDER_SITE_OTHER): Payer: 59 | Admitting: General Surgery

## 2016-03-28 ENCOUNTER — Encounter: Payer: Self-pay | Admitting: General Surgery

## 2016-03-28 VITALS — BP 128/72 | HR 81 | Temp 98.0°F | Resp 20 | Ht 63.0 in | Wt 194.0 lb

## 2016-03-28 VITALS — BP 120/77 | HR 67 | Temp 98.7°F | Resp 16 | Wt 193.5 lb

## 2016-03-28 DIAGNOSIS — K589 Irritable bowel syndrome without diarrhea: Secondary | ICD-10-CM

## 2016-03-28 DIAGNOSIS — D500 Iron deficiency anemia secondary to blood loss (chronic): Secondary | ICD-10-CM

## 2016-03-28 DIAGNOSIS — K648 Other hemorrhoids: Secondary | ICD-10-CM | POA: Diagnosis not present

## 2016-03-28 DIAGNOSIS — R5383 Other fatigue: Secondary | ICD-10-CM | POA: Diagnosis not present

## 2016-03-28 DIAGNOSIS — R109 Unspecified abdominal pain: Secondary | ICD-10-CM

## 2016-03-28 DIAGNOSIS — K644 Residual hemorrhoidal skin tags: Secondary | ICD-10-CM | POA: Diagnosis not present

## 2016-03-28 DIAGNOSIS — R58 Hemorrhage, not elsewhere classified: Secondary | ICD-10-CM

## 2016-03-28 DIAGNOSIS — K649 Unspecified hemorrhoids: Secondary | ICD-10-CM

## 2016-03-28 DIAGNOSIS — D509 Iron deficiency anemia, unspecified: Secondary | ICD-10-CM | POA: Diagnosis not present

## 2016-03-28 NOTE — Patient Instructions (Signed)
Hemorrhoids Hemorrhoids are swollen veins in and around the rectum or anus. There are two types of hemorrhoids:  Internal hemorrhoids. These occur in the veins that are just inside the rectum. They may poke through to the outside and become irritated and painful.  External hemorrhoids. These occur in the veins that are outside of the anus and can be felt as a painful swelling or hard lump near the anus.  Most hemorrhoids do not cause serious problems, and they can be managed with home treatments such as diet and lifestyle changes. If home treatments do not help your symptoms, procedures can be done to shrink or remove the hemorrhoids. What are the causes? This condition is caused by increased pressure in the anal area. This pressure may result from various things, including:  Constipation.  Straining to have a bowel movement.  Diarrhea.  Pregnancy.  Obesity.  Sitting for long periods of time.  Heavy lifting or other activity that causes you to strain.  Anal sex.  What are the signs or symptoms? Symptoms of this condition include:  Pain.  Anal itching or irritation.  Rectal bleeding.  Leakage of stool (feces).  Anal swelling.  One or more lumps around the anus.  How is this diagnosed? This condition can often be diagnosed through a visual exam. Other exams or tests may also be done, such as:  Examination of the rectal area with a gloved hand (digital rectal exam).  Examination of the anal canal using a small tube (anoscope).  A blood test, if you have lost a significant amount of blood.  A test to look inside the colon (sigmoidoscopy or colonoscopy).  How is this treated? This condition can usually be treated at home. However, various procedures may be done if dietary changes, lifestyle changes, and other home treatments do not help your symptoms. These procedures can help make the hemorrhoids smaller or remove them completely. Some of these procedures involve  surgery, and others do not. Common procedures include:  Rubber band ligation. Rubber bands are placed at the base of the hemorrhoids to cut off the blood supply to them.  Sclerotherapy. Medicine is injected into the hemorrhoids to shrink them.  Infrared coagulation. A type of light energy is used to get rid of the hemorrhoids.  Hemorrhoidectomy surgery. The hemorrhoids are surgically removed, and the veins that supply them are tied off.  Stapled hemorrhoidopexy surgery. A circular stapling device is used to remove the hemorrhoids and use staples to cut off the blood supply to them.  Follow these instructions at home: Eating and drinking  Eat foods that have a lot of fiber in them, such as whole grains, beans, nuts, fruits, and vegetables. Ask your health care provider about taking products that have added fiber (fiber supplements).  Drink enough fluid to keep your urine clear or pale yellow. Managing pain and swelling  Take warm sitz baths for 20 minutes, 3-4 times a day to ease pain and discomfort.  If directed, apply ice to the affected area. Using ice packs between sitz baths may be helpful. ? Put ice in a plastic bag. ? Place a towel between your skin and the bag. ? Leave the ice on for 20 minutes, 2-3 times a day. General instructions  Take over-the-counter and prescription medicines only as told by your health care provider.  Use medicated creams or suppositories as told.  Exercise regularly.  Go to the bathroom when you have the urge to have a bowel movement. Do not wait.    Avoid straining to have bowel movements.  Keep the anal area dry and clean. Use wet toilet paper or moist towelettes after a bowel movement.  Do not sit on the toilet for long periods of time. This increases blood pooling and pain. Contact a health care provider if:  You have increasing pain and swelling that are not controlled by treatment or medicine.  You have uncontrolled bleeding.  You  have difficulty having a bowel movement, or you are unable to have a bowel movement.  You have pain or inflammation outside the area of the hemorrhoids. This information is not intended to replace advice given to you by your health care provider. Make sure you discuss any questions you have with your health care provider. Document Released: 01/05/2000 Document Revised: 06/07/2015 Document Reviewed: 09/21/2014 Elsevier Interactive Patient Education  2017 [employer] Inc.  

## 2016-03-28 NOTE — Patient Instructions (Signed)
[facility] at [facility] Discharge Instructions  RECOMMENDATIONS MADE BY THE CONSULTANT AND ANY TEST RESULTS WILL BE SENT TO YOUR REFERRING PHYSICIAN.  You were seen today by Dr. Louise Zhou Follow up in 3 months with labs See Amy up front for appointments   Thank you for choosing [facility] at [facility] to provide your oncology and hematology care.  To afford each patient quality time with our provider, please arrive at least 15 minutes before your scheduled appointment time.    If you have a lab appointment with the Cancer Center please come in thru the  Main Entrance and check in at the main information desk  You need to re-schedule your appointment should you arrive 10 or more minutes late.  We strive to give you quality time with our providers, and arriving late affects you and other patients whose appointments are after yours.  Also, if you no show three or more times for appointments you may be dismissed from the clinic at the providers discretion.     Again, thank you for choosing [facility].  Our hope is that these requests will decrease the amount of time that you wait before being seen by our physicians.       _____________________________________________________________  Should you have questions after your visit to [facility], please contact our office at [phone] between the hours of 8:30 a.m. and 4:30 p.m.  Voicemails left after 4:30 p.m. will not be returned until the following business day.  For prescription refill requests, have your pharmacy contact our office.       Resources For Cancer Patients and their Caregivers ? American Cancer Society: Can assist with transportation, wigs, general needs, runs Look Good Feel Better.        1-888-227-6333 ? Cancer Care: Provides financial assistance, online support groups, medication/co-pay assistance.  1-800-813-HOPE (4673) ? [facility] Assists [place] cancer patients and their families through emotional , educational and financial support.  336-427-4357 ? [place] DSS Where to apply for food stamps, Medicaid and utility assistance. 336-342-1394 ? [facility]: Transportation to medical appointments. 336-347-2287 ? Social Security Administration: May apply for disability if have a Stage IV cancer. 336-342-7796 1-800-772-1213 ? [place] Aging, Disability and Transit Services: Assists with nutrition, care and transit needs. 336-349-2343  Cancer Center Support Programs: @[email or web address]@ > Cancer Support Group  2nd Tuesday of the month 1pm-2pm, Journey Room  > Creative Journey  3rd Tuesday of the month 1130am-1pm, Journey Room  > Look Good Feel Better  1st Wednesday of the month 10am-12 noon, Journey Room (Call American Cancer Society to register 1-800-395-5775)    

## 2016-03-28 NOTE — Progress Notes (Signed)
Hannah Osborn; [id number]; [birth year]   HPI Patient is a 31 year old black female who was having intermittent episodes of hemorrhoidal bleeding over the last 9 years.  She has never had hemorrhoidal surgery.  She states she has tried various creams and suppositories  In the past. She has had intestinal issues currently is having multiple episodes of loose stools.  She denies constipation or incontinence.  She has no pain at the present time.  She was referred by Dr. Oneida Alar For evaluation and treatment of bleeding hemorrhoidal disease.  Patient has noted  Toilet paper when she wipes or so.  She has not passed clots.  He has been intermittent for many years.  Recently, it seems to be worsening. Past Medical History:  Diagnosis Date  . Allergy   . Anemia   . Anxiety   . Fibroid 01/08/2016  . Hypothyroidism   . Thyroid disease     Past Surgical History:  Procedure Laterality Date  . CHOLECYSTECTOMY    . DILITATION & CURRETTAGE/HYSTROSCOPY WITH NOVASURE ABLATION N/A 02/02/2016   Procedure: DILATATION & CURETTAGE/HYSTEROSCOPY WITH NOVASURE ABLATION (procedure 2);  Surgeon: Florian Buff, MD;  Location: [facility];  Service: Gynecology;  Laterality: N/A;  . LAPAROSCOPIC BILATERAL SALPINGECTOMY Bilateral 02/02/2016   Procedure: LAPAROSCOPIC BILATERAL SALPINGECTOMY (procedure #1);  Surgeon: Florian Buff, MD;  Location: [facility];  Service: Gynecology;  Laterality: Bilateral;  . TONSILLECTOMY      Family History  Problem Relation Age of Onset  . Pulmonary embolism Mother   . Early death Mother   . Cancer Father     stomach  . Early death Father   . Asthma Son   . Allergies Son   . Heart disease Maternal Uncle   . Cancer Maternal Grandmother     uterine  . Cancer Maternal Grandfather     colon    Current Outpatient Prescriptions on File Prior to Visit  Medication Sig Dispense Refill  . dicyclomine (BENTYL) 10 MG capsule 1 PO 30 MINUTES PRIOR TO BREAKFAST AND IF NEEDED 30 MINS PRIOR TO  LUNCH  60 capsule 11  . ferrous sulfate 325 (65 FE) MG EC tablet Take 1 tablet (325 mg total) by mouth 2 (two) times daily. 100 tablet 3  . fluticasone (FLONASE) 50 MCG/ACT nasal spray Place 1 spray into both nostrils daily.    Marland Kitchen PARoxetine (PAXIL) 20 MG tablet 1/2 PO DAILY FOR 7 DAYS THEN ONE DAILY 30 tablet 11   No current facility-administered medications on file prior to visit.     No Known Allergies  History  Alcohol Use No    History  Smoking Status  . Never Smoker  Smokeless Tobacco  . Never Used    Review of Systems  Constitutional: Negative.   HENT: Positive for ear pain.   Eyes: Negative.   Respiratory: Negative.   Cardiovascular: Negative.   Gastrointestinal: Positive for abdominal pain and blood in stool.  Genitourinary: Positive for frequency.  Musculoskeletal: Negative.   Skin: Negative.   Neurological: Negative.   Endo/Heme/Allergies: Negative.   Psychiatric/Behavioral: Negative.     Objective   Vitals:   03/28/16 1152  BP: 128/72  Pulse: 81  Resp: 20  Temp: 98 F (36.7 C)    Physical Exam  Constitutional: She is oriented to person, place, and time and well-developed, well-nourished, and in no distress.  HENT:  Head: Normocephalic and atraumatic.  Neck: Normal range of motion. Neck supple.  Cardiovascular: Normal rate and normal heart sounds.  Pulmonary/Chest: Effort normal and breath sounds normal. She has no wheezes. She has no rales.  Abdominal: Soft. She exhibits no distension. There is no tenderness.  Rectal examination reveals internal and external hemorrhoidal tissue with evidence of recent bleeding, grade 3/4 along the right side of the anus.  Lesser hemorrhoidal tissue noted along the left side of the anus.  Neurological: She is alert and oriented to person, place, and time.  Skin: Skin is warm and dry.  Vitals reviewed.   Assessment  Bleeding internal and external hemorrhoidal disease Plan    patient will call to schedule extensive  hemorrhoidectomy.  Risks and benefits of the procedure including bleeding, infection, possibly recurrence of hemorrhoidal disease were fully explained to the patient, who gave informed consent.  I did explain to the patient that I could not fully take care of all her hemorrhoidal issues.  I would take care of those that are actively causing her difficulty at this time.

## 2016-03-28 NOTE — Progress Notes (Signed)
[place] @ [facility] Telephone:(336) [phone]  Fax:(336) [phone]   PROGRESS NOTE  Hannah Osborn OB: 08/11/1985  MR#: [id number]  QIO#:[id number]  Patient Care Team: Raylene Everts, MD as PCP - General (Family Medicine) Danie Binder, MD as Consulting Physician (Gastroenterology)  CHIEF COMPLAINT:  Chief Complaint  Patient presents with  . Follow-up    VISIT DIAGNOSIS:     ICD-9-CM ICD-10-CM   1. Iron deficiency anemia due to chronic blood loss 280.0 D50.0       No history exists.    Oncology Flowsheet 01/05/2016 01/11/2016 02/02/2016 02/02/2016  ferumoxytol (FERAHEME) IV 510 mg 510 mg - -  ondansetron (ZOFRAN) IV - - 4 mg 4 mg  promethazine (PHENERGAN) IV - - 6.25 mg      INTERVAL HISTORY:   31 year old lady who presented with hemoglobin of 7.4. Has macrocytic anemia Patient has a heavy menstrual cycle for last several years  Oncology Flowsheet 01/05/2016 01/11/2016  ferumoxytol [facility]) IV 510 mg 510 mg    Hannah Osborn presents today unaccompanied for continued follow up.   She state she somewhat felt better after receiving IV iron in December 2017. She notes she did not have any side effects with it.   She had an uterine ablation in January 2018 for her menorrhalgia, however since then she still had her menstrual cycles however they're a little lighter. She notes she still currently has ongoing bleeding everyday since Feb 7. She notes her periods are less heavy then they were previously but still bleeding a lot.   She is taking ferrous sulfate 325mg  PO BID. She reports having constipation but takes a stool softener for it. No pica symptoms.  Notes she has bleeding in her stool, due to hemorrhoids. She went to see her GI and has to have surgery for hemorrhoids. She is not currently sure when the surgery is, but planning for April .  She reports having some abdominal pain due to her IBS. She also notes she alternates between having diarrhea and constipation.    She states she is still fatigued. Denies chest pain, sob.    Review of Systems  Constitutional: Positive for malaise/fatigue.       Denies ice cravings  HENT: Negative.   Eyes: Negative.   Respiratory: Negative.  Negative for shortness of breath.   Cardiovascular: Negative for chest pain.  Gastrointestinal: Positive for abdominal pain (attributed to her IBS), blood in stool (going to get surgery for hemorrhoids), constipation (takes a stool softner for it.) and diarrhea.  Genitourinary: Negative.   Musculoskeletal: Negative.   Skin: Negative.   Neurological: Positive for weakness.  Endo/Heme/Allergies: Negative.   Psychiatric/Behavioral: Negative.   All other systems reviewed and are negative.   PAST MEDICAL HISTORY: Past Medical History:  Diagnosis Date  . Allergy   . Anemia   . Anxiety   . Fibroid 01/08/2016  . Hypothyroidism   . Thyroid disease     PAST SURGICAL HISTORY: Past Surgical History:  Procedure Laterality Date  . CHOLECYSTECTOMY    . DILITATION & CURRETTAGE/HYSTROSCOPY WITH NOVASURE ABLATION N/A 02/02/2016   Procedure: DILATATION & CURETTAGE/HYSTEROSCOPY WITH NOVASURE ABLATION (procedure 2);  Surgeon: Florian Buff, MD;  Location: [facility];  Service: Gynecology;  Laterality: N/A;  . LAPAROSCOPIC BILATERAL SALPINGECTOMY Bilateral 02/02/2016   Procedure: LAPAROSCOPIC BILATERAL SALPINGECTOMY (procedure #1);  Surgeon: Florian Buff, MD;  Location: [facility];  Service: Gynecology;  Laterality: Bilateral;  . TONSILLECTOMY      FAMILY HISTORY Family History  Problem Relation Age of Onset  . Pulmonary embolism Mother   . Early death Mother   . Cancer Father     stomach  . Early death Father   . Asthma Son   . Allergies Son   . Heart disease Maternal Uncle   . Cancer Maternal Grandmother     uterine  . Cancer Maternal Grandfather     colon    GYNECOLOGIC HISTORY:  Patient's last menstrual period was 02/28/2016 (exact date).     ADVANCED DIRECTIVES:   Patient does not have any living will or healthcare power of attorney.  Information was given .  Available resources had been discussed.  We will follow-up on subsequent appointments regarding this issue  HEALTH MAINTENANCE: Social History  Substance Use Topics  . Smoking status: Never Smoker  . Smokeless tobacco: Never Used  . Alcohol use No     Colonoscopy:  PAP:  Bone density:  Lipid panel:  No Known Allergies  Current Outpatient Prescriptions  Medication Sig Dispense Refill  . dicyclomine (BENTYL) 10 MG capsule 1 PO 30 MINUTES PRIOR TO BREAKFAST AND IF NEEDED 30 MINS PRIOR TO  LUNCH 60 capsule 11  . ferrous sulfate 325 (65 FE) MG EC tablet Take 1 tablet (325 mg total) by mouth 2 (two) times daily. 100 tablet 3  . fluticasone (FLONASE) 50 MCG/ACT nasal spray Place 1 spray into both nostrils daily.    Marland Kitchen PARoxetine (PAXIL) 20 MG tablet 1/2 PO DAILY FOR 7 DAYS THEN ONE DAILY 30 tablet 11   No current facility-administered medications for this visit.       ECOG FS:1 - Symptomatic but completely ambulatory  Vitals:   03/28/16 1430  BP: 120/77  Pulse: 67  Resp: 16  Temp: 98.7 F (37.1 C)   Filed Weights   03/28/16 1430  Weight: 193 lb 8 oz (87.8 kg)      Physical Exam  Constitutional: She is oriented to person, place, and time and well-developed, well-nourished, and in no distress.  HENT:  Head: Normocephalic and atraumatic.  Eyes: Conjunctivae and EOM are normal. Pupils are equal, round, and reactive to light.  Neck: Normal range of motion. Neck supple.  Cardiovascular: Normal rate, regular rhythm and normal heart sounds.   Pulmonary/Chest: Effort normal and breath sounds normal.  Abdominal: Soft. Bowel sounds are normal.  Musculoskeletal: Normal range of motion.  Neurological: She is alert and oriented to person, place, and time. Gait normal.  Skin: Skin is warm and dry.  Nursing note and vitals reviewed.     LAB RESULTS:  Appointment on 03/27/2016   Component Date Value Ref Range Status  . WBC 03/27/2016 6.5  4.0 - 10.5 K/uL Final  . RBC 03/27/2016 4.70  3.87 - 5.11 MIL/uL Final  . Hemoglobin 03/27/2016 11.9* 12.0 - 15.0 g/dL Final  . HCT 03/27/2016 36.1  36.0 - 46.0 % Final  . MCV 03/27/2016 76.8* 78.0 - 100.0 fL Final  . MCH 03/27/2016 25.3* 26.0 - 34.0 pg Final  . MCHC 03/27/2016 33.0  30.0 - 36.0 g/dL Final  . Platelets 03/27/2016 226  150 - 400 K/uL Final  . Neutrophils Relative % 03/27/2016 70  % Final  . Lymphocytes Relative 03/27/2016 25  % Final  . Monocytes Relative 03/27/2016 5  % Final  . Eosinophils Relative 03/27/2016 0  % Final  . Basophils Relative 03/27/2016 0  % Final  . Neutro Abs 03/27/2016 4.6  1.7 - 7.7 K/uL Final  . Lymphs Abs  03/27/2016 1.6  0.7 - 4.0 K/uL Final  . Monocytes Absolute 03/27/2016 0.3  0.1 - 1.0 K/uL Final  . Eosinophils Absolute 03/27/2016 0.0  0.0 - 0.7 K/uL Final  . Basophils Absolute 03/27/2016 0.0  0.0 - 0.1 K/uL Final  . RBC Morphology 03/27/2016 ANISOCYTES   Final  . Smear Review 03/27/2016 LARGE PLATELETS PRESENT   Final    PATHOLOGY:    RADIOLOGY: I have reviewed the images below and agree with the reported results  US Pelvis Complete 01/05/2016  IMPRESSION: 1. Minimally enlarged uterus, with estimated uterine weight 200 g. 2. Cycle day 8 with slight thickening of endometrium for proliferative phase of cycle. 3. Normal ovaries.   ASSESSMENT:  Macrocytic anemia most likely secondary to iron deficiency anemia Blood loss maybe secondary to menorrhalgia and intermittent mild rectal bleeding from hemorrhoids.    PLAN:   Reviewed CBC with the patient. Her hemoglobin has increased from 8.4 g/dL to 11.9 g/dL today after 2 doses of feraheme in December. She continues to have ongoing bleeding with menorrhalgia despite uterine ablation. I will f/u on her iron levels today, if her ferritin is below 30, I will plan to give her some more feraheme. Continue ferrous sulfate 325mg  PO  BID with meals for now. RTC in 3 months with labs, CBC,CMP, iron studies. F/u with gynecologist regarding DUB after uterine ablation.  F/u with surgery regarding hemorrhoidectomy.    Patient expressed understanding and was in agreement with this plan. She also understands that She can call clinic at any time with any questions, concerns, or complaints.    This document serves as a record of services personally performed by Twana First, MD. It was created on her behalf by Shirlean Mylar, a trained medical scribe. The creation of this record is based on the scribe's personal observations and the provider's statements to them. This document has been checked and approved by the attending provider.  I have reviewed the above documentation for accuracy and completeness and I agree with the above.   [employer]   03/28/2016 2:35 PM

## 2016-04-23 NOTE — Patient Instructions (Addendum)
Your procedure is scheduled on: 05/01/2016  Report to [facility] at   7:00  AM.  Call this number if you have problems the morning of surgery: [phone]   Remember:   Do not drink or eat food:After Midnight.  :  Take these medicines the morning of surgery with A SIP OF WATER: Paxil   Do not wear jewelry, make-up or nail polish.  Do not wear lotions, powders, or perfumes. You may wear deodorant.  Do not shave 48 hours prior to surgery. Men may shave face and neck.  Do not bring valuables to the hospital.  Contacts, dentures or bridgework may not be worn into surgery.  Leave suitcase in the car. After surgery it may be brought to your room.  For patients admitted to the hospital, checkout time is 11:00 AM the day of discharge.   Patients discharged the day of surgery will not be allowed to drive home.    Special Instructions: Shower using CHG night before surgery and shower the day of surgery use CHG.  Use special wash - you have one bottle of CHG for all showers.  You should use approximately 1/2 of the bottle for each shower. Surgical Procedures for Hemorrhoids, Care After Refer to this sheet in the next few weeks. These instructions provide you with information about caring for yourself after your procedure. Your health care provider may also give you more specific instructions. Your treatment has been planned according to current medical practices, but problems sometimes occur. Call your health care provider if you have any problems or questions after your procedure. What can I expect after the procedure? After the procedure, it is common to have:  Rectal pain.  Pain when you are having a bowel movement.  Slight rectal bleeding. Follow these instructions at home: Medicines   Take over-the-counter and prescription medicines only as told by your health care provider.  Do not drive or operate heavy machinery while taking prescription pain medicine.  Use a stool softener  or a bulk laxative as told by your health care provider. Activity   Rest at home. Return to your normal activities as told by your health care provider.  Do not lift anything that is heavier than 10 lb (4.5 kg).  Do not sit for long periods of time. Take a walk every day or as told by your health care provider.  Do not strain to have a bowel movement. Do not spend a long time sitting on the toilet. Eating and drinking   Eat foods that contain fiber, such as whole grains, beans, nuts, fruits, and vegetables.  Drink enough fluid to keep your urine clear or pale yellow. General instructions   Sit in a warm bath 2-3 times per day to relieve soreness or itching.  Keep all follow-up visits as told by your health care provider. This is important. Contact a health care provider if:  Your pain medicine is not helping.  You have a fever or chills.  You become constipated.  You have trouble passing urine. Get help right away if:  You have very bad rectal pain.  You have heavy bleeding from your rectum. This information is not intended to replace advice given to you by your health care provider. Make sure you discuss any questions you have with your health care provider. Document Released: 03/30/2003 Document Revised: 06/15/2015 Document Reviewed: 04/04/2014 Elsevier Interactive Patient Education  2017 [employer] Anesthesia, Adult, Care After These instructions provide you with  information about caring for yourself after your procedure. Your health care provider may also give you more specific instructions. Your treatment has been planned according to current medical practices, but problems sometimes occur. Call your health care provider if you have any problems or questions after your procedure. What can I expect after the procedure? After the procedure, it is common to have:  Vomiting.  A sore throat.  Mental slowness. It is common to feel:  Nauseous.  Cold or  shivery.  Sleepy.  Tired.  Sore or achy, even in parts of your body where you did not have surgery. Follow these instructions at home: For at least 24 hours after the procedure:   Do not:  Participate in activities where you could fall or become injured.  Drive.  Use heavy machinery.  Drink alcohol.  Take sleeping pills or medicines that cause drowsiness.  Make important decisions or sign legal documents.  Take care of children on your own.  Rest. Eating and drinking   If you vomit, drink water, juice, or soup when you can drink without vomiting.  Drink enough fluid to keep your urine clear or pale yellow.  Make sure you have little or no nausea before eating solid foods.  Follow the diet recommended by your health care provider. General instructions   Have a responsible adult stay with you until you are awake and alert.  Return to your normal activities as told by your health care provider. Ask your health care provider what activities are safe for you.  Take over-the-counter and prescription medicines only as told by your health care provider.  If you smoke, do not smoke without supervision.  Keep all follow-up visits as told by your health care provider. This is important. Contact a health care provider if:  You continue to have nausea or vomiting at home, and medicines are not helpful.  You cannot drink fluids or start eating again.  You cannot urinate after 8-12 hours.  You develop a skin rash.  You have fever.  You have increasing redness at the site of your procedure. Get help right away if:  You have difficulty breathing.  You have chest pain.  You have unexpected bleeding.  You feel that you are having a life-threatening or urgent problem. This information is not intended to replace advice given to you by your health care provider. Make sure you discuss any questions you have with your health care provider. Document Released: 04/15/2000  Document Revised: 06/12/2015 Document Reviewed: 12/22/2014 Elsevier Interactive Patient Education  2017 [employer].

## 2016-04-25 ENCOUNTER — Encounter (HOSPITAL_COMMUNITY)
Admission: RE | Admit: 2016-04-25 | Discharge: 2016-04-25 | Disposition: A | Discharge status: Home / Self Care | Payer: 59 | Source: Ambulatory Visit | Attending: General Surgery | Admitting: General Surgery

## 2016-04-29 ENCOUNTER — Encounter (HOSPITAL_COMMUNITY): Payer: Self-pay

## 2016-04-29 ENCOUNTER — Encounter (HOSPITAL_COMMUNITY)
Admission: RE | Admit: 2016-04-29 | Discharge: 2016-04-29 | Disposition: A | Discharge status: Home / Self Care | Payer: 59 | Source: Ambulatory Visit | Attending: General Surgery | Admitting: General Surgery

## 2016-04-29 DIAGNOSIS — Z79899 Other long term (current) drug therapy: Secondary | ICD-10-CM | POA: Diagnosis not present

## 2016-04-29 DIAGNOSIS — D649 Anemia, unspecified: Secondary | ICD-10-CM | POA: Diagnosis not present

## 2016-04-29 DIAGNOSIS — F419 Anxiety disorder, unspecified: Secondary | ICD-10-CM | POA: Diagnosis not present

## 2016-04-29 DIAGNOSIS — E039 Hypothyroidism, unspecified: Secondary | ICD-10-CM | POA: Diagnosis not present

## 2016-04-29 DIAGNOSIS — K644 Residual hemorrhoidal skin tags: Secondary | ICD-10-CM | POA: Diagnosis not present

## 2016-04-29 DIAGNOSIS — Z7951 Long term (current) use of inhaled steroids: Secondary | ICD-10-CM | POA: Diagnosis not present

## 2016-04-29 DIAGNOSIS — K648 Other hemorrhoids: Secondary | ICD-10-CM | POA: Diagnosis not present

## 2016-04-29 LAB — BASIC METABOLIC PANEL
Anion gap: 8 (ref 5–15)
BUN: 11 mg/dL (ref 6–20)
CHLORIDE: 101 mmol/L (ref 101–111)
CO2: 27 mmol/L (ref 22–32)
CREATININE: 0.53 mg/dL (ref 0.44–1.00)
Calcium: 9.4 mg/dL (ref 8.9–10.3)
GFR calc Af Amer: >60 mL/min (ref >60)
GFR calc non Af Amer: >60 mL/min (ref >60)
GLUCOSE: 83 mg/dL (ref 65–99)
POTASSIUM: 3.9 mmol/L (ref 3.5–5.1)
Sodium: 136 mmol/L (ref 135–145)

## 2016-04-29 LAB — CBC WITH DIFFERENTIAL/PLATELET
Basophils Absolute: 0 10*3/uL (ref 0.0–0.1)
Basophils Relative: 0 %
EOS ABS: 0.1 10*3/uL (ref 0.0–0.7)
EOS PCT: 1 %
HCT: 37.9 % (ref 36.0–46.0)
Hemoglobin: 12.5 g/dL (ref 12.0–15.0)
LYMPHS ABS: 2 10*3/uL (ref 0.7–4.0)
LYMPHS PCT: 31 %
MCH: 26.5 pg (ref 26.0–34.0)
MCHC: 33 g/dL (ref 30.0–36.0)
MCV: 80.3 fL (ref 78.0–100.0)
MONOS PCT: 6 %
Monocytes Absolute: 0.4 10*3/uL (ref 0.1–1.0)
Neutro Abs: 4 10*3/uL (ref 1.7–7.7)
Neutrophils Relative %: 62 %
Platelets: 258 10*3/uL (ref 150–400)
RBC: 4.72 MIL/uL (ref 3.87–5.11)
RDW: 15.7 % — AB (ref 11.5–15.5)
WBC: 6.4 10*3/uL (ref 4.0–10.5)

## 2016-04-29 LAB — HCG, SERUM, QUALITATIVE: Preg, Serum: NEGATIVE

## 2016-04-29 NOTE — H&P (Signed)
Hannah Osborn; [id number]; 1985-11-20   HPI Patient is a 31 year old black female who was having intermittent episodes of hemorrhoidal bleeding over the last 9 years.  She has never had hemorrhoidal surgery.  She states she has tried various creams and suppositories  In the past. She has had intestinal issues currently is having multiple episodes of loose stools.  She denies constipation or incontinence.  She has no pain at the present time.  She was referred by Dr. Oneida Alar For evaluation and treatment of bleeding hemorrhoidal disease.  Patient has noted  Toilet paper when she wipes or so.  She has not passed clots.  He has been intermittent for many years.  Recently, it seems to be worsening.     Past Medical History:  Diagnosis Date  . Allergy   . Anemia   . Anxiety   . Fibroid 01/08/2016  . Hypothyroidism   . Thyroid disease          Past Surgical History:  Procedure Laterality Date  . CHOLECYSTECTOMY    . DILITATION & CURRETTAGE/HYSTROSCOPY WITH NOVASURE ABLATION N/A 02/02/2016   Procedure: DILATATION & CURETTAGE/HYSTEROSCOPY WITH NOVASURE ABLATION (procedure 2);  Surgeon: Florian Buff, MD;  Location: [facility];  Service: Gynecology;  Laterality: N/A;  . LAPAROSCOPIC BILATERAL SALPINGECTOMY Bilateral 02/02/2016   Procedure: LAPAROSCOPIC BILATERAL SALPINGECTOMY (procedure #1);  Surgeon: Florian Buff, MD;  Location: [facility];  Service: Gynecology;  Laterality: Bilateral;  . TONSILLECTOMY            Family History  Problem Relation Age of Onset  . Pulmonary embolism Mother   . Early death Mother   . Cancer Father     stomach  . Early death Father   . Asthma Son   . Allergies Son   . Heart disease Maternal Uncle   . Cancer Maternal Grandmother     uterine  . Cancer Maternal Grandfather     colon          Current Outpatient Prescriptions on File Prior to Visit  Medication Sig Dispense Refill  . dicyclomine (BENTYL) 10 MG capsule 1 PO 30  MINUTES PRIOR TO BREAKFAST AND IF NEEDED 30 MINS PRIOR TO  LUNCH 60 capsule 11  . ferrous sulfate 325 (65 FE) MG EC tablet Take 1 tablet (325 mg total) by mouth 2 (two) times daily. 100 tablet 3  . fluticasone (FLONASE) 50 MCG/ACT nasal spray Place 1 spray into both nostrils daily.    Marland Kitchen PARoxetine (PAXIL) 20 MG tablet 1/2 PO DAILY FOR 7 DAYS THEN ONE DAILY 30 tablet 11   No current facility-administered medications on file prior to visit.     No Known Allergies     History  Alcohol Use No       History  Smoking Status  . Never Smoker  Smokeless Tobacco  . Never Used    Review of Systems  Constitutional: Negative.   HENT: Positive for ear pain.   Eyes: Negative.   Respiratory: Negative.   Cardiovascular: Negative.   Gastrointestinal: Positive for abdominal pain and blood in stool.  Genitourinary: Positive for frequency.  Musculoskeletal: Negative.   Skin: Negative.   Neurological: Negative.   Endo/Heme/Allergies: Negative.   Psychiatric/Behavioral: Negative.     Objective      Vitals:   03/28/16 1152  BP: 128/72  Pulse: 81  Resp: 20  Temp: 98 F (36.7 C)    Physical Exam  Constitutional: She is oriented to person, place, and time and  well-developed, well-nourished, and in no distress.  HENT:  Head: Normocephalic and atraumatic.  Neck: Normal range of motion. Neck supple.  Cardiovascular: Normal rate and normal heart sounds.   Pulmonary/Chest: Effort normal and breath sounds normal. She has no wheezes. She has no rales.  Abdominal: Soft. She exhibits no distension. There is no tenderness.  Rectal examination reveals internal and external hemorrhoidal tissue with evidence of recent bleeding, grade 3/4 along the right side of the anus.  Lesser hemorrhoidal tissue noted along the left side of the anus.  Neurological: She is alert and oriented to person, place, and time.  Skin: Skin is warm and dry.  Vitals reviewed.   Assessment  Bleeding  internal and external hemorrhoidal disease Plan    patient will call to schedule extensive hemorrhoidectomy.  Risks and benefits of the procedure including bleeding, infection, possibly recurrence of hemorrhoidal disease were fully explained to the patient, who gave informed consent.  I did explain to the patient that I could not fully take care of all her hemorrhoidal issues.  I would take care of those that are actively causing her difficulty at this time.

## 2016-05-01 ENCOUNTER — Encounter (HOSPITAL_COMMUNITY): Payer: Self-pay | Admitting: *Deleted

## 2016-05-01 ENCOUNTER — Ambulatory Visit (HOSPITAL_COMMUNITY): Payer: 59 | Admitting: Anesthesiology

## 2016-05-01 ENCOUNTER — Ambulatory Visit (HOSPITAL_COMMUNITY)
Admission: RE | Admit: 2016-05-01 | Discharge: 2016-05-01 | Disposition: A | Discharge status: Home / Self Care | Payer: 59 | Source: Ambulatory Visit | Attending: General Surgery | Admitting: General Surgery

## 2016-05-01 ENCOUNTER — Encounter (HOSPITAL_COMMUNITY)
Admission: RE | Disposition: A | Discharge status: Home / Self Care | Payer: Self-pay | Source: Ambulatory Visit | Attending: General Surgery

## 2016-05-01 DIAGNOSIS — K648 Other hemorrhoids: Secondary | ICD-10-CM | POA: Diagnosis not present

## 2016-05-01 DIAGNOSIS — Z7951 Long term (current) use of inhaled steroids: Secondary | ICD-10-CM | POA: Diagnosis not present

## 2016-05-01 DIAGNOSIS — E039 Hypothyroidism, unspecified: Secondary | ICD-10-CM | POA: Insufficient documentation

## 2016-05-01 DIAGNOSIS — D649 Anemia, unspecified: Secondary | ICD-10-CM | POA: Diagnosis not present

## 2016-05-01 DIAGNOSIS — K644 Residual hemorrhoidal skin tags: Secondary | ICD-10-CM | POA: Diagnosis not present

## 2016-05-01 DIAGNOSIS — Z79899 Other long term (current) drug therapy: Secondary | ICD-10-CM | POA: Diagnosis not present

## 2016-05-01 DIAGNOSIS — F419 Anxiety disorder, unspecified: Secondary | ICD-10-CM | POA: Diagnosis not present

## 2016-05-01 DIAGNOSIS — K649 Unspecified hemorrhoids: Secondary | ICD-10-CM | POA: Diagnosis not present

## 2016-05-01 HISTORY — PX: HEMORRHOID SURGERY: SHX153

## 2016-05-01 SURGERY — HEMORRHOIDECTOMY
Anesthesia: General | Site: Rectum

## 2016-05-01 MED ORDER — CHLORHEXIDINE GLUCONATE CLOTH 2 % EX PADS: 6.0000 | MEDICATED_PAD | Freq: Once | CUTANEOUS | Status: DC

## 2016-05-01 MED ORDER — METRONIDAZOLE IN NACL 5-0.79 MG/ML-% IV SOLN
500.0000 mg | INTRAVENOUS | Status: AC
  Administered 2016-05-01: 500 mg via INTRAVENOUS
  Filled 2016-05-01: qty 100

## 2016-05-01 MED ORDER — MIDAZOLAM HCL 2 MG/2ML IJ SOLN
INTRAMUSCULAR | Status: AC
  Filled 2016-05-01: qty 2

## 2016-05-01 MED ORDER — SUCCINYLCHOLINE CHLORIDE 20 MG/ML IJ SOLN
INTRAMUSCULAR | Status: AC
  Filled 2016-05-01: qty 1

## 2016-05-01 MED ORDER — ONDANSETRON HCL 4 MG/2ML IJ SOLN
4.0000 mg | Freq: Once | INTRAMUSCULAR | Status: AC
  Administered 2016-05-01: 4 mg via INTRAVENOUS

## 2016-05-01 MED ORDER — LACTATED RINGERS IV SOLN
INTRAVENOUS | Status: DC
  Administered 2016-05-01: 08:00:00 via INTRAVENOUS

## 2016-05-01 MED ORDER — PROMETHAZINE HCL 25 MG/ML IJ SOLN
INTRAMUSCULAR | Status: AC
  Filled 2016-05-01: qty 1

## 2016-05-01 MED ORDER — PROMETHAZINE HCL 25 MG/ML IJ SOLN
6.2500 mg | INTRAMUSCULAR | Status: DC | PRN
  Administered 2016-05-01: 6.25 mg via INTRAVENOUS

## 2016-05-01 MED ORDER — KETOROLAC TROMETHAMINE 30 MG/ML IJ SOLN
INTRAMUSCULAR | Status: AC
  Filled 2016-05-01: qty 1

## 2016-05-01 MED ORDER — LIDOCAINE HCL (PF) 1 % IJ SOLN
INTRAMUSCULAR | Status: AC
  Filled 2016-05-01: qty 5

## 2016-05-01 MED ORDER — ONDANSETRON HCL 4 MG/2ML IJ SOLN
INTRAMUSCULAR | Status: AC
  Filled 2016-05-01: qty 2

## 2016-05-01 MED ORDER — OXYCODONE-ACETAMINOPHEN 7.5-325 MG PO TABS: 1.0000 | ORAL_TABLET | ORAL | 0 refills | Status: DC | PRN

## 2016-05-01 MED ORDER — BUPIVACAINE HCL (PF) 0.5 % IJ SOLN
INTRAMUSCULAR | Status: AC
  Filled 2016-05-01: qty 30

## 2016-05-01 MED ORDER — BUPIVACAINE HCL (PF) 0.5 % IJ SOLN
INTRAMUSCULAR | Status: DC | PRN
  Administered 2016-05-01: 7 mL

## 2016-05-01 MED ORDER — HYDROMORPHONE HCL 1 MG/ML IJ SOLN: 0.2500 mg | INTRAMUSCULAR | Status: DC | PRN

## 2016-05-01 MED ORDER — ONDANSETRON HCL 4 MG/2ML IJ SOLN
4.0000 mg | Freq: Once | INTRAMUSCULAR | Status: AC | PRN
  Administered 2016-05-01: 4 mg via INTRAVENOUS

## 2016-05-01 MED ORDER — LIDOCAINE VISCOUS 2 % MT SOLN
OROMUCOSAL | Status: DC | PRN
  Administered 2016-05-01: 1

## 2016-05-01 MED ORDER — KETOROLAC TROMETHAMINE 30 MG/ML IJ SOLN
30.0000 mg | Freq: Once | INTRAMUSCULAR | Status: AC
  Administered 2016-05-01: 30 mg via INTRAVENOUS

## 2016-05-01 MED ORDER — FENTANYL CITRATE (PF) 250 MCG/5ML IJ SOLN
INTRAMUSCULAR | Status: AC
  Filled 2016-05-01: qty 5

## 2016-05-01 MED ORDER — PROPOFOL 10 MG/ML IV BOLUS
INTRAVENOUS | Status: DC | PRN
  Administered 2016-05-01: 180 mg via INTRAVENOUS

## 2016-05-01 MED ORDER — ONDANSETRON 4 MG PO TBDP: 4.0000 mg | ORAL_TABLET | Freq: Three times a day (TID) | ORAL | 1 refills | Status: DC | PRN

## 2016-05-01 MED ORDER — LIDOCAINE VISCOUS 2 % MT SOLN
OROMUCOSAL | Status: AC
  Filled 2016-05-01: qty 15

## 2016-05-01 MED ORDER — PROPOFOL 10 MG/ML IV BOLUS
INTRAVENOUS | Status: AC
  Filled 2016-05-01: qty 20

## 2016-05-01 MED ORDER — LIDOCAINE HCL (CARDIAC) 10 MG/ML IV SOLN
INTRAVENOUS | Status: DC | PRN
  Administered 2016-05-01: 40 mg via INTRAVENOUS

## 2016-05-01 MED ORDER — MIDAZOLAM HCL 2 MG/2ML IJ SOLN
1.0000 mg | INTRAMUSCULAR | Status: AC
  Administered 2016-05-01 (×2): 2 mg via INTRAVENOUS
  Filled 2016-05-01: qty 2

## 2016-05-01 MED ORDER — 0.9 % SODIUM CHLORIDE (POUR BTL) OPTIME
TOPICAL | Status: DC | PRN
  Administered 2016-05-01: 1000 mL

## 2016-05-01 MED ORDER — FENTANYL CITRATE (PF) 100 MCG/2ML IJ SOLN
INTRAMUSCULAR | Status: DC | PRN
  Administered 2016-05-01 (×3): 25 ug via INTRAVENOUS
  Administered 2016-05-01: 50 ug via INTRAVENOUS

## 2016-05-01 SURGICAL SUPPLY — 28 items
BAG HAMPER (MISCELLANEOUS) ×2 IMPLANT
CLOTH BEACON ORANGE TIMEOUT ST (SAFETY) ×2 IMPLANT
COVER LIGHT HANDLE STERIS (MISCELLANEOUS) ×4 IMPLANT
DECANTER SPIKE VIAL GLASS SM (MISCELLANEOUS) ×2 IMPLANT
DRAPE PROXIMA HALF (DRAPES) ×2 IMPLANT
ELECT REM PT RETURN 9FT ADLT (ELECTROSURGICAL) ×2
ELECTRODE REM PT RTRN 9FT ADLT (ELECTROSURGICAL) ×1 IMPLANT
FORMALIN 10 PREFIL 120ML (MISCELLANEOUS) ×2 IMPLANT
GAUZE SPONGE 4X4 12PLY STRL (GAUZE/BANDAGES/DRESSINGS) ×2 IMPLANT
GLOVE BIOGEL PI IND STRL 7.0 (GLOVE) ×1 IMPLANT
GLOVE BIOGEL PI INDICATOR 7.0 (GLOVE) ×1
GLOVE SURG SS PI 7.5 STRL IVOR (GLOVE) ×4 IMPLANT
GOWN STRL REUS W/ TWL XL LVL3 (GOWN DISPOSABLE) ×1 IMPLANT
GOWN STRL REUS W/TWL LRG LVL3 (GOWN DISPOSABLE) ×2 IMPLANT
GOWN STRL REUS W/TWL XL LVL3 (GOWN DISPOSABLE) ×2
HEMOSTAT SURGICEL 4X8 (HEMOSTASIS) ×2 IMPLANT
KIT ROOM TURNOVER AP CYSTO (KITS) ×2 IMPLANT
LIGASURE IMPACT 36 18CM CVD LR (INSTRUMENTS) ×2 IMPLANT
MANIFOLD NEPTUNE II (INSTRUMENTS) ×2 IMPLANT
NEEDLE HYPO 25X1 1.5 SAFETY (NEEDLE) ×2 IMPLANT
NS IRRIG 1000ML POUR BTL (IV SOLUTION) ×2 IMPLANT
PACK PERI GYN (CUSTOM PROCEDURE TRAY) ×2 IMPLANT
PAD ARMBOARD 7.5X6 YLW CONV (MISCELLANEOUS) ×2 IMPLANT
SET BASIN LINEN APH (SET/KITS/TRAYS/PACK) ×2 IMPLANT
SURGILUBE 3G PEEL PACK STRL (MISCELLANEOUS) ×2 IMPLANT
SUT SILK 0 FSL (SUTURE) ×2 IMPLANT
SUT VIC AB 2-0 CT2 27 (SUTURE) ×2 IMPLANT
SYR CONTROL 10ML LL (SYRINGE) ×2 IMPLANT

## 2016-05-01 NOTE — Transfer of Care (Signed)
Immediate Anesthesia Transfer of Care Note  Patient: Hannah Osborn  Procedure(s) Performed: Procedure(s): EXTENSIVE HEMORRHOIDECTOMY (N/A)  Patient Location: PACU  Anesthesia Type:General  Level of Consciousness: awake  Airway & Oxygen Therapy: Patient Spontanous Breathing and Patient connected to face mask oxygen  Post-op Assessment: Report given to RN  Post vital signs: Reviewed and stable  Last Vitals:  Vitals:   05/01/16 0740  BP: 123/70  Pulse: 86  Resp: 18  Temp: 36.5 C    Last Pain:  Vitals:   05/01/16 0740  TempSrc: Oral      Patients Stated Pain Goal: 6 (17/49/44 9675)  Complications: No apparent anesthesia complications

## 2016-05-01 NOTE — Op Note (Signed)
Patient:  Hannah Osborn  DOB:  1985-09-13  MRN:  [id number]   Preop Diagnosis:  Internal and external hemorrhoidal disease  Postop Diagnosis:  Same  Procedure:  Extensive hemorrhoidectomy  Surgeon:  Aviva Signs, M.D.  Anes:  Gen.  Indications:  Patient is a 31 year old black female who presents with internal and external hemorrhoidal disease with bleeding. The risks and benefits of the procedure including bleeding, infection, and the possibility of recurrence of the hemorrhoidal disease were fully explained to the patient, who gave informed consent.  Procedure note:  The patient was placed in the lithotomy position after general anesthesia was administered. The perineum was prepped and draped using the usual sterile technique with Betadine. Surgical site confirmation was performed.  On rectal examination, the patient had a large internal and external hemorrhoid at the 10:00 position. A deeper internal hemorrhoid was noted at the 7:00 position. A small external hemorrhoidal skin tag was noted at the 6:00 position. The 10:00 and 7:00 hemorrhoids were excised using the LigaSure. This was done in a columnlike fashion. The external hemorrhoidal skin tag was excised using the LigaSure without difficulty. Care was taken to avoid the external sphincter mechanism. 0.5% Sensorcaine was instilled into the surrounding perineum. Surgicel and viscous Xylocaine rectal packing was then placed.  All tape and needle counts were correct at the end of the procedure. The patient was awakened and transferred to PACU in stable condition.  Complications:  None  EBL:  Minimal  Specimen:  Hemorrhoids

## 2016-05-01 NOTE — Anesthesia Procedure Notes (Signed)
Procedure Name: LMA Insertion Date/Time: 05/01/2016 8:38 AM Performed by: Tressie Stalker E Pre-anesthesia Checklist: Patient identified, Patient being monitored, Emergency Drugs available, Timeout performed and Suction available Patient Re-evaluated:Patient Re-evaluated prior to inductionOxygen Delivery Method: Circle System Utilized Preoxygenation: Pre-oxygenation with 100% oxygen Intubation Type: IV induction Ventilation: Mask ventilation without difficulty LMA: LMA inserted LMA Size: 4.0 Number of attempts: 1 Placement Confirmation: positive ETCO2 and breath sounds checked- equal and bilateral

## 2016-05-01 NOTE — Progress Notes (Signed)
Rectal packing d/c'd. No rectal drainage. Tolerated well.

## 2016-05-01 NOTE — Anesthesia Postprocedure Evaluation (Signed)
Anesthesia Post Note  Patient: Hannah Osborn  Procedure(s) Performed: Procedure(s) (LRB): EXTENSIVE HEMORRHOIDECTOMY (N/A)  Patient location during evaluation: PACU Anesthesia Type: General Level of consciousness: awake and alert and oriented Pain management: pain level controlled Vital Signs Assessment: post-procedure vital signs reviewed and stable Respiratory status: spontaneous breathing Cardiovascular status: blood pressure returned to baseline : pt. reported nausea, PACU medicating. Anesthetic complications: no     Last Vitals:  Vitals:   05/01/16 1010 05/01/16 1013  BP: 110/72 106/76  Pulse: 72 79  Resp: 12 16  Temp:  36.6 C    Last Pain:  Vitals:   05/01/16 1013  TempSrc: Oral  PainSc:                  Cathryn Gallery

## 2016-05-01 NOTE — Anesthesia Preprocedure Evaluation (Signed)
Anesthesia Evaluation  Patient identified by MRN, date of birth, ID band Patient awake    Reviewed: Allergy & Precautions, NPO status , Patient's Chart, lab work & pertinent test results  Airway Mallampati: II  TM Distance: >3 FB Neck ROM: Full    Dental  (+) Teeth Intact, Partial Upper   Pulmonary neg pulmonary ROS,    breath sounds clear to auscultation       Cardiovascular negative cardio ROS   Rhythm:Regular Rate:Normal     Neuro/Psych Anxiety    GI/Hepatic negative GI ROS,   Endo/Other  Hypothyroidism   Renal/GU      Musculoskeletal   Abdominal   Peds  Hematology  (+) anemia ,   Anesthesia Other Findings   Reproductive/Obstetrics                             Anesthesia Physical Anesthesia Plan  ASA: II  Anesthesia Plan: General   Post-op Pain Management:    Induction: Intravenous  Airway Management Planned: LMA  Additional Equipment:   Intra-op Plan:   Post-operative Plan: Extubation in OR  Informed Consent: I have reviewed the patients History and Physical, chart, labs and discussed the procedure including the risks, benefits and alternatives for the proposed anesthesia with the patient or authorized representative who has indicated his/her understanding and acceptance.     Plan Discussed with:   Anesthesia Plan Comments:         Anesthesia Quick Evaluation

## 2016-05-01 NOTE — Interval H&P Note (Signed)
History and Physical Interval Note:  05/01/2016 8:01 AM  Hannah Osborn  has presented today for surgery, with the diagnosis of bleeding hemorrhoids  The various methods of treatment have been discussed with the patient and family. After consideration of risks, benefits and other options for treatment, the patient has consented to  Procedure(s): EXTENSIVE HEMORRHOIDECTOMY (N/A) as a surgical intervention .  The patient's history has been reviewed, patient examined, no change in status, stable for surgery.  I have reviewed the patient's chart and labs.  Questions were answered to the patient's satisfaction.     Aviva Signs

## 2016-05-01 NOTE — Discharge Instructions (Signed)
Surgical Procedures for Hemorrhoids, Care After Refer to this sheet in the next few weeks. These instructions provide you with information about caring for yourself after your procedure. Your health care provider may also give you more specific instructions. Your treatment has been planned according to current medical practices, but problems sometimes occur. Call your health care provider if you have any problems or questions after your procedure. What can I expect after the procedure? After the procedure, it is common to have:  Rectal pain.  Pain when you are having a bowel movement.  Slight rectal bleeding. Follow these instructions at home: Medicines   Take over-the-counter and prescription medicines only as told by your health care provider.  Do not drive or operate heavy machinery while taking prescription pain medicine.  Use a stool softener or a bulk laxative as told by your health care provider. Activity   Rest at home. Return to your normal activities as told by your health care provider.  Do not lift anything that is heavier than 10 lb (4.5 kg).  Do not sit for long periods of time. Take a walk every day or as told by your health care provider.  Do not strain to have a bowel movement. Do not spend a long time sitting on the toilet. Eating and drinking   Eat foods that contain fiber, such as whole grains, beans, nuts, fruits, and vegetables.  Drink enough fluid to keep your urine clear or pale yellow. General instructions   Sit in a warm bath 2-3 times per day to relieve soreness or itching.  Keep all follow-up visits as told by your health care provider. This is important. Contact a health care provider if:  Your pain medicine is not helping.  You have a fever or chills.  You become constipated.  You have trouble passing urine. Get help right away if:  You have very bad rectal pain.  You have heavy bleeding from your rectum. This information is not  intended to replace advice given to you by your health care provider. Make sure you discuss any questions you have with your health care provider. Document Released: 03/30/2003 Document Revised: 06/15/2015 Document Reviewed: 04/04/2014 Elsevier Interactive Patient Education  2017 [employer].

## 2016-05-02 ENCOUNTER — Encounter (HOSPITAL_COMMUNITY): Payer: Self-pay | Admitting: General Surgery

## 2016-05-09 ENCOUNTER — Ambulatory Visit (INDEPENDENT_AMBULATORY_CARE_PROVIDER_SITE_OTHER): Payer: Self-pay | Admitting: General Surgery

## 2016-05-09 ENCOUNTER — Encounter: Payer: Self-pay | Admitting: General Surgery

## 2016-05-09 VITALS — BP 124/87 | HR 95 | Temp 98.4°F | Resp 18 | Ht 63.0 in | Wt 196.0 lb

## 2016-05-09 DIAGNOSIS — Z09 Encounter for follow-up examination after completed treatment for conditions other than malignant neoplasm: Secondary | ICD-10-CM

## 2016-05-09 MED ORDER — OXYCODONE-ACETAMINOPHEN 7.5-325 MG PO TABS: 1.0000 | ORAL_TABLET | ORAL | 0 refills | Status: DC | PRN

## 2016-05-09 NOTE — Patient Instructions (Signed)
How to Take a Sitz Bath A sitz bath is a warm water bath that is taken while you are sitting down. The water should only come up to your hips and should cover your buttocks. Your health care provider may recommend a sitz bath to help you:  Clean the lower part of your body, including your genital area.  With itching.  With pain.  With sore muscles or muscles that tighten or spasm. How to take a sitz bath Take 3-4 sitz baths per day or as told by your health care provider. 1. Partially fill a bathtub with warm water. You will only need the water to be deep enough to cover your hips and buttocks when you are sitting in it. 2. If your health care provider told you to put medicine in the water, follow the directions exactly. 3. Sit in the water and open the tub drain a little. 4. Turn on the warm water again to keep the tub at the correct level. Keep the water running constantly. 5. Soak in the water for 15-20 minutes or as told by your health care provider. 6. After the sitz bath, pat the affected area dry first. Do not rub it. 7. Be careful when you stand up after the sitz bath because you may feel dizzy. Contact a health care provider if:  Your symptoms get worse. Do not continue with sitz baths if your symptoms get worse.  You have new symptoms. Do not continue with sitz baths until you talk with your health care provider. This information is not intended to replace advice given to you by your health care provider. Make sure you discuss any questions you have with your health care provider. Document Released: 09/30/2003 Document Revised: 06/07/2015 Document Reviewed: 01/05/2014 Elsevier Interactive Patient Education  2017 [employer].

## 2016-05-09 NOTE — Progress Notes (Signed)
Subjective:     Hannah Osborn    Status post extensive hemorrhoidectomy.  Patient is having some rectal pain, but this is resolving.  She has minimal blood per rectum. Objective:    BP 124/87   Pulse 95   Temp 98.4 F (36.9 C)   Resp 18   Ht 5\' 3"  (1.6 m)   Wt 196 lb (88.9 kg)   LMP 04/25/2016   BMI 34.72 kg/m   General:  alert, cooperative and no distress    Rectum is healing well.  No active bleeding noted.     Assessment:    Doing well postoperatively.    Plan:     May soak in tub as needed.  May return to work on 05/20/2016.  Follow-up.  2 weeks.  Avoid straining or constipation.

## 2016-05-23 ENCOUNTER — Encounter: Payer: Self-pay | Admitting: General Surgery

## 2016-05-23 ENCOUNTER — Ambulatory Visit (INDEPENDENT_AMBULATORY_CARE_PROVIDER_SITE_OTHER): Payer: Self-pay | Admitting: General Surgery

## 2016-05-23 VITALS — BP 123/79 | HR 88 | Temp 97.5°F | Resp 18 | Ht 63.0 in | Wt 199.0 lb

## 2016-05-23 DIAGNOSIS — Z09 Encounter for follow-up examination after completed treatment for conditions other than malignant neoplasm: Secondary | ICD-10-CM

## 2016-05-23 NOTE — Progress Notes (Signed)
Subjective:     Hannah Osborn  Status post extensive hemorrhoidectomy. Doing well. She has no pain. Intermittent blood per rectum with bowel movements, though it is resolving. Objective:    BP 123/79   Pulse 88   Temp 97.5 F (36.4 C)   Resp 18   Ht 5\' 3"  (1.6 m)   Wt 199 lb (90.3 kg)   LMP 04/25/2016   BMI 35.25 kg/m   General:  alert, cooperative and no distress  Rectal examination reveals a well-healed surgical site. No bleeding noted.     Assessment:    Doing well postoperatively.    Plan:  Follow-up as needed.

## 2016-05-29 ENCOUNTER — Ambulatory Visit: Payer: 59 | Admitting: Gastroenterology

## 2016-06-06 ENCOUNTER — Ambulatory Visit (INDEPENDENT_AMBULATORY_CARE_PROVIDER_SITE_OTHER): Payer: 59 | Admitting: Family Medicine

## 2016-06-06 ENCOUNTER — Encounter: Payer: Self-pay | Admitting: Family Medicine

## 2016-06-06 VITALS — BP 128/88 | HR 88 | Temp 97.8°F | Resp 16 | Ht 63.0 in | Wt 198.0 lb

## 2016-06-06 DIAGNOSIS — N390 Urinary tract infection, site not specified: Secondary | ICD-10-CM

## 2016-06-06 DIAGNOSIS — R319 Hematuria, unspecified: Secondary | ICD-10-CM | POA: Diagnosis not present

## 2016-06-06 LAB — POCT URINALYSIS DIPSTICK
Bilirubin, UA: NEGATIVE
GLUCOSE UA: NEGATIVE
Ketones, UA: NEGATIVE
NITRITE UA: NEGATIVE
Spec Grav, UA: 1.015 (ref 1.010–1.025)
UROBILINOGEN UA: 0.2 U/dL
pH, UA: 6 (ref 5.0–8.0)

## 2016-06-06 MED ORDER — ONDANSETRON 4 MG PO TBDP: 4.0000 mg | ORAL_TABLET | Freq: Three times a day (TID) | ORAL | 1 refills | Status: DC | PRN

## 2016-06-06 MED ORDER — CIPROFLOXACIN HCL 500 MG PO TABS: 500.0000 mg | ORAL_TABLET | Freq: Two times a day (BID) | ORAL | 0 refills | Status: DC

## 2016-06-06 NOTE — Progress Notes (Signed)
Chief Complaint  Patient presents with  . Urinary Tract Infection  Patient is here for urinary tract infection. She's had burning upon urination for the last 2 or 3 days. She's been trying to drink a lot of fluids. She does have some lower abdominal pressure. No nausea or vomiting. No fever or flank pain. No history of kidney stones or kidney infections. She is certain that she is not pregnant. She recovered well after her hemorrhoid surgery. She continues to take a stool softener on a daily basis in order not to become constipated.  She is a history of anemia. Her last hemoglobin was 12.8. I told her she can take her iron every other day to every other day at this point. She finds it upsets her stomach. She is otherwise doing well and has no complaints.  Patient Active Problem List   Diagnosis Date Noted  . Internal and external bleeding hemorrhoids   . Diarrhea, functional 03/06/2016  . Rectal bleeding 03/06/2016  . Fibroid 01/08/2016  . Thyroid disorder 12/18/2015  . Interstitial cystitis 12/18/2015  . Anemia 12/18/2015  . Functional ovarian cysts 12/18/2015  . Menorrhagia 12/18/2015  . Class 1 obesity due to excess calories with body mass index (BMI) of 34.0 to 34.9 in adult 12/18/2015  . Environmental allergies 12/18/2015    Outpatient Encounter Prescriptions as of 06/06/2016  Medication Sig  . dicyclomine (BENTYL) 10 MG capsule 1 PO 30 MINUTES PRIOR TO BREAKFAST AND IF NEEDED 30 MINS PRIOR TO  LUNCH (Patient taking differently: Take 10 mg by mouth daily as needed. Take 1 tablet by mouth 30 minutes prior to lunch if needed for IBS)  . ferrous sulfate 325 (65 FE) MG EC tablet Take 1 tablet (325 mg total) by mouth 2 (two) times daily.  . fluticasone (FLONASE) 50 MCG/ACT nasal spray Place 2 sprays into both nostrils daily.   . ondansetron (ZOFRAN ODT) 4 MG disintegrating tablet Take 1 tablet (4 mg total) by mouth every 8 (eight) hours as needed for nausea or vomiting.  Marland Kitchen  PARoxetine (PAXIL) 20 MG tablet 1/2 PO DAILY FOR 7 DAYS THEN ONE DAILY (Patient taking differently: Take 20 mg by mouth daily. )  . ciprofloxacin (CIPRO) 500 MG tablet Take 1 tablet (500 mg total) by mouth 2 (two) times daily.   No facility-administered encounter medications on file as of 06/06/2016.     No Known Allergies  Review of Systems  Constitutional: Negative for chills and fever.  HENT: Negative.  Negative for congestion.   Eyes: Negative.  Negative for visual disturbance.  Respiratory: Negative for cough and shortness of breath.   Cardiovascular: Negative for chest pain and palpitations.  Gastrointestinal: Positive for nausea. Negative for abdominal distention and vomiting.  Genitourinary: Positive for dysuria and frequency. Negative for flank pain, hematuria and menstrual problem.  Musculoskeletal: Positive for arthralgias. Negative for back pain.  Neurological: Negative for dizziness and headaches.  Hematological: Negative for adenopathy. Does not bruise/bleed easily.    BP 128/88 (BP Location: Right Arm, Patient Position: Sitting, Cuff Size: Normal)   Pulse 88   Temp 97.8 F (36.6 C) (Temporal)   Resp 16   Ht 5\' 3"  (1.6 m)   Wt 198 lb (89.8 kg)   LMP 05/20/2016 (Exact Date)   SpO2 98%   BMI 35.07 kg/m   Physical Exam  Constitutional: She is oriented to person, place, and time. She appears well-developed and well-nourished. No distress.  HENT:  Head: Normocephalic.  Mouth/Throat: Oropharynx is  clear and moist.  Neck: Normal range of motion. No thyromegaly present.  Cardiovascular: Normal rate, regular rhythm and normal heart sounds.   Pulmonary/Chest: Effort normal and breath sounds normal.  Abdominal: Soft. Bowel sounds are normal. She exhibits no distension.  No CVA tenderness  Lymphadenopathy:    She has no cervical adenopathy.  Neurological: She is alert and oriented to person, place, and time.  Skin: Skin is warm and dry.  Psychiatric: She has a normal  mood and affect. Her behavior is normal.   Results for orders placed or performed in visit on 06/06/16  POCT urinalysis dipstick  Result Value Ref Range   Color, UA yellow    Clarity, UA cloudy    Glucose, UA neg    Bilirubin, UA neg    Ketones, UA neg    Spec Grav, UA 1.015 1.010 - 1.025   Blood, UA large    pH, UA 6.0 5.0 - 8.0   Protein, UA trace    Urobilinogen, UA 0.2 0.2 or 1.0 E.U./dL   Nitrite, UA neg    Leukocytes, UA Small (1+) (A) Negative    ASSESSMENT/PLAN:  1. Urinary tract infection with hematuria, site unspecified  - POCT urinalysis dipstick - Urine culture   Patient Instructions  Push fluids Take the antibiotic for a week Use nausea medicine if needed Off work today Call for problems    Raylene Everts, MD

## 2016-06-06 NOTE — Patient Instructions (Signed)
Push fluids Take the antibiotic for a week Use nausea medicine if needed Off work today Call for problems

## 2016-06-08 LAB — URINE CULTURE

## 2016-06-20 ENCOUNTER — Ambulatory Visit: Payer: 59 | Admitting: Gastroenterology

## 2016-06-28 ENCOUNTER — Ambulatory Visit (HOSPITAL_COMMUNITY): Payer: 59

## 2016-06-28 ENCOUNTER — Other Ambulatory Visit (HOSPITAL_COMMUNITY): Payer: 59

## 2016-09-12 ENCOUNTER — Ambulatory Visit: Payer: 59 | Admitting: Gastroenterology

## 2016-10-30 ENCOUNTER — Ambulatory Visit (INDEPENDENT_AMBULATORY_CARE_PROVIDER_SITE_OTHER): Payer: 59 | Admitting: Gastroenterology

## 2016-10-30 ENCOUNTER — Encounter: Payer: Self-pay | Admitting: Gastroenterology

## 2016-10-30 DIAGNOSIS — K648 Other hemorrhoids: Secondary | ICD-10-CM | POA: Diagnosis not present

## 2016-10-30 DIAGNOSIS — K644 Residual hemorrhoidal skin tags: Secondary | ICD-10-CM | POA: Diagnosis not present

## 2016-10-30 MED ORDER — DICYCLOMINE HCL 10 MG PO CAPS: ORAL_CAPSULE | ORAL | 11 refills | Status: DC

## 2016-10-30 MED ORDER — LIDOCAINE-HYDROCORTISONE ACE 3-2.5 % RE KIT: PACK | RECTAL | 0 refills | Status: DC

## 2016-10-30 NOTE — Assessment & Plan Note (Signed)
SYMPTOMS NOT CONTROLLED OR RESOLVED AFTER SURGERY.  USE [facility] CREAM FOUR TIMES A DAY FOR 10 DAYS. TO SOFTEN STOOL WITH COLACE THREE TIMES A DAY. RETURN TO SEE DR. Arnoldo Morale FOR FOLLOW UP. IF NEEDED USE DICYCLOMINE 10 MG TABLETS ONE OR TWO 30 MINUTES PRIOR MEALS UP TO THREE TIMES A DAY TO CONTROL DIARRHEA. IT MAY CAUSE DROWSINESS, DRY EYES/MOUTH, BLURRY VISION, OR DIFFICULTY URINATING.; PLEASE CALL WITH QUESTIONS OR CONCERNS.  FOLLOW UP IN 4 MOS.

## 2016-10-30 NOTE — Progress Notes (Signed)
cc'ed to pcp °

## 2016-10-30 NOTE — Patient Instructions (Addendum)
USE [facility] CREAM FOUR TIMES A DAY FOR 10 DAYS TO RELIEVE RECTAL PAIN/BLEEDING.  TO SOFTEN STOOL WITH COLACE THREE TIMES A DAY.  RETURN TO SEE DR. Arnoldo Morale FOR FOLLOW UP.  IF NEEDED USE DICYCLOMINE 10 MG TABLETS ONE OR TWO 30 MINUTES PRIOR MEALS UP TO THREE TIMES A DAY TO CONTROL DIARRHEA. IT MAY CAUSE DROWSINESS, DRY EYES/MOUTH, BLURRY VISION, OR DIFFICULTY URINATING.  PLEASE CALL WITH QUESTIONS OR CONCERNS.  FOLLOW UP IN 4 MOS.

## 2016-10-30 NOTE — Progress Notes (Signed)
   Subjective:    Patient ID: Hannah Osborn, female    DOB: [birth year], 31 y.o.   MRN: [id number]  Raylene Everts, MD   HPI AUNT PASSED AWAY AND STOPPED TAKING MEDS. HAD HEMORRHOID SURGERY: SEEMS [place]. SCARED TO GO TO BATHROOM IT FEELS MORE CONSTRICTED AND IT HURTS MORE AND BLEEDS MORE. DIDN'T GO BACK HIM. BMs: ALLOVER THE PLACE(CONSTIPATED THEN GO,GO GO-#5 MOSTLY AND SOMETIMES A #6, #4 BUT THIN. SEEING BLOOD EVERY TIME: STOOL BLOOD, WIPES. NAUSEOUS PAST 3 DAYS. HATES TO THROW UP SO NO VOMITING. HAS RECTAL PRESSURE, NOT REALLY ITCHING, OR BURNING, SOILING. FEELS A KNOT. IN PAIN A LONG TIME AFTER BM. NOT AS IN MUCH PAIN WITH WATERY V. SOLID. FEELS CONSTIPATED SOMETIMES. HEARTBURN: 1-2X/WEEK    PT DENIES FEVER, CHILLS, HEMATEMESIS, vomiting, melena, CHEST PAIN, SHORTNESS OF BREATH, CHANGE IN BOWEL IN HABITS, abdominal pain, problems swallowing, problems with sedation, OR heartburn or indigestion.  Past Medical History:  Diagnosis Date  . Allergy   . Anemia   . Anxiety   . Fibroid 01/08/2016  . Hypothyroidism   . Thyroid disease     Past Surgical History:  Procedure Laterality Date  . CHOLECYSTECTOMY    . DILITATION & CURRETTAGE/HYSTROSCOPY WITH NOVASURE ABLATION N/A 02/02/2016   Procedure: DILATATION & CURETTAGE/HYSTEROSCOPY WITH NOVASURE ABLATION (procedure 2);  Surgeon: Florian Buff, MD;  Location: [facility];  Service: Gynecology;  Laterality: N/A;  . HEMORRHOID SURGERY N/A 05/01/2016   Procedure: EXTENSIVE HEMORRHOIDECTOMY;  Surgeon: Aviva Signs, MD;  Location: [facility];  Service: General;  Laterality: N/A;  . LAPAROSCOPIC BILATERAL SALPINGECTOMY Bilateral 02/02/2016   Procedure: LAPAROSCOPIC BILATERAL SALPINGECTOMY (procedure #1);  Surgeon: Florian Buff, MD;  Location: [facility];  Service: Gynecology;  Laterality: Bilateral;  . TONSILLECTOMY      No Known Allergies  Current Outpatient Prescriptions  Medication Sig Dispense Refill  . bismuth subsalicylate (PEPTO BISMOL) 262  MG/15ML suspension Take 30 mLs by mouth every 6 (six) hours as needed.     Review of Systems PER HPI OTHERWISE ALL SYSTEMS ARE NEGATIVE.    Objective:   Physical Exam  Constitutional: She is oriented to person, place, and time. She appears well-developed and well-nourished. No distress.  HENT:  Head: Normocephalic and atraumatic.  Mouth/Throat: Oropharynx is clear and moist. No oropharyngeal exudate.  Eyes: Pupils are equal, round, and reactive to light. No scleral icterus.  Neck: Normal range of motion. Neck supple.  Cardiovascular: Normal rate, regular rhythm and normal heart sounds.   Pulmonary/Chest: Effort normal and breath sounds normal. No respiratory distress.  Abdominal: Soft. Bowel sounds are normal. She exhibits no distension. There is no tenderness.  Genitourinary: Rectal exam shows fissure.     Genitourinary Comments: NON-TENDER IN PERINEAL AREA  Musculoskeletal: She exhibits no edema.  Lymphadenopathy:    She has no cervical adenopathy.  Neurological: She is alert and oriented to person, place, and time.  NO FOCAL DEFICITS  Psychiatric: She has a normal mood and affect.  Vitals reviewed.     Assessment & Plan:

## 2016-10-31 NOTE — Progress Notes (Signed)
On recall  °

## 2016-11-19 ENCOUNTER — Ambulatory Visit (INDEPENDENT_AMBULATORY_CARE_PROVIDER_SITE_OTHER): Payer: 59 | Admitting: General Surgery

## 2016-11-19 ENCOUNTER — Encounter: Payer: Self-pay | Admitting: General Surgery

## 2016-11-19 VITALS — BP 127/89 | HR 103 | Temp 98.4°F | Resp 18 | Ht 63.0 in | Wt 194.0 lb

## 2016-11-19 DIAGNOSIS — K6289 Other specified diseases of anus and rectum: Secondary | ICD-10-CM | POA: Diagnosis not present

## 2016-11-19 NOTE — Progress Notes (Signed)
Subjective:     Hannah Osborn  Patient is status post extensive hemorrhoidectomy in April of this year.  She presents today with ongoing rectal pain when having bowel movements and having to strain when she moves her bowels.  She states this is been going on for several months.  She has noted blood on the toilet paper when she wipes herself.  She was seen by her GI doctor who referred her back to my care for evaluation.  She states that the rectal pain goes towards her tailbone.  She primarily has hard bowel movements, though some loose ones do occur.  She takes a stool softener every other day.  She gets scared about having bowel movements.  She currently has no pain. Objective:    BP 127/89   Pulse (!) 103   Temp 98.4 F (36.9 C)   Resp 18   Ht 5\' 3"  (1.6 m)   Wt 194 lb (88 kg)   BMI 34.37 kg/m   General:  alert, cooperative and no distress  Rectal examination reveals a small hemorrhoidal skin tag at the 8 o'clock position.  She did have a tight sphincter on digital examination which caused her discomfort, though I did notice some relaxation.  She also has a posterior anal fissure.  Some blood was noted on my finger after the exam.  I did not appreciate an anal stricture.     Assessment:    Anal fissure, external hemorrhoidal skin tag    Plan:   Anusol HC suppositories 1 per rectum twice a day for 2 weeks.  I also told her to increase her stool softener usage.  I think she is suffering from the anal fissure causing pain and internal sphincter tightness.  We will see how these conservative measures help her.  We will see her back again in 3 weeks.

## 2016-11-20 ENCOUNTER — Telehealth: Payer: Self-pay | Admitting: General Surgery

## 2016-11-21 ENCOUNTER — Encounter: Payer: Self-pay | Admitting: General Surgery

## 2016-11-21 NOTE — Telephone Encounter (Signed)
Reivew only

## 2016-12-10 ENCOUNTER — Ambulatory Visit: Payer: 59 | Admitting: General Surgery

## 2016-12-17 ENCOUNTER — Ambulatory Visit: Payer: 59 | Admitting: General Surgery

## 2016-12-17 ENCOUNTER — Encounter: Payer: Self-pay | Admitting: General Surgery

## 2016-12-17 VITALS — BP 131/88 | HR 93 | Temp 98.6°F | Resp 18 | Ht 63.0 in | Wt 183.0 lb

## 2016-12-17 DIAGNOSIS — K6289 Other specified diseases of anus and rectum: Secondary | ICD-10-CM

## 2016-12-17 NOTE — Progress Notes (Signed)
Subjective:     Hannah Osborn   Here for follow-up of rectal pain and blood per rectum secondary to healing anal fissure.  Patient states her pain has decreased.  She never did get the previous prescription filled.  She states her bowel movements are becoming a little more regular, but she still suffers from IBS.  Blood per rectum has decreased.  The pain with bowel movements is also decreased. Objective:    BP 131/88   Pulse 93   Temp 98.6 F (37 C)   Resp 18   Ht 5\' 3"  (1.6 m)   Wt 183 lb (83 kg)   BMI 32.42 kg/m   General:  alert, cooperative and no distress    Rectal examination not performed as patient is improving.     Assessment:    Rectal pain and bleeding, resolving, chronic in nature    Plan:    Patient is to continue her current medical regimen.  I told her that she could take sitz baths as needed.  If her pain persists, she was instructed to follow-up in my office in one to 2 months.  She will be following up with Dr. Oneida Alar for her IBS.

## 2016-12-19 ENCOUNTER — Telehealth: Payer: Self-pay | Admitting: Family Medicine

## 2016-12-19 NOTE — Telephone Encounter (Signed)
Patient wants to know if she has to have treatment (ER visit) for stomach pain before her next appt with Dr.Nelson (01/02/17) would Dr.Nelson fill out her fmla forms for her job. Pain has been going on over a month. She made appt this morning w/ Dr.Nelson. Cb# [phone]

## 2016-12-19 NOTE — Telephone Encounter (Signed)
Called and spoke to pt, states shes been having aching in her sides for a month. Would like to be put on a cancel list to come in sooner if possible,

## 2017-01-02 ENCOUNTER — Ambulatory Visit (INDEPENDENT_AMBULATORY_CARE_PROVIDER_SITE_OTHER): Payer: 59 | Admitting: Family Medicine

## 2017-01-02 ENCOUNTER — Encounter: Payer: Self-pay | Admitting: Family Medicine

## 2017-01-02 ENCOUNTER — Other Ambulatory Visit: Payer: Self-pay

## 2017-01-02 VITALS — BP 136/88 | HR 92 | Temp 98.7°F | Resp 18 | Ht 63.0 in | Wt 184.0 lb

## 2017-01-02 DIAGNOSIS — M5441 Lumbago with sciatica, right side: Secondary | ICD-10-CM | POA: Diagnosis not present

## 2017-01-02 DIAGNOSIS — G8929 Other chronic pain: Secondary | ICD-10-CM | POA: Diagnosis not present

## 2017-01-02 DIAGNOSIS — M545 Low back pain: Secondary | ICD-10-CM | POA: Diagnosis not present

## 2017-01-02 MED ORDER — CYCLOBENZAPRINE HCL 5 MG PO TABS: 5.0000 mg | ORAL_TABLET | Freq: Every day | ORAL | 1 refills | Status: DC

## 2017-01-02 MED ORDER — IBUPROFEN 800 MG PO TABS: 800.0000 mg | ORAL_TABLET | Freq: Three times a day (TID) | ORAL | 0 refills | Status: DC | PRN

## 2017-01-02 NOTE — Progress Notes (Signed)
Chief Complaint  Patient presents with  . Back Pain    x 1 month  Patient is here with a new complaint.  She states that she has back pain, off and on, fairly regularly for years.  She states that normally she can "tough it out".  She usually does not even take medication.  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  She states for the last month or 2 she has had increased back pain.  She feels tightness in the muscles in her low back.  She has increased pain in the right low back.  She has some pain into the right leg.  She is getting more cramps in the muscles of that leg and foot.  No numbness, no weakness.  No bowel or bladder complaint.  No injury.  She is never had x-rays or workup of her back pain.  No fever chills or urinary symptoms.  No history of malignancy.  Generally the pain feels like a "cramp" in the muscles.  Sometimes she gets stabbing pains.   Patient Active Problem List   Diagnosis Date Noted  . Internal and external bleeding hemorrhoids   . Diarrhea, functional 03/06/2016  . Rectal bleeding 03/06/2016  . Fibroid 01/08/2016  . Thyroid disorder 12/18/2015  . Interstitial cystitis 12/18/2015  . Anemia 12/18/2015  . Functional ovarian cysts 12/18/2015  . Menorrhagia 12/18/2015  . Class 1 obesity due to excess calories with body mass index (BMI) of 34.0 to 34.9 in adult 12/18/2015  . Environmental allergies 12/18/2015    Outpatient Encounter Medications as of 01/02/2017  Medication Sig  . bismuth subsalicylate (PEPTO BISMOL) 262 MG/15ML suspension Take 30 mLs by mouth every 6 (six) hours as needed.  . dicyclomine (BENTYL) 10 MG capsule 1 PO 30 MINUTES PRIOR TO BREAKFAST AND IF NEEDED 30 MINS PRIOR TO  LUNCH  . Lidocaine-Hydrocortisone Ace 3-2.5 % KIT APPLY TO RECTUM QID FOR 2 WEEKS  . cyclobenzaprine (FLEXERIL) 5 MG tablet Take 1 tablet (5 mg total) by mouth at bedtime. May take TID, caution drowsiness  .  ibuprofen (ADVIL,MOTRIN) 800 MG tablet Take 1 tablet (800 mg total) by mouth every 8 (eight) hours as needed for moderate pain.   No facility-administered encounter medications on file as of 01/02/2017.     No Known Allergies  Review of Systems  Constitutional: Negative for activity change, appetite change and unexpected weight change.  Gastrointestinal: Negative for constipation and diarrhea.  Genitourinary: Negative for difficulty urinating, enuresis and flank pain.  Musculoskeletal: Positive for back pain and myalgias.  Neurological: Negative for weakness and numbness.  All other systems reviewed and are negative.   BP 136/88 (BP Location: Left Arm, Patient Position: Sitting, Cuff Size: Normal)   Pulse 92   Temp 98.7 F (37.1 C) (Temporal)   Resp 18   Ht _0  (1.6 m)   Wt 184 lb 0.6 oz (83.5 kg)   SpO2 99%   BMI 32.60 kg/m   Physical Exam  Constitutional: She is oriented to person, place, and time. She appears well-developed and well-nourished. No distress.  HENT:  Head: Normocephalic and atraumatic.  Mouth/Throat: Oropharynx is clear and moist.  Neck: Normal range of motion. Neck supple.  Cardiovascular: Normal rate, regular rhythm and normal heart sounds.  Pulmonary/Chest: Effort normal and breath sounds normal.  Musculoskeletal: Normal range of motion.  Lumbar spine is straight and symmetric. Full range of motion.  Mild tenderness and increased tone of lumbar muscles.. Strength, sensation, range of motion, and reflexes are normal in both lower extremities. Straight leg raise is negative bilateral.  Patient can stand on heels and toes.  No pain with axial load or passive movement   Neurological: She is alert and oriented to person, place, and time. She displays normal reflexes. Coordination normal.  Psychiatric: She has a normal mood and affect. Her behavior is normal.    ASSESSMENT/PLAN:  1. Acute right-sided low back pain with right-sided sciatica Present for 1  month.  Untreated  2. Chronic low back pain, unspecified back pain laterality, with sciatica presence unspecified Patient complains of chronic pain over the years, never evaluated - DG Lumbar Spine Complete; Future   Patient Instructions  Back x ray ordered  Take the ibuprofen up to 3 times a day with food This is for pain  Take the flexeril at bedtime May take during day but watch for drowsiness This is a muscle relaxer  See me in 2-3 weeks   Back Exercises If you have pain in your back, do these exercises 2-3 times each day or as told by your doctor. When the pain goes away, do the exercises once each day, but repeat the steps more times for each exercise (do more repetitions). If you do not have pain in your back, do these exercises once each day or as told by your doctor. Exercises Single Knee to Chest  Do these steps 3-5 times in a row for each leg: 1. Lie on your back on a firm bed or the floor with your legs stretched out. 2. Bring one knee to your chest. 3. Hold your knee to your chest by grabbing your knee or thigh. 4. Pull on your knee until you feel a gentle stretch in your lower back. 5. Keep doing the stretch for 10-30 seconds. 6. Slowly let go of your leg and straighten it.  Pelvic Tilt  Do these steps 5-10 times in a row: 1. Lie on your back on a firm bed or the floor with your legs stretched out. 2. Bend your knees so they point up to the ceiling. Your feet should be flat on the floor. 3. Tighten your lower belly (abdomen) muscles to press your lower back against the floor. This will make your tailbone point up to the ceiling instead of pointing down to your feet or the floor. 4. Stay in this position for 5-10 seconds while you gently tighten your muscles and breathe evenly.  Cat-Cow  Do these steps until your lower back bends more easily: 1. Get on your hands and knees on a firm surface. Keep your hands under your shoulders, and keep your knees under your  hips. You may put padding under your knees. 2. Let your head hang down, and make your tailbone point down to the floor so your lower back is round like the back of a cat. 3. Stay in this position for 5 seconds. 4. Slowly lift your head and make your tailbone point up to the ceiling so your back hangs low (sags) like the back of a cow. 5. Stay in this position for 5 seconds.  Press-Ups  Do these steps 5-10 times in a row: 1. Lie on your belly (face-down) on the floor. 2. Place your hands near your head, about shoulder-width apart. 3. While you keep your back relaxed and keep your hips on the floor, slowly straighten your arms to raise the top half of  your body and lift your shoulders. Do not use your back muscles. To make yourself more comfortable, you may change where you place your hands. 4. Stay in this position for 5 seconds. 5. Slowly return to lying flat on the floor.  Bridges  Do these steps 10 times in a row: 1. Lie on your back on a firm surface. 2. Bend your knees so they point up to the ceiling. Your feet should be flat on the floor. 3. Tighten your butt muscles and lift your butt off of the floor until your waist is almost as high as your knees. If you do not feel the muscles working in your butt and the back of your thighs, slide your feet 1-2 inches farther away from your butt. 4. Stay in this position for 3-5 seconds. 5. Slowly lower your butt to the floor, and let your butt muscles relax.  If this exercise is too easy, try doing it with your arms crossed over your chest. Belly Crunches  Do these steps 5-10 times in a row: 1. Lie on your back on a firm bed or the floor with your legs stretched out. 2. Bend your knees so they point up to the ceiling. Your feet should be flat on the floor. 3. Cross your arms over your chest. 4. Tip your chin a little bit toward your chest but do not bend your neck. 5. Tighten your belly muscles and slowly raise your chest just enough to  lift your shoulder blades a tiny bit off of the floor. 6. Slowly lower your chest and your head to the floor.  Back Lifts Do these steps 5-10 times in a row: 1. Lie on your belly (face-down) with your arms at your sides, and rest your forehead on the floor. 2. Tighten the muscles in your legs and your butt. 3. Slowly lift your chest off of the floor while you keep your hips on the floor. Keep the back of your head in line with the curve in your back. Look at the floor while you do this. 4. Stay in this position for 3-5 seconds. 5. Slowly lower your chest and your face to the floor.  Contact a doctor if:  Your back pain gets a lot worse when you do an exercise.  Your back pain does not lessen 2 hours after you exercise. If you have any of these problems, stop doing the exercises. Do not do them again unless your doctor says it is okay. Get help right away if:  You have sudden, very bad back pain. If this happens, stop doing the exercises. Do not do them again unless your doctor says it is okay. This information is not intended to replace advice given to you by your health care provider. Make sure you discuss any questions you have with your health care provider. Document Released: 02/09/2010 Document Revised: 06/15/2015 Document Reviewed: 03/03/2014 Elsevier Interactive Patient Education  2018 [employer]    Raylene Everts, MD

## 2017-01-02 NOTE — Patient Instructions (Addendum)
Back x ray ordered  Take the ibuprofen up to 3 times a day with food This is for pain  Take the flexeril at bedtime May take during day but watch for drowsiness This is a muscle relaxer  See me in 2-3 weeks   Back Exercises If you have pain in your back, do these exercises 2-3 times each day or as told by your doctor. When the pain goes away, do the exercises once each day, but repeat the steps more times for each exercise (do more repetitions). If you do not have pain in your back, do these exercises once each day or as told by your doctor. Exercises Single Knee to Chest  Do these steps 3-5 times in a row for each leg: 1. Lie on your back on a firm bed or the floor with your legs stretched out. 2. Bring one knee to your chest. 3. Hold your knee to your chest by grabbing your knee or thigh. 4. Pull on your knee until you feel a gentle stretch in your lower back. 5. Keep doing the stretch for 10-30 seconds. 6. Slowly let go of your leg and straighten it.  Pelvic Tilt  Do these steps 5-10 times in a row: 1. Lie on your back on a firm bed or the floor with your legs stretched out. 2. Bend your knees so they point up to the ceiling. Your feet should be flat on the floor. 3. Tighten your lower belly (abdomen) muscles to press your lower back against the floor. This will make your tailbone point up to the ceiling instead of pointing down to your feet or the floor. 4. Stay in this position for 5-10 seconds while you gently tighten your muscles and breathe evenly.  Cat-Cow  Do these steps until your lower back bends more easily: 1. Get on your hands and knees on a firm surface. Keep your hands under your shoulders, and keep your knees under your hips. You may put padding under your knees. 2. Let your head hang down, and make your tailbone point down to the floor so your lower back is round like the back of a cat. 3. Stay in this position for 5 seconds. 4. Slowly lift your head and make  your tailbone point up to the ceiling so your back hangs low (sags) like the back of a cow. 5. Stay in this position for 5 seconds.  Press-Ups  Do these steps 5-10 times in a row: 1. Lie on your belly (face-down) on the floor. 2. Place your hands near your head, about shoulder-width apart. 3. While you keep your back relaxed and keep your hips on the floor, slowly straighten your arms to raise the top half of your body and lift your shoulders. Do not use your back muscles. To make yourself more comfortable, you may change where you place your hands. 4. Stay in this position for 5 seconds. 5. Slowly return to lying flat on the floor.  Bridges  Do these steps 10 times in a row: 1. Lie on your back on a firm surface. 2. Bend your knees so they point up to the ceiling. Your feet should be flat on the floor. 3. Tighten your butt muscles and lift your butt off of the floor until your waist is almost as high as your knees. If you do not feel the muscles working in your butt and the back of your thighs, slide your feet 1-2 inches farther away from your butt. 4.  Stay in this position for 3-5 seconds. 5. Slowly lower your butt to the floor, and let your butt muscles relax.  If this exercise is too easy, try doing it with your arms crossed over your chest. Belly Crunches  Do these steps 5-10 times in a row: 1. Lie on your back on a firm bed or the floor with your legs stretched out. 2. Bend your knees so they point up to the ceiling. Your feet should be flat on the floor. 3. Cross your arms over your chest. 4. Tip your chin a little bit toward your chest but do not bend your neck. 5. Tighten your belly muscles and slowly raise your chest just enough to lift your shoulder blades a tiny bit off of the floor. 6. Slowly lower your chest and your head to the floor.  Back Lifts Do these steps 5-10 times in a row: 1. Lie on your belly (face-down) with your arms at your sides, and rest your forehead  on the floor. 2. Tighten the muscles in your legs and your butt. 3. Slowly lift your chest off of the floor while you keep your hips on the floor. Keep the back of your head in line with the curve in your back. Look at the floor while you do this. 4. Stay in this position for 3-5 seconds. 5. Slowly lower your chest and your face to the floor.  Contact a doctor if:  Your back pain gets a lot worse when you do an exercise.  Your back pain does not lessen 2 hours after you exercise. If you have any of these problems, stop doing the exercises. Do not do them again unless your doctor says it is okay. Get help right away if:  You have sudden, very bad back pain. If this happens, stop doing the exercises. Do not do them again unless your doctor says it is okay. This information is not intended to replace advice given to you by your health care provider. Make sure you discuss any questions you have with your health care provider. Document Released: 02/09/2010 Document Revised: 06/15/2015 Document Reviewed: 03/03/2014 Elsevier Interactive Patient Education  [employer].

## 2017-01-03 ENCOUNTER — Ambulatory Visit (HOSPITAL_COMMUNITY)
Admission: RE | Admit: 2017-01-03 | Discharge: 2017-01-03 | Disposition: A | Discharge status: Home / Self Care | Payer: 59 | Source: Ambulatory Visit | Attending: Family Medicine | Admitting: Family Medicine

## 2017-01-03 DIAGNOSIS — G8929 Other chronic pain: Secondary | ICD-10-CM | POA: Diagnosis not present

## 2017-01-03 DIAGNOSIS — M545 Low back pain: Secondary | ICD-10-CM | POA: Insufficient documentation

## 2017-01-16 ENCOUNTER — Emergency Department (HOSPITAL_COMMUNITY)
Admission: EM | Admit: 2017-01-16 | Discharge: 2017-01-16 | Disposition: A | Discharge status: Home / Self Care | Payer: 59 | Attending: Emergency Medicine | Admitting: Emergency Medicine

## 2017-01-16 ENCOUNTER — Telehealth: Payer: Self-pay | Admitting: Family Medicine

## 2017-01-16 ENCOUNTER — Other Ambulatory Visit: Payer: Self-pay

## 2017-01-16 ENCOUNTER — Encounter (HOSPITAL_COMMUNITY): Payer: Self-pay

## 2017-01-16 DIAGNOSIS — Z79899 Other long term (current) drug therapy: Secondary | ICD-10-CM | POA: Diagnosis not present

## 2017-01-16 DIAGNOSIS — E039 Hypothyroidism, unspecified: Secondary | ICD-10-CM | POA: Insufficient documentation

## 2017-01-16 DIAGNOSIS — R300 Dysuria: Secondary | ICD-10-CM | POA: Diagnosis not present

## 2017-01-16 DIAGNOSIS — N3001 Acute cystitis with hematuria: Secondary | ICD-10-CM | POA: Insufficient documentation

## 2017-01-16 LAB — URINALYSIS, ROUTINE W REFLEX MICROSCOPIC
Bilirubin Urine: NEGATIVE
GLUCOSE, UA: NEGATIVE mg/dL
KETONES UR: NEGATIVE mg/dL
NITRITE: NEGATIVE
PROTEIN: NEGATIVE mg/dL
Specific Gravity, Urine: 1.016 (ref 1.005–1.030)
pH: 5 (ref 5.0–8.0)

## 2017-01-16 LAB — POC URINE PREG, ED: Preg Test, Ur: NEGATIVE

## 2017-01-16 MED ORDER — CEPHALEXIN 500 MG PO CAPS: 500.0000 mg | ORAL_CAPSULE | Freq: Four times a day (QID) | ORAL | 0 refills | Status: DC

## 2017-01-16 MED ORDER — CEPHALEXIN 500 MG PO CAPS
500.0000 mg | ORAL_CAPSULE | Freq: Once | ORAL | Status: AC
  Administered 2017-01-16: 500 mg via ORAL
  Filled 2017-01-16: qty 1

## 2017-01-16 MED ORDER — PHENAZOPYRIDINE HCL 200 MG PO TABS: 200.0000 mg | ORAL_TABLET | Freq: Three times a day (TID) | ORAL | 0 refills | Status: DC

## 2017-01-16 MED ORDER — PHENAZOPYRIDINE HCL 100 MG PO TABS
200.0000 mg | ORAL_TABLET | Freq: Once | ORAL | Status: AC
  Administered 2017-01-16: 200 mg via ORAL
  Filled 2017-01-16: qty 2

## 2017-01-16 NOTE — ED Triage Notes (Signed)
Reports of dysuria, urinary frequency and hematuria that started this morning. Denies n/v/d.

## 2017-01-16 NOTE — ED Provider Notes (Signed)
[facility] EMERGENCY DEPARTMENT Provider Note   CSN: [id number] Arrival date & time: 01/16/17  0923     History   Chief Complaint Chief Complaint  Patient presents with  . Dysuria    HPI Ivy Puryear Portman is a 31 y.o. female.  HPI   Carmellia Kreisler Darwin is a 31 y.o. female who presents to the Emergency Department complaining of urinary frequency, burning and urinary "pressure"  Symptoms began last evening and worse this morning.  She reports hx of frequent UTI's and states current symptoms feel similar to previous.  She also noticed some blood in her urine this morning.  She has not taken any medications for symptom relief.  Nothing makes her symptoms better or worse. She denies fever, chills, abdominal or back pain.  Also denies vaginal discharge, abnormal menses, and pelvic pain. No hx of kidney stones.    Past Medical History:  Diagnosis Date  . Allergy   . Anemia   . Anxiety   . Fibroid 01/08/2016  . Hypothyroidism   . Thyroid disease     Patient Active Problem List   Diagnosis Date Noted  . Internal and external bleeding hemorrhoids   . Diarrhea, functional 03/06/2016  . Rectal bleeding 03/06/2016  . Fibroid 01/08/2016  . Thyroid disorder 12/18/2015  . Interstitial cystitis 12/18/2015  . Anemia 12/18/2015  . Functional ovarian cysts 12/18/2015  . Menorrhagia 12/18/2015  . Class 1 obesity due to excess calories with body mass index (BMI) of 34.0 to 34.9 in adult 12/18/2015  . Environmental allergies 12/18/2015    Past Surgical History:  Procedure Laterality Date  . CHOLECYSTECTOMY    . DILITATION & CURRETTAGE/HYSTROSCOPY WITH NOVASURE ABLATION N/A 02/02/2016   Procedure: DILATATION & CURETTAGE/HYSTEROSCOPY WITH NOVASURE ABLATION (procedure 2);  Surgeon: Florian Buff, MD;  Location: [facility];  Service: Gynecology;  Laterality: N/A;  . HEMORRHOID SURGERY N/A 05/01/2016   Procedure: EXTENSIVE HEMORRHOIDECTOMY;  Surgeon: Aviva Signs, MD;  Location: [facility];  Service:  General;  Laterality: N/A;  . LAPAROSCOPIC BILATERAL SALPINGECTOMY Bilateral 02/02/2016   Procedure: LAPAROSCOPIC BILATERAL SALPINGECTOMY (procedure #1);  Surgeon: Florian Buff, MD;  Location: [facility];  Service: Gynecology;  Laterality: Bilateral;  . TONSILLECTOMY      OB History    Gravida Para Term Preterm AB Living   _0 SAB TAB Ectopic Multiple Live Births           1       Home Medications    Prior to Admission medications   Medication Sig Start Date End Date Taking? Authorizing Provider  bismuth subsalicylate (PEPTO BISMOL) 262 MG/15ML suspension Take 30 mLs by mouth every 6 (six) hours as needed.    [provider]  cyclobenzaprine (FLEXERIL) 5 MG tablet Take 1 tablet (5 mg total) by mouth at bedtime. May take TID, caution drowsiness 01/02/17   Raylene Everts, MD  dicyclomine (BENTYL) 10 MG capsule 1 PO 30 MINUTES PRIOR TO BREAKFAST AND IF NEEDED 30 MINS PRIOR TO  LUNCH 10/30/16   Fields, Marga Melnick, MD  ibuprofen (ADVIL,MOTRIN) 800 MG tablet Take 1 tablet (800 mg total) by mouth every 8 (eight) hours as needed for moderate pain. 01/02/17   Raylene Everts, MD  Lidocaine-Hydrocortisone Ace 3-2.5 % KIT APPLY TO RECTUM QID FOR 2 WEEKS 10/30/16   Danie Binder, MD    Family History Family History  Problem Relation Age of Onset  . Pulmonary embolism  Mother   . Early death Mother   . Cancer Father        stomach  . Early death Father   . Asthma Son   . Allergies Son   . Heart disease Maternal Uncle   . Cancer Maternal Grandmother        uterine  . Cancer Maternal Grandfather        colon    Social History Social History   Tobacco Use  . Smoking status: Never Smoker  . Smokeless tobacco: Never Used  Substance Use Topics  . Alcohol use: No  . Drug use: No     Allergies   Patient has no known allergies.   Review of Systems Review of Systems  Constitutional: Negative for activity change, appetite change, chills and fever.    Respiratory: Negative for chest tightness and shortness of breath.   Gastrointestinal: Negative for abdominal pain, nausea and vomiting.  Genitourinary: Positive for dysuria, frequency and urgency. Negative for decreased urine volume, difficulty urinating, flank pain, hematuria, pelvic pain and vaginal discharge.  Musculoskeletal: Negative for back pain.  Skin: Negative for rash.  Neurological: Negative for dizziness, weakness and numbness.  Hematological: Negative for adenopathy.  Psychiatric/Behavioral: Negative for confusion.  All other systems reviewed and are negative.    Physical Exam Updated Vital Signs BP 125/85 (BP Location: Right Arm)   Pulse 94   Temp 98.1 F (36.7 C) (Oral)   Resp 18   Ht _0  (1.6 m)   Wt 83 kg (183 lb)   LMP 12/31/2016   SpO2 99%   BMI 32.42 kg/m   Physical Exam  Constitutional: She is oriented to person, place, and time. She appears well-developed and well-nourished. No distress.  HENT:  Head: Normocephalic and atraumatic.  Mouth/Throat: Oropharynx is clear and moist.  Cardiovascular: Normal rate, regular rhythm and intact distal pulses.  No murmur heard. Pulmonary/Chest: Effort normal and breath sounds normal. No respiratory distress. She has no wheezes. She has no rales.  Abdominal: Soft. Normal appearance. She exhibits no distension and no mass. There is no hepatosplenomegaly. There is tenderness in the suprapubic area. There is no rigidity, no rebound, no guarding, no CVA tenderness and no tenderness at McBurney's point.  Mild ttp of the suprapubic region.  Remaining abdomen is soft, non-tender without guarding or rebound tenderness. No CVA tenderness  Musculoskeletal: Normal range of motion. She exhibits no edema.  Neurological: She is alert and oriented to person, place, and time. Coordination normal.  Skin: Skin is warm and dry. No rash noted.  Psychiatric: She has a normal mood and affect.  Nursing note and vitals reviewed.    ED  Treatments / Results  Labs (all labs ordered are listed, but only abnormal results are displayed) Labs Reviewed  URINALYSIS, ROUTINE W REFLEX MICROSCOPIC - Abnormal; Notable for the following components:      Result Value   APPearance HAZY (*)    Hgb urine dipstick LARGE (*)    Leukocytes, UA MODERATE (*)    Bacteria, UA RARE (*)    Squamous Epithelial / LPF 0-5 (*)    All other components within normal limits  URINE CULTURE  POC URINE PREG, ED    EKG  EKG Interpretation None       Radiology No results found.  Procedures Procedures (including critical care time)  Medications Ordered in ED Medications  cephALEXin (KEFLEX) capsule 500 mg (500 mg Oral Given 01/16/17 1053)  phenazopyridine (PYRIDIUM) tablet 200 mg (200 mg  Oral Given 01/16/17 1052)     Initial Impression / Assessment and Plan / ED Course  I have reviewed the triage vital signs and the nursing notes.  Pertinent labs & imaging results that were available during my care of the patient were reviewed by me and considered in my medical decision making (see chart for details).    Pt well appearing.  Non-toxic.  Likely recurrent UTI, no fever, CVA tenderness to suggest pyelo.  Symptoms are associated with urination, doubt kidney stone.  Will tx with Keflex, urine culture pending.  Appears stable for d/c home.  Return precautions discussed.    Final Clinical Impressions(s) / ED Diagnoses   Final diagnoses:  Acute cystitis with hematuria    ED Discharge Orders    None       Kem Parkinson, PA-C 01/16/17 1353    Mesner, Corene Cornea, MD 01/16/17 1524

## 2017-01-16 NOTE — Telephone Encounter (Signed)
The pt is calling advising that she has a bladder infection and would like something called in

## 2017-01-16 NOTE — Telephone Encounter (Signed)
Called and spoke to [place], she is currently at [facility] ED waiting to be seen for uti sx.

## 2017-01-16 NOTE — Discharge Instructions (Signed)
Drink plenty of water.  Take antibiotic as directed until its completed.  Follow-up with your doctor for recheck, return to the ER for any worsening symptoms such as increasing pain, fever, or vomiting.

## 2017-01-18 LAB — URINE CULTURE: CULTURE: NO GROWTH

## 2017-01-23 ENCOUNTER — Other Ambulatory Visit: Payer: Self-pay

## 2017-01-23 ENCOUNTER — Encounter: Payer: Self-pay | Admitting: Gastroenterology

## 2017-01-23 ENCOUNTER — Ambulatory Visit (INDEPENDENT_AMBULATORY_CARE_PROVIDER_SITE_OTHER): Payer: 59 | Admitting: Family Medicine

## 2017-01-23 ENCOUNTER — Encounter: Payer: Self-pay | Admitting: Family Medicine

## 2017-01-23 VITALS — BP 132/78 | HR 76 | Temp 98.0°F | Resp 16 | Ht 63.0 in | Wt 183.0 lb

## 2017-01-23 DIAGNOSIS — M545 Low back pain: Secondary | ICD-10-CM

## 2017-01-23 DIAGNOSIS — G8929 Other chronic pain: Secondary | ICD-10-CM | POA: Diagnosis not present

## 2017-01-23 DIAGNOSIS — M5441 Lumbago with sciatica, right side: Secondary | ICD-10-CM | POA: Diagnosis not present

## 2017-01-23 NOTE — Progress Notes (Signed)
Chief Complaint  Patient presents with  . Follow-up    3 week   Patient is here for routine follow-up for her back evaluation from 3 weeks ago.  She was started on ibuprofen and given Flexeril to use as needed.  She was told to do home exercises.  She was told to get an x-ray.  The x-rays reviewed with her today.  It is negative.  Her pain is a little bit better, but still present.  We discussed conservative management of back pain.  I recommend physical therapy.  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  I told her that this is acceptable, but if she does not see improvement, formal PT would probably be helpful.  If this does not work orthopedic referral is appropriate.  She has pain in her low back, worse at work, better at home.  Some radiation into the leg.  No numbness or weakness.  No bowel or bladder complaint.  Her pain level at her last visit was 8 and today is 6. She also went to the emergency room recently for urinary tract symptoms.  She had the sudden onset difficulty urinating, and dysuria.  Her urine test was positive for large amount of leukocytes and blood.  Interestingly, her culture was negative.  She was treated with antibiotics and now feels improved.  She is reminded that she should call the office and be seen here for routine complaints to save both time and money from an ER visit.  Patient Active Problem List   Diagnosis Date Noted  . Internal and external bleeding hemorrhoids   . Diarrhea, functional 03/06/2016  . Rectal bleeding 03/06/2016  . Fibroid 01/08/2016  . Thyroid disorder 12/18/2015  . Interstitial cystitis 12/18/2015  . Anemia 12/18/2015  . Functional ovarian cysts 12/18/2015  . Menorrhagia 12/18/2015  . Class 1 obesity due to excess calories with body mass index (BMI) of 34.0 to 34.9 in adult 12/18/2015  . Environmental  allergies 12/18/2015    Outpatient Encounter Medications as of 01/23/2017  Medication Sig  . cephALEXin (KEFLEX) 500 MG capsule Take 1 capsule (500 mg total) by mouth 4 (four) times daily. For 7 days  . cyclobenzaprine (FLEXERIL) 5 MG tablet Take 1 tablet (5 mg total) by mouth at bedtime. May take TID, caution drowsiness  . dicyclomine (BENTYL) 10 MG capsule 1 PO 30 MINUTES PRIOR TO BREAKFAST AND IF NEEDED 30 MINS PRIOR TO  LUNCH  . ibuprofen (ADVIL,MOTRIN) 800 MG tablet Take 1 tablet (800 mg total) by mouth every 8 (eight) hours as needed for moderate pain.  . phenazopyridine (PYRIDIUM) 200 MG tablet Take 1 tablet (200 mg total) by mouth 3 (three) times daily.   No facility-administered encounter medications on file as of 01/23/2017.     No Known Allergies  Review of Systems  Constitutional: Negative for activity change, appetite change and unexpected weight change.  Gastrointestinal: Negative for constipation and diarrhea.  Genitourinary: Negative for difficulty urinating, enuresis and flank pain.       Urinary complaints have resolved  Musculoskeletal: Positive for back pain and myalgias.       Mildly improved from before  Neurological: Negative for weakness and numbness.  All other systems reviewed and are negative.   BP 132/78 (BP Location: Left Arm, Patient Position: Sitting, Cuff Size: Normal)   Pulse 76  Temp 98 F (36.7 C) (Temporal)   Resp 16   Ht 5\' 3"  (1.6 m)   Wt 183 lb 0.6 oz (83 kg)   LMP 12/31/2016   SpO2 98%   BMI 32.42 kg/m   Physical Exam  Constitutional: She is oriented to person, place, and time. She appears well-developed and well-nourished. No distress.  HENT:  Head: Normocephalic and atraumatic.  Mouth/Throat: Oropharynx is clear and moist.  Neck: Normal range of motion. Neck supple.  Cardiovascular: Normal rate, regular rhythm and normal heart sounds.  Pulmonary/Chest: Effort normal and breath sounds normal.  Musculoskeletal: Normal range of  motion.  Lumbar spine is straight and symmetric. Full range of motion.  Minimal tenderness  of lumbar muscles.. Strength, sensation, range of motion, and reflexes are normal in both lower extremities. Straight leg raise is negative bilateral.  Patient can stand on heels and toes.  No pain with axial load or passive movement   Neurological: She is alert and oriented to person, place, and time. She displays normal reflexes. Coordination normal.  Psychiatric: She has a normal mood and affect. Her behavior is normal.    ASSESSMENT/PLAN:  1. Acute right-sided low back pain with right-sided sciatica Mildly improved.  Discussed management  2. Chronic low back pain, unspecified back pain laterality, with sciatica presence unspecified Diet, exercise, daily walking treatment of choice  Patient also mentioned that she is having some irregular vaginal bleeding since her ablation.  She is advised to go back to her gynecologist for advice.  She also has intermittent abdominal pain that is central.  She does have irritable bowel syndrome and this is not unexpected.  She is not having any constipation diarrhea or severe pain. she is under the care of gastroenterology.  Her weight is stable.  Patient Instructions  Talk to a physical therapist about a home exercise program Walk every day that you are able  Call me if not better in 2-3 weeks May need ortho consultation   Raylene Everts, MD

## 2017-01-23 NOTE — Patient Instructions (Signed)
Talk to a physical therapist about a home exercise program Walk every day that you are able  Call me if not better in 2-3 weeks May need ortho consultation

## 2017-01-28 ENCOUNTER — Telehealth: Payer: Self-pay

## 2017-01-28 ENCOUNTER — Telehealth: Payer: Self-pay | Admitting: Family Medicine

## 2017-01-28 NOTE — Telephone Encounter (Signed)
Patient left message asking if FMLA forms were received?

## 2017-01-28 NOTE — Telephone Encounter (Signed)
Pt called and wants update on her FMLA paperwork.  Have you received it?

## 2017-01-28 NOTE — Telephone Encounter (Signed)
I have not seen this

## 2017-01-28 NOTE — Telephone Encounter (Signed)
I called pt and told her Hannah Osborn said she has not seen the form

## 2017-01-29 NOTE — Telephone Encounter (Signed)
In dr Thera Flake office

## 2017-01-30 ENCOUNTER — Encounter: Payer: Self-pay | Admitting: Family Medicine

## 2017-02-03 ENCOUNTER — Telehealth: Payer: Self-pay | Admitting: Family Medicine

## 2017-02-03 NOTE — Telephone Encounter (Signed)
Patient called in to check the status of her fmla forms Cb#: [phone]

## 2017-02-03 NOTE — Telephone Encounter (Signed)
Selena, please check.  I thought these were done

## 2017-02-04 NOTE — Telephone Encounter (Signed)
In your office,

## 2017-03-14 ENCOUNTER — Encounter: Payer: Self-pay | Admitting: Family Medicine

## 2017-03-14 ENCOUNTER — Ambulatory Visit (INDEPENDENT_AMBULATORY_CARE_PROVIDER_SITE_OTHER): Payer: 59 | Admitting: Family Medicine

## 2017-03-14 ENCOUNTER — Other Ambulatory Visit (HOSPITAL_COMMUNITY)
Admission: RE | Admit: 2017-03-14 | Discharge: 2017-03-14 | Disposition: A | Discharge status: Home / Self Care | Payer: 59 | Source: Ambulatory Visit | Attending: Family Medicine | Admitting: Family Medicine

## 2017-03-14 ENCOUNTER — Other Ambulatory Visit: Payer: Self-pay

## 2017-03-14 VITALS — BP 118/74 | HR 76 | Temp 98.9°F | Resp 16 | Ht 63.0 in | Wt 170.1 lb

## 2017-03-14 DIAGNOSIS — Z113 Encounter for screening for infections with a predominantly sexual mode of transmission: Secondary | ICD-10-CM | POA: Insufficient documentation

## 2017-03-14 NOTE — Progress Notes (Signed)
Chief Complaint  Patient presents with  . Exposure to STD   See above patient is here for evaluation for Possible STD exposure. She has had a boyfriend for 17 years that she is seeing consistently.  She found out recently that he has another girlfriend, and has since last October.  She has broken off relations with him, but wants to be tested for STDs.  She did have a Pap smear with a negative HPV 2 months after he started seeing this other lady.  I did tell her that we can test on a urine sample and blood work for everything but HPV, she should go back to her GYN for another Pap and HPV test since it has been 2 years. She is having no symptoms.  We discussed available testing.  Discussed briefly STD prevention.  Patient Active Problem List   Diagnosis Date Noted  . Internal and external bleeding hemorrhoids   . Diarrhea, functional 03/06/2016  . Rectal bleeding 03/06/2016  . Fibroid 01/08/2016  . Thyroid disorder 12/18/2015  . Interstitial cystitis 12/18/2015  . Anemia 12/18/2015  . Functional ovarian cysts 12/18/2015  . Menorrhagia 12/18/2015  . Class 1 obesity due to excess calories with body mass index (BMI) of 34.0 to 34.9 in adult 12/18/2015  . Environmental allergies 12/18/2015    Outpatient Encounter Medications as of 03/14/2017  Medication Sig  . ibuprofen (ADVIL,MOTRIN) 800 MG tablet Take 1 tablet (800 mg total) by mouth every 8 (eight) hours as needed for moderate pain.   No facility-administered encounter medications on file as of 03/14/2017.     No Known Allergies  Review of Systems  Constitutional: Negative for activity change, appetite change and unexpected weight change.  HENT: Negative for congestion, dental problem, postnasal drip and rhinorrhea.   Eyes: Negative for redness and visual disturbance.  Respiratory: Negative for cough and shortness of breath.   Cardiovascular: Negative for chest pain, palpitations and leg swelling.  Gastrointestinal: Negative  for abdominal pain, constipation and diarrhea.  Genitourinary: Negative for difficulty urinating, frequency and menstrual problem.       Heavy periods  Musculoskeletal: Negative for arthralgias and back pain.  Neurological: Negative for dizziness and headaches.  Psychiatric/Behavioral: Negative for dysphoric mood and sleep disturbance. The patient is not nervous/anxious.        Upset with recent events    BP 118/74 (BP Location: Left Arm, Patient Position: Sitting, Cuff Size: Normal)   Pulse 76   Temp 98.9 F (37.2 C) (Temporal)   Resp 16   Ht 5\' 3"  (1.6 m)   Wt 170 lb 1.3 oz (77.1 kg)   BMI 30.13 kg/m   Physical Exam  Constitutional: She is oriented to person, place, and time. She appears well-developed and well-nourished.  HENT:  Head: Normocephalic and atraumatic.  Eyes: Conjunctivae are normal. Pupils are equal, round, and reactive to light.  Neck: Normal range of motion. Neck supple.  Cardiovascular: Normal rate, regular rhythm and normal heart sounds.  Pulmonary/Chest: Effort normal and breath sounds normal. No respiratory distress.  Abdominal: Soft. Bowel sounds are normal. There is no tenderness.  Neurological: She is alert and oriented to person, place, and time.  Gait normal  Skin: Skin is warm and dry.  Psychiatric: She has a normal mood and affect. Her behavior is normal. Thought content normal.  Nursing note and vitals reviewed.   ASSESSMENT/PLAN:  1. Screen for STD (sexually transmitted disease)  - Urine cytology ancillary only - HIV antibody - RPR  Patient Instructions  We will do appropriate testing I will send you a letter with your test results.  If there is anything of concern, we will call right away.       Raylene Everts, MD

## 2017-03-14 NOTE — Patient Instructions (Signed)
We will do appropriate testing I will send you a letter with your test results.  If there is anything of concern, we will call right away.

## 2017-03-15 LAB — HIV ANTIBODY (ROUTINE TESTING W REFLEX): HIV 1&2 Ab, 4th Generation: NONREACTIVE

## 2017-03-15 LAB — RPR: RPR Ser Ql: NONREACTIVE

## 2017-03-17 LAB — URINE CYTOLOGY ANCILLARY ONLY
CHLAMYDIA, DNA PROBE: NEGATIVE
NEISSERIA GONORRHEA: NEGATIVE
Trichomonas: NEGATIVE

## 2017-03-19 LAB — URINE CYTOLOGY ANCILLARY ONLY: Candida vaginitis: NEGATIVE

## 2017-04-01 ENCOUNTER — Other Ambulatory Visit: Payer: 59 | Admitting: Adult Health

## 2017-04-01 ENCOUNTER — Other Ambulatory Visit (HOSPITAL_COMMUNITY)
Admission: RE | Admit: 2017-04-01 | Discharge: 2017-04-01 | Disposition: A | Discharge status: Home / Self Care | Payer: 59 | Source: Ambulatory Visit | Attending: Adult Health | Admitting: Adult Health

## 2017-04-01 ENCOUNTER — Encounter: Payer: Self-pay | Admitting: Adult Health

## 2017-04-01 ENCOUNTER — Ambulatory Visit (INDEPENDENT_AMBULATORY_CARE_PROVIDER_SITE_OTHER): Payer: 59 | Admitting: Adult Health

## 2017-04-01 VITALS — BP 130/78 | HR 76 | Ht 63.0 in | Wt 178.0 lb

## 2017-04-01 DIAGNOSIS — R5383 Other fatigue: Secondary | ICD-10-CM

## 2017-04-01 DIAGNOSIS — Z1212 Encounter for screening for malignant neoplasm of rectum: Secondary | ICD-10-CM

## 2017-04-01 DIAGNOSIS — Z01419 Encounter for gynecological examination (general) (routine) without abnormal findings: Secondary | ICD-10-CM | POA: Diagnosis not present

## 2017-04-01 DIAGNOSIS — Z01411 Encounter for gynecological examination (general) (routine) with abnormal findings: Secondary | ICD-10-CM

## 2017-04-01 DIAGNOSIS — Z1211 Encounter for screening for malignant neoplasm of colon: Secondary | ICD-10-CM

## 2017-04-01 DIAGNOSIS — N946 Dysmenorrhea, unspecified: Secondary | ICD-10-CM | POA: Diagnosis not present

## 2017-04-01 DIAGNOSIS — Z13220 Encounter for screening for lipoid disorders: Secondary | ICD-10-CM

## 2017-04-01 DIAGNOSIS — N920 Excessive and frequent menstruation with regular cycle: Secondary | ICD-10-CM | POA: Diagnosis not present

## 2017-04-01 MED ORDER — NORETHIN-ETH ESTRAD-FE BIPHAS 1 MG-10 MCG / 10 MCG PO TABS: 1.0000 | ORAL_TABLET | Freq: Every day | ORAL | 0 refills | Status: DC

## 2017-04-01 NOTE — Progress Notes (Signed)
Patient ID: Hannah Osborn, female   DOB: [birth year], 32 y.o.   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 She had HIV and RPR in February checked and they were negative.  PCP is Dr Meda Coffee.   Current Medications, Allergies, Past Medical History, Past Surgical History, Family History and Social History were reviewed in [employer] record.     Review of Systems:  Patient denies any headaches, hearing loss, blurred vision, shortness of breath, chest pain, abdominal pain, problems with bowel movements, urination, or intercourse. No joint pain or mood swings.See HPI for positives.   Physical Exam:BP 130/78 (BP Location: Left Arm, Patient Position: Sitting, Cuff Size: Normal)   Pulse 76   Ht 5\' 3"  (1.6 m)   Wt 178 lb (80.7 kg)   LMP 03/09/2017 (Exact Date)   BMI 31.53 kg/m  General:  Well developed, well nourished, no acute distress Skin:  Warm and dry Neck:  Midline trachea, normal thyroid, good ROM, no lymphadenopathy Lungs; Clear to auscultation bilaterally Breast:  No dominant palpable mass, retraction, or nipple discharge Cardiovascular: Regular rate and rhythm Abdomen:  Soft, non tender, no hepatosplenomegaly Pelvic:  External genitalia is normal in appearance, no lesions.Has area of vitiligo right groin area.   The vagina is normal in appearance. Urethra has no lesions or masses. The cervix is bulbous. Pap with HPV and GC/CHL performed.  Uterus is felt to be normal size, shape, and contour.  No adnexal masses or tenderness noted.Bladder is non tender, no masses felt. Extremities/musculoskeletal:  No swelling or varicosities noted, no clubbing or cyanosis Psych:  No mood changes, alert and cooperative,seems happy PHQ 2 score  0. Will try low dose pill to see if helps with period.   Impression: 1. Encounter for gynecological examination with Papanicolaou smear of cervix   2. Screening for colorectal cancer   3. Menorrhagia with regular cycle   4. Dysmenorrhea   5. Screening cholesterol level   6. Tired       Plan: Check CBC,CMP,TSH and lipids Given 3 packs of lo loestrin to try, start with next period, take 1 daily F/U in 3 months Physical in 1 year Pap in 3 if normal

## 2017-04-02 LAB — LIPID PANEL
CHOL/HDL RATIO: 2.9 ratio (ref 0.0–4.4)
CHOLESTEROL TOTAL: 227 mg/dL — AB (ref 100–199)
HDL: 78 mg/dL (ref >39)
LDL CALC: 138 mg/dL — AB (ref 0–99)
TRIGLYCERIDES: 55 mg/dL (ref 0–149)
VLDL CHOLESTEROL CAL: 11 mg/dL (ref 5–40)

## 2017-04-02 LAB — CBC
HEMATOCRIT: 36.4 % (ref 34.0–46.6)
Hemoglobin: 11.5 g/dL (ref 11.1–15.9)
MCH: 23.9 pg — ABNORMAL LOW (ref 26.6–33.0)
MCHC: 31.6 g/dL (ref 31.5–35.7)
MCV: 76 fL — AB (ref 79–97)
PLATELETS: 296 10*3/uL (ref 150–379)
RBC: 4.81 x10E6/uL (ref 3.77–5.28)
RDW: 16 % — AB (ref 12.3–15.4)
WBC: 5.9 10*3/uL (ref 3.4–10.8)

## 2017-04-02 LAB — COMPREHENSIVE METABOLIC PANEL
A/G RATIO: 1.7 (ref 1.2–2.2)
ALT: 13 IU/L (ref 0–32)
AST: 14 IU/L (ref 0–40)
Albumin: 4.2 g/dL (ref 3.5–5.5)
Alkaline Phosphatase: 70 IU/L (ref 39–117)
BUN/Creatinine Ratio: 10 (ref 9–23)
BUN: 6 mg/dL (ref 6–20)
Bilirubin Total: <0.2 mg/dL (ref 0.0–1.2)
CALCIUM: 9.4 mg/dL (ref 8.7–10.2)
CO2: 24 mmol/L (ref 20–29)
CREATININE: 0.63 mg/dL (ref 0.57–1.00)
Chloride: 104 mmol/L (ref 96–106)
GFR, EST AFRICAN AMERICAN: 138 mL/min/{1.73_m2} (ref >59)
GFR, EST NON AFRICAN AMERICAN: 120 mL/min/{1.73_m2} (ref >59)
Globulin, Total: 2.5 g/dL (ref 1.5–4.5)
Glucose: 78 mg/dL (ref 65–99)
POTASSIUM: 4.3 mmol/L (ref 3.5–5.2)
Sodium: 142 mmol/L (ref 134–144)
TOTAL PROTEIN: 6.7 g/dL (ref 6.0–8.5)

## 2017-04-02 LAB — TSH: TSH: 4.93 u[IU]/mL — ABNORMAL HIGH (ref 0.450–4.500)

## 2017-04-03 ENCOUNTER — Encounter: Payer: Self-pay | Admitting: Adult Health

## 2017-04-03 ENCOUNTER — Telehealth: Payer: Self-pay | Admitting: Adult Health

## 2017-04-03 DIAGNOSIS — E039 Hypothyroidism, unspecified: Secondary | ICD-10-CM

## 2017-04-03 HISTORY — DX: Hypothyroidism, unspecified: E03.9

## 2017-04-03 LAB — CYTOLOGY - PAP
CHLAMYDIA, DNA PROBE: NEGATIVE
Diagnosis: NEGATIVE
HPV: NOT DETECTED
NEISSERIA GONORRHEA: NEGATIVE

## 2017-04-03 MED ORDER — LEVOTHYROXINE SODIUM 50 MCG PO TABS: 50.0000 ug | ORAL_TABLET | Freq: Every day | ORAL | 2 refills | Status: DC

## 2017-04-03 NOTE — Telephone Encounter (Signed)
Pt aware of labs and that TSH slightly elevated, will add synthroid 50 mcg daily and recheck TSH in 3 months, placed in recall

## 2017-04-22 ENCOUNTER — Telehealth: Payer: Self-pay | Admitting: Obstetrics & Gynecology

## 2017-04-22 DIAGNOSIS — R102 Pelvic and perineal pain: Secondary | ICD-10-CM

## 2017-04-22 NOTE — Telephone Encounter (Signed)
Pt on OCs, started period yesterday and having pain and nausea, to come in for GYN [place] tomorrow and see me after.

## 2017-04-23 ENCOUNTER — Encounter: Payer: Self-pay | Admitting: Adult Health

## 2017-04-23 ENCOUNTER — Ambulatory Visit: Payer: 59 | Admitting: Adult Health

## 2017-04-23 ENCOUNTER — Other Ambulatory Visit: Payer: Self-pay

## 2017-04-23 ENCOUNTER — Ambulatory Visit (INDEPENDENT_AMBULATORY_CARE_PROVIDER_SITE_OTHER): Payer: 59

## 2017-04-23 VITALS — BP 124/78 | HR 88 | Ht 63.0 in | Wt 175.0 lb

## 2017-04-23 DIAGNOSIS — R102 Pelvic and perineal pain: Secondary | ICD-10-CM | POA: Diagnosis not present

## 2017-04-23 DIAGNOSIS — R319 Hematuria, unspecified: Secondary | ICD-10-CM | POA: Diagnosis not present

## 2017-04-23 DIAGNOSIS — R110 Nausea: Secondary | ICD-10-CM | POA: Diagnosis not present

## 2017-04-23 DIAGNOSIS — D252 Subserosal leiomyoma of uterus: Secondary | ICD-10-CM | POA: Diagnosis not present

## 2017-04-23 DIAGNOSIS — N390 Urinary tract infection, site not specified: Secondary | ICD-10-CM

## 2017-04-23 LAB — POCT URINALYSIS DIPSTICK
Leukocytes, UA: NEGATIVE
Nitrite, UA: POSITIVE
RBC UA: POSITIVE

## 2017-04-23 MED ORDER — SULFAMETHOXAZOLE-TRIMETHOPRIM 800-160 MG PO TABS: 1.0000 | ORAL_TABLET | Freq: Two times a day (BID) | ORAL | 0 refills | Status: DC

## 2017-04-23 MED ORDER — PROMETHAZINE HCL 25 MG PO TABS: 25.0000 mg | ORAL_TABLET | Freq: Four times a day (QID) | ORAL | 1 refills | Status: DC | PRN

## 2017-04-23 MED ORDER — PHENAZOPYRIDINE HCL 95 MG PO TABS: 95.0000 mg | ORAL_TABLET | Freq: Three times a day (TID) | ORAL | 0 refills | Status: DC | PRN

## 2017-04-23 NOTE — Progress Notes (Signed)
Subjective:     Patient ID: Hannah Osborn, female   DOB: [birth year], 32 y.o.   MRN: [id number]  HPI Hannah Osborn is a 32 year old black female, called yesterday with pelvic pain and nausea since starting period is on OCs, and is in for [place] and now says hurts when voids esp in am.  Review of Systems +pain with voiding, esp in am +nausea +pelvic pain for 3 days BMs normal for her, kinda constipated Denies any fever Reviewed past medical,surgical, social and family history. Reviewed medications and allergies.     Objective:   Physical Exam BP 124/78 (BP Location: Right Arm, Patient Position: Sitting, Cuff Size: Normal)   Pulse 88   Ht 5\' 3"  (1.6 m)   Wt 175 lb (79.4 kg)   LMP 04/22/2017 (Exact Date)   BMI 31.00 kg/m urine dipstick 1+protein, +blood and +nitrates, skin warm and dry, Abdomen is soft, no HSM, +tenderness over bladder.US showed normal ovaries, EEC 5.2 mm and posterior fibroid 4.4 cm x 3.6 cm x 5.1 cm.    Assessment:     1. Urinary tract infection with hematuria, site unspecified   2. Pelvic pain       Plan:     Meds ordered this encounter  Medications  . sulfamethoxazole-trimethoprim (BACTRIM DS,SEPTRA DS) 800-160 MG tablet    Sig: Take 1 tablet by mouth 2 (two) times daily. Take 1 bid    Dispense:  14 tablet    Refill:  0    Order Specific Question:   Supervising Provider    Answer:   Elonda Husky, LUTHER H [[id number]]  . promethazine (PHENERGAN) 25 MG tablet    Sig: Take 1 tablet (25 mg total) by mouth every 6 (six) hours as needed for nausea or vomiting.    Dispense:  30 tablet    Refill:  1    Order Specific Question:   Supervising Provider    Answer:   Elonda Husky, LUTHER H [[id number]]  . phenazopyridine (AZO URINARY PAIN RELIEF) 95 MG tablet    Sig: Take 1 tablet (95 mg total) by mouth 3 (three) times daily as needed for pain.    Dispense:  10 tablet    Refill:  0    Order Specific Question:   Supervising Provider    Answer:   Tania Ade H [[id number]]  F/U in 2 days  UA C&S sent,  will also get GC/CHL on urine If any fever or vomiting and diarrhea go to ER

## 2017-04-23 NOTE — Progress Notes (Signed)
PELVIC US TA/TV: enlarged heterogeneous anteverted uterus with a posterior subserosal fibroid 5.1 x 4.4 x 3.6 cm,EEC 5.2 cm,normal ovaries bilat,ovaries appear mobile,no free fluid,pelvic pain during ultrasound

## 2017-04-24 LAB — URINALYSIS, ROUTINE W REFLEX MICROSCOPIC
BILIRUBIN UA: NEGATIVE
Glucose, UA: NEGATIVE
KETONES UA: NEGATIVE
Leukocytes, UA: NEGATIVE
NITRITE UA: NEGATIVE
SPEC GRAV UA: 1.023 (ref 1.005–1.030)
UUROB: 0.2 mg/dL (ref 0.2–1.0)
pH, UA: 5.5 (ref 5.0–7.5)

## 2017-04-24 LAB — MICROSCOPIC EXAMINATION
Bacteria, UA: NONE SEEN
Casts: NONE SEEN /lpf

## 2017-04-25 ENCOUNTER — Encounter: Payer: Self-pay | Admitting: Adult Health

## 2017-04-25 ENCOUNTER — Ambulatory Visit (INDEPENDENT_AMBULATORY_CARE_PROVIDER_SITE_OTHER): Payer: 59 | Admitting: Adult Health

## 2017-04-25 VITALS — BP 132/82 | HR 108 | Ht 63.0 in | Wt 177.5 lb

## 2017-04-25 DIAGNOSIS — R102 Pelvic and perineal pain: Secondary | ICD-10-CM | POA: Diagnosis not present

## 2017-04-25 DIAGNOSIS — R110 Nausea: Secondary | ICD-10-CM

## 2017-04-25 LAB — URINE CULTURE

## 2017-04-25 LAB — GC/CHLAMYDIA PROBE AMP
CHLAMYDIA, DNA PROBE: NEGATIVE
NEISSERIA GONORRHOEAE BY PCR: NEGATIVE

## 2017-04-25 NOTE — Progress Notes (Signed)
Subjective:     Patient ID: Hannah Osborn, female   DOB: [birth year], 32 y.o.   MRN: [id number]  HPI [name] is a 32 year old black female in for Follow up on pelvic pain that started with her period. And she feels better, still sore, occasional nausea and dizzy.  Review of Systems Just sore now, pelvic pain resolving Occasional nausea and dizzy Had BM yesterday, had pressure on bladder then  Reviewed past medical,surgical, social and family history. Reviewed medications and allergies.     Objective:   Physical Exam BP 132/82 (BP Location: Left Arm, Patient Position: Sitting, Cuff Size: Normal)   Pulse (!) 108   Ht 5\' 3"  (1.6 m)   Wt 177 lb 8 oz (80.5 kg)   LMP 04/22/2017 (Exact Date)   BMI 31.44 kg/m skin warm and dry, abdomen is soft, some tenderness over bladder and LLQ bowel, has good bowel sounds all 4 quadrants, and no tenderness upper quadrants. G/CHL both negative , and urine culture <10,000 colonies.    Assessment:     Pelvic pain, resolving     Plan:     Finish antibiotic Push fluids F/U prn

## 2017-04-28 ENCOUNTER — Ambulatory Visit: Payer: 59 | Admitting: Adult Health

## 2017-06-27 ENCOUNTER — Encounter: Payer: Self-pay | Admitting: Family Medicine

## 2017-07-02 ENCOUNTER — Ambulatory Visit: Payer: 59 | Admitting: Adult Health

## 2017-10-21 ENCOUNTER — Emergency Department (HOSPITAL_COMMUNITY)
Admission: EM | Admit: 2017-10-21 | Discharge: 2017-10-21 | Disposition: A | Discharge status: Home / Self Care | Payer: PRIVATE HEALTH INSURANCE | Attending: Emergency Medicine | Admitting: Emergency Medicine

## 2017-10-21 ENCOUNTER — Other Ambulatory Visit: Payer: Self-pay

## 2017-10-21 ENCOUNTER — Emergency Department (HOSPITAL_COMMUNITY): Payer: PRIVATE HEALTH INSURANCE

## 2017-10-21 ENCOUNTER — Encounter (HOSPITAL_COMMUNITY): Payer: Self-pay | Admitting: Emergency Medicine

## 2017-10-21 DIAGNOSIS — Y9389 Activity, other specified: Secondary | ICD-10-CM | POA: Diagnosis not present

## 2017-10-21 DIAGNOSIS — Y9259 Other trade areas as the place of occurrence of the external cause: Secondary | ICD-10-CM | POA: Diagnosis not present

## 2017-10-21 DIAGNOSIS — E039 Hypothyroidism, unspecified: Secondary | ICD-10-CM | POA: Diagnosis not present

## 2017-10-21 DIAGNOSIS — S29012A Strain of muscle and tendon of back wall of thorax, initial encounter: Secondary | ICD-10-CM | POA: Diagnosis not present

## 2017-10-21 DIAGNOSIS — R52 Pain, unspecified: Secondary | ICD-10-CM

## 2017-10-21 DIAGNOSIS — X503XXA Overexertion from repetitive movements, initial encounter: Secondary | ICD-10-CM | POA: Insufficient documentation

## 2017-10-21 DIAGNOSIS — S299XXA Unspecified injury of thorax, initial encounter: Secondary | ICD-10-CM | POA: Diagnosis present

## 2017-10-21 DIAGNOSIS — Y998 Other external cause status: Secondary | ICD-10-CM | POA: Diagnosis not present

## 2017-10-21 LAB — URINALYSIS, ROUTINE W REFLEX MICROSCOPIC
BACTERIA UA: NONE SEEN
BILIRUBIN URINE: NEGATIVE
GLUCOSE, UA: NEGATIVE mg/dL
KETONES UR: NEGATIVE mg/dL
Leukocytes, UA: NEGATIVE
Nitrite: NEGATIVE
PROTEIN: NEGATIVE mg/dL
Specific Gravity, Urine: 1.015 (ref 1.005–1.030)
pH: 5 (ref 5.0–8.0)

## 2017-10-21 LAB — POC URINE PREG, ED: PREG TEST UR: NEGATIVE

## 2017-10-21 MED ORDER — IBUPROFEN 600 MG PO TABS: 600.0000 mg | ORAL_TABLET | Freq: Four times a day (QID) | ORAL | 0 refills | Status: DC | PRN

## 2017-10-21 MED ORDER — METHOCARBAMOL 750 MG PO TABS: 750.0000 mg | ORAL_TABLET | Freq: Four times a day (QID) | ORAL | 0 refills | Status: DC

## 2017-10-21 NOTE — ED Notes (Signed)
PA at bedside.

## 2017-10-21 NOTE — ED Provider Notes (Signed)
[facility] EMERGENCY DEPARTMENT Provider Note   CSN: [id number] Arrival date & time: 10/21/17  3976     History   Chief Complaint Chief Complaint  Patient presents with  . Back Pain    HPI Hannah Osborn is a 32 y.o. female presenting with left mid back pain which started gradually at work yesterday.  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  The pain is described as a cramping muscle like pain and radiates to her left lateral rib cage area.  She denies shortness of breath, chest pain, cough.  Pain is worsened with certain movements and with palpation.  She has taken ibuprofen 400 mg this morning with no significant improvement.  She also endorses increased urinary frequency which is been present for the past several days.  She denies dysuria or hematuria.  Also denies abdominal pain, nausea or vomiting.  She did have a UTI several months ago but denies similar symptoms aside from frequency and urgency.  The history is provided by the patient.    Past Medical History:  Diagnosis Date  . Allergy   . Anemia   . Anxiety   . Fibroid 01/08/2016  . Hypothyroid 04/03/2017  . Hypothyroidism   . Thyroid disease     Patient Active Problem List   Diagnosis Date Noted  . Hypothyroid 04/03/2017  . Internal and external bleeding hemorrhoids   . Diarrhea, functional 03/06/2016  . Rectal bleeding 03/06/2016  . Fibroid 01/08/2016  . Thyroid disorder 12/18/2015  . Interstitial cystitis 12/18/2015  . Anemia 12/18/2015  . Functional ovarian cysts 12/18/2015  . Menorrhagia 12/18/2015  . Class 1 obesity due to excess calories with body mass index (BMI) of 34.0 to 34.9 in adult 12/18/2015  . Environmental allergies 12/18/2015    Past Surgical History:  Procedure Laterality Date  . CHOLECYSTECTOMY    .  DILITATION & CURRETTAGE/HYSTROSCOPY WITH NOVASURE ABLATION N/A 02/02/2016   Procedure: DILATATION & CURETTAGE/HYSTEROSCOPY WITH NOVASURE ABLATION (procedure 2);  Surgeon: Florian Buff, MD;  Location: [facility];  Service: Gynecology;  Laterality: N/A;  . HEMORRHOID SURGERY N/A 05/01/2016   Procedure: EXTENSIVE HEMORRHOIDECTOMY;  Surgeon: Aviva Signs, MD;  Location: [facility];  Service: General;  Laterality: N/A;  . LAPAROSCOPIC BILATERAL SALPINGECTOMY Bilateral 02/02/2016   Procedure: LAPAROSCOPIC BILATERAL SALPINGECTOMY (procedure #1);  Surgeon: Florian Buff, MD;  Location: [facility];  Service: Gynecology;  Laterality: Bilateral;  . TONSILLECTOMY    . TUBAL LIGATION       OB History    Gravida  1   Para  1   Term  1   Preterm      AB      Living  1     SAB      TAB      Ectopic      Multiple      Live Births  1            Home Medications    Prior to Admission medications   Medication Sig Start Date End Date Taking? Authorizing Provider  cyclobenzaprine (FLEXERIL) 5 MG tablet Take 5 mg by mouth as needed.  04/21/17   [provider]  ibuprofen (ADVIL,MOTRIN) 600 MG tablet Take 1 tablet (600 mg total) by mouth every 6 (six) hours as needed. 10/21/17   Evalee Jefferson, PA-C  levothyroxine (SYNTHROID, LEVOTHROID) 50 MCG tablet Take 1 tablet (50 mcg total) by mouth daily before breakfast. 04/03/17   Estill Dooms, NP  methocarbamol (ROBAXIN-750) 750 MG tablet Take 1 tablet (750 mg total) by mouth 4 (four) times daily. 10/21/17   Evalee Jefferson, PA-C  Norethindrone-Ethinyl Estradiol-Fe Biphas (LO LOESTRIN FE) 1 MG-10 MCG / 10 MCG tablet Take 1 tablet by mouth daily. Take 1 daily by mouth 04/01/17   Estill Dooms, NP  phenazopyridine (AZO URINARY PAIN RELIEF) 95 MG tablet Take 1 tablet (95 mg total) by mouth 3 (three) times daily as needed for pain. 04/23/17   Estill Dooms, NP  promethazine (PHENERGAN) 25 MG tablet Take 1 tablet (25 mg total) by mouth every 6 (six)  hours as needed for nausea or vomiting. 04/23/17   Estill Dooms, NP  sulfamethoxazole-trimethoprim (BACTRIM DS,SEPTRA DS) 800-160 MG tablet Take 1 tablet by mouth 2 (two) times daily. Take 1 bid 04/23/17   Estill Dooms, NP    Family History Family History  Problem Relation Age of Onset  . Pulmonary embolism Mother   . Early death Mother   . Cancer Father        stomach  . Early death Father   . Asthma Son   . Allergies Son   . Heart disease Maternal Uncle   . Cancer Maternal Grandmother        uterine  . Cancer Maternal Grandfather        colon    Social History Social History   Tobacco Use  . Smoking status: Never Smoker  . Smokeless tobacco: Never Used  Substance Use Topics  . Alcohol use: No  . Drug use: No     Allergies   Patient has no known allergies.   Review of Systems Review of Systems  Constitutional: Negative for chills and fever.  HENT: Negative for congestion and sore throat.   Eyes: Negative.   Respiratory: Negative for chest tightness and shortness of breath.   Cardiovascular: Negative for chest pain.  Gastrointestinal: Negative for abdominal pain, nausea and vomiting.  Genitourinary: Positive for frequency and urgency. Negative for dysuria and pelvic pain.  Musculoskeletal: Positive for back pain. Negative for arthralgias, joint swelling and neck pain.  Skin: Negative.  Negative for rash and wound.  Neurological: Negative for dizziness, weakness, light-headedness, numbness and headaches.  Psychiatric/Behavioral: Negative.      Physical Exam Updated Vital Signs BP (!) 131/97 (BP Location: Left Arm)   Pulse 87   Temp 98.7 F (37.1 C) (Oral)   Resp 16   Ht 5\' 3"  (1.6 m)   Wt 87.1 kg   LMP 09/27/2017   SpO2 100%   BMI 34.01 kg/m   Physical Exam  Constitutional: She appears well-developed and well-nourished.  HENT:  Head: Normocephalic and atraumatic.  Eyes: Conjunctivae are normal.  Neck: Normal range of motion.    Cardiovascular: Normal rate, regular rhythm, normal heart sounds and intact distal pulses.  Pulmonary/Chest: Effort normal and breath sounds normal. She has no wheezes.  Abdominal: Soft. Bowel sounds are normal. There is no tenderness. There is no rebound, no guarding and no CVA tenderness.  Musculoskeletal: Normal range of motion.       Back:  Patient is tender to palpation from her mid thoracic vertebrae across her inferior left scapula.  There is no palpable spasm or deformity.  Neurological: She is alert.  Skin: Skin is warm and dry.  Psychiatric: She has a normal mood  and affect.  Nursing note and vitals reviewed.    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 EXAM: THORACIC SPINE - 3 VIEWS COMPARISON:  Coronal and sagittal reconstructed images through the thoracic spine from a chest CT scan dated February 10, 2012 FINDINGS: The thoracic vertebral bodies are preserved in height. The pedicles appear to be appropriately positioned. There are no abnormal paravertebral soft tissue densities. The disc space heights are reasonably well-maintained. IMPRESSION: There is no acute or significant chronic bony abnormality of the thoracic spine. Electronically Signed   By: David  Martinique M.D.   On: 10/21/2017 10:20    Procedures Procedures (including critical care time)  Medications Ordered in ED Medications - No data to display   Initial Impression / Assessment and Plan / ED Course  I have reviewed the triage vital signs and  the nursing notes.  Pertinent labs & imaging results that were available during my care of the patient were reviewed by me and considered in my medical decision making (see chart for details).     Reproducible left thoracic pain, suspect muscle spasm/strain.  UA is clear.  There is hemoglobin but no RBCs in her urine.  No CVA tenderness, no symptoms suggesting kidney stone.  She was placed on ibuprofen and Robaxin, discussed heat therapy.  PRN follow-up if symptoms persist or worsen.  Final Clinical Impressions(s) / ED Diagnoses   Final diagnoses:  Strain of thoracic back region    ED Discharge Orders         Ordered    ibuprofen (ADVIL,MOTRIN) 600 MG tablet  Every 6 hours PRN     10/21/17 1027    methocarbamol (ROBAXIN-750) 750 MG tablet  4 times daily     10/21/17 1027           Evalee Jefferson, PA-C 10/21/17 1033    Long, Wonda [facility], MD 10/21/17 [phone]

## 2017-10-21 NOTE — Discharge Instructions (Addendum)
Use the medicines prescribed for your back pain.  Also a heating pad applied to the site 20 minutes 3 times daily may help to relax this suspected muscle spasm.  Your xray and urinalysis are normal today.

## 2017-10-21 NOTE — ED Triage Notes (Signed)
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  Has continued to get worse.

## 2018-07-22 ENCOUNTER — Encounter: Payer: Self-pay | Admitting: Adult Health

## 2018-07-22 ENCOUNTER — Ambulatory Visit (INDEPENDENT_AMBULATORY_CARE_PROVIDER_SITE_OTHER): Payer: 59 | Admitting: Adult Health

## 2018-07-22 ENCOUNTER — Other Ambulatory Visit: Payer: Self-pay

## 2018-07-22 VITALS — BP 135/87 | HR 99 | Ht 63.0 in | Wt 196.5 lb

## 2018-07-22 DIAGNOSIS — E039 Hypothyroidism, unspecified: Secondary | ICD-10-CM | POA: Diagnosis not present

## 2018-07-22 DIAGNOSIS — Z01419 Encounter for gynecological examination (general) (routine) without abnormal findings: Secondary | ICD-10-CM

## 2018-07-22 DIAGNOSIS — Z113 Encounter for screening for infections with a predominantly sexual mode of transmission: Secondary | ICD-10-CM

## 2018-07-22 MED ORDER — IBUPROFEN 800 MG PO TABS: 800.0000 mg | ORAL_TABLET | Freq: Three times a day (TID) | ORAL | 3 refills | Status: DC | PRN

## 2018-07-22 NOTE — Progress Notes (Signed)
Patient ID: Hannah Osborn, female   DOB: 06-19-1985, 33 y.o.   MRN: [id number] History of Present Illness: Hannah Osborn is a 33 year old black female, G1P1, sp tubal and ablation, in for well woman gyn exam,she had a normal pap with negative HPV 04/01/17. She is having more frequent headaches, but has stopped her synthroid and has 3 days periods and legs ache before the period and motrin helps.   Current Medications, Allergies, Past Medical History, Past Surgical History, Family History and Social History were reviewed in [employer] record.     Review of Systems: Patient denies any  hearing loss, fatigue, blurred vision, shortness of breath, chest pain, abdominal pain, problems with bowel movements, urination, or intercourse. No joint pain or mood swings. See HPI for positives.   Physical Exam:BP 135/87 (BP Location: Right Arm, Patient Position: Sitting, Cuff Size: Normal)   Pulse 99   Ht 5\' 3"  (1.6 m)   Wt 196 lb 8 oz (89.1 kg)   LMP 07/11/2018   BMI 34.81 kg/m  General:  Well developed, well nourished, no acute distress Skin:  Warm and dry Neck:  Midline trachea, normal thyroid, good ROM, no lymphadenopathy Lungs; Clear to auscultation bilaterally Breast:  No dominant palpable mass, retraction, or nipple discharge Cardiovascular: Regular rate and rhythm Abdomen:  Soft, non tender, no hepatosplenomegaly Pelvic:  External genitalia is normal in appearance, no lesions.  The vagina is normal in appearance. Urethra has no lesions or masses. The cervix is bulbous.  Uterus is felt to be normal size, shape, and contour.  No adnexal masses or tenderness noted.Bladder is non tender, no masses felt. Nuswab obtained. Extremities/musculoskeletal:  No swelling or varicosities noted, no clubbing or cyanosis Psych:  No mood changes, alert and cooperative,seems happy Fall risk is low. PHQ 2 score is 1. Examination chaperoned by Levy Pupa LPN.  Impression: 1. Encounter for  well woman exam with routine gynecological exam   2. Screening examination for STD (sexually transmitted disease)   3. Hypothyroidism, unspecified type       Plan: Nuswab sent Check CBC,CMP,TSH and HIV and RPR Will talk when results back Physical in 1 year Pap in 2022 Will refill motrin 800 mg Meds ordered this encounter  Medications  . ibuprofen (ADVIL) 800 MG tablet    Sig: Take 1 tablet (800 mg total) by mouth every 8 (eight) hours as needed.    Dispense:  60 tablet    Refill:  3    Order Specific Question:   Supervising Provider    Answer:   Tania Ade H [[id number]]

## 2018-07-24 LAB — CBC
Hematocrit: 35.9 % (ref 34.0–46.6)
Hemoglobin: 11.7 g/dL (ref 11.1–15.9)
MCH: 25.1 pg — ABNORMAL LOW (ref 26.6–33.0)
MCHC: 32.6 g/dL (ref 31.5–35.7)
MCV: 77 fL — ABNORMAL LOW (ref 79–97)
Platelets: 287 10*3/uL (ref 150–450)
RBC: 4.66 x10E6/uL (ref 3.77–5.28)
RDW: 15.9 % — ABNORMAL HIGH (ref 11.7–15.4)
WBC: 6.1 10*3/uL (ref 3.4–10.8)

## 2018-07-24 LAB — COMPREHENSIVE METABOLIC PANEL
ALT: 14 IU/L (ref 0–32)
AST: 14 IU/L (ref 0–40)
Albumin/Globulin Ratio: 1.8 (ref 1.2–2.2)
Albumin: 4.3 g/dL (ref 3.8–4.8)
Alkaline Phosphatase: 63 IU/L (ref 39–117)
BUN/Creatinine Ratio: 19 (ref 9–23)
BUN: 13 mg/dL (ref 6–20)
Bilirubin Total: <0.2 mg/dL (ref 0.0–1.2)
CO2: 26 mmol/L (ref 20–29)
Calcium: 9.6 mg/dL (ref 8.7–10.2)
Chloride: 105 mmol/L (ref 96–106)
Creatinine, Ser: 0.7 mg/dL (ref 0.57–1.00)
GFR calc Af Amer: 133 mL/min/{1.73_m2} (ref >59)
GFR calc non Af Amer: 115 mL/min/{1.73_m2} (ref >59)
Globulin, Total: 2.4 g/dL (ref 1.5–4.5)
Glucose: 81 mg/dL (ref 65–99)
Potassium: 4.3 mmol/L (ref 3.5–5.2)
Sodium: 146 mmol/L — ABNORMAL HIGH (ref 134–144)
Total Protein: 6.7 g/dL (ref 6.0–8.5)

## 2018-07-24 LAB — RPR: RPR Ser Ql: NONREACTIVE

## 2018-07-24 LAB — TSH: TSH: 3.44 u[IU]/mL (ref 0.450–4.500)

## 2018-07-24 LAB — HIV ANTIBODY (ROUTINE TESTING W REFLEX): HIV Screen 4th Generation wRfx: NONREACTIVE

## 2018-07-25 LAB — NUSWAB VAGINITIS PLUS (VG+)
Candida albicans, NAA: NEGATIVE
Candida glabrata, NAA: NEGATIVE
Chlamydia trachomatis, NAA: NEGATIVE
Neisseria gonorrhoeae, NAA: NEGATIVE
Trich vag by NAA: NEGATIVE

## 2018-08-25 DIAGNOSIS — Z20828 Contact with and (suspected) exposure to other viral communicable diseases: Secondary | ICD-10-CM | POA: Diagnosis not present

## 2018-09-02 ENCOUNTER — Telehealth: Payer: Self-pay | Admitting: Obstetrics & Gynecology

## 2018-09-02 NOTE — Telephone Encounter (Signed)
Patient called, thinks she has a bladder infection.  Please advise.  Patient is requesting medication for it.  [employer]  [phone]

## 2018-09-03 ENCOUNTER — Ambulatory Visit: Payer: 59 | Admitting: Obstetrics and Gynecology

## 2018-09-03 ENCOUNTER — Encounter: Payer: Self-pay | Admitting: *Deleted

## 2018-09-03 ENCOUNTER — Other Ambulatory Visit (INDEPENDENT_AMBULATORY_CARE_PROVIDER_SITE_OTHER): Payer: 59

## 2018-09-03 ENCOUNTER — Other Ambulatory Visit: Payer: Self-pay

## 2018-09-03 ENCOUNTER — Other Ambulatory Visit: Payer: Self-pay | Admitting: Obstetrics and Gynecology

## 2018-09-03 VITALS — Temp 98.5°F | Ht 63.0 in | Wt 196.0 lb

## 2018-09-03 DIAGNOSIS — R350 Frequency of micturition: Secondary | ICD-10-CM

## 2018-09-03 LAB — POCT URINALYSIS DIPSTICK OB
Glucose, UA: NEGATIVE
Ketones, UA: NEGATIVE
Leukocytes, UA: NEGATIVE
Nitrite, UA: NEGATIVE
POC,PROTEIN,UA: NEGATIVE

## 2018-09-03 MED ORDER — SULFAMETHOXAZOLE-TRIMETHOPRIM 800-160 MG PO TABS: 1.0000 | ORAL_TABLET | Freq: Two times a day (BID) | ORAL | 0 refills | Status: DC

## 2018-09-03 NOTE — Progress Notes (Signed)
Pt here for urine check. Having frequency and pain. Took AZO.urine + blood.urine culture sent.Pad CMA

## 2018-09-03 NOTE — Progress Notes (Signed)
uti sx, culture sent. Unable to do u/a

## 2018-09-04 NOTE — Telephone Encounter (Signed)
Called pt, lmoam to call to be added to the lab schedule for a urine sample

## 2018-09-05 LAB — URINE CULTURE

## 2018-09-10 ENCOUNTER — Other Ambulatory Visit: Payer: Self-pay

## 2018-09-10 DIAGNOSIS — R6889 Other general symptoms and signs: Secondary | ICD-10-CM | POA: Diagnosis not present

## 2018-09-10 DIAGNOSIS — Z20822 Contact with and (suspected) exposure to covid-19: Secondary | ICD-10-CM

## 2018-09-12 LAB — NOVEL CORONAVIRUS, NAA: SARS-CoV-2, NAA: NOT DETECTED

## 2019-02-19 ENCOUNTER — Telehealth: Payer: 59 | Admitting: Nurse Practitioner

## 2019-02-19 DIAGNOSIS — Z20822 Contact with and (suspected) exposure to covid-19: Secondary | ICD-10-CM

## 2019-02-19 MED ORDER — BENZONATATE 100 MG PO CAPS: 100.0000 mg | ORAL_CAPSULE | Freq: Three times a day (TID) | ORAL | 0 refills | Status: DC | PRN

## 2019-02-19 MED ORDER — ALBUTEROL SULFATE HFA 108 (90 BASE) MCG/ACT IN AERS: 2.0000 | INHALATION_SPRAY | Freq: Four times a day (QID) | RESPIRATORY_TRACT | 0 refills | Status: DC | PRN

## 2019-02-19 NOTE — Progress Notes (Signed)
E-Visit for Corona Virus Screening  Your current symptoms could be consistent with the coronavirus.  Many health care providers can now test patients at their office but not all are.  [facility] has multiple testing sites. For information on our COVID testing locations and hours go to [email or web address]  We are enrolling you in our MyChart Home Monitoring for COVID19 . Daily you will receive a questionnaire within the MyChart website. Our COVID 19 response team will be monitoring your responses daily.  Testing Information: The COVID-19 Community Testing sites will begin testing BY APPOINTMENT ONLY.  You can schedule online at [email or web address]  If you do not have access to a smart phone or computer you may call [phone] for an appointment.   Additional testing sites in the Community:  . For [facility] Testing sites in [place]  https://www.cvs.com/minuteclinic/covid-19-testing  . For Pop-up testing sites in [place]  https://covid19.ncdhhs.gov/about-covid-19/testing/find-my-testing-place/pop-testing-sites  . For Testing sites with regular hours https://onsms.org/[place]/  . For [place] [facility] https://tapmedicine.com/covid-19-community-outreach-testing/  . For [facility] https://www.[place].gov[place]/human-services/health-department/coronavirus-covid-19-info/covid-19-testing  . For [place] testing in [place] https://www.[place].gov/[place]/human-services/health-department/coronavirus-covid-19-info/covid-19-testing  . For [facility] testing in [place]   https://lhi.care/covidtesting  For  more information about community testing call [phone]   Please quarantine yourself while awaiting your test results. Please stay home for a minimum of 10 days from the first day of illness with improving symptoms and you have had 24 hours of no fever (without the use of Tylenol (Acetaminophen)  Motrin (Ibuprofen) or any fever reducing medication).  Also - Do not get tested prior to returning to work because once you have had a positive test the test can stay positive for more then a month in some cases.   You should wear a mask or cloth face covering over your nose and mouth if you must be around other people or animals, including pets (even at home). Try to stay at least 6 feet away from other people. This will protect the people around you.  Please continue good preventive care measures, including:  frequent hand-washing, avoid touching your face, cover coughs/sneezes, stay out of crowds and keep a 6 foot distance from others.  COVID-19 is a respiratory illness with symptoms that are similar to the flu. Symptoms are typically mild to moderate, but there have been cases of severe illness and death due to the virus.   The following symptoms may appear 2-14 days after exposure: . Fever . Cough . Shortness of breath or difficulty breathing . Chills . Repeated shaking with chills . Muscle pain . Headache . Sore throat . New loss of taste or smell . Fatigue . Congestion or runny nose . Nausea or vomiting . Diarrhea  Go to the nearest hospital ED for assessment if fever/cough/breathlessness are severe or illness seems like a threat to life.  It is vitally important that if you feel that you have an infection such as this virus or any other virus that you stay home and away from places where you may spread it to others.  You should avoid contact with people age 65 and older.   You can use medication such as A prescription cough medication called Tessalon Perles 100 mg. You may take 1-2 capsules every 8 hours as needed for cough and A prescription inhaler called Albuterol MDI 90 mcg /actuation 2 puffs every 4 hours as needed for shortness of breath, wheezing, cough  You may also take acetaminophen (Tylenol) as needed for fever.  Reduce   your risk of any infection by using the same  precautions used for avoiding the common cold or flu:  Marland Kitchen Wash your hands often with soap and warm water for at least 20 seconds.  If soap and water are not readily available, use an alcohol-based hand sanitizer with at least 60% alcohol.  . If coughing or sneezing, cover your mouth and nose by coughing or sneezing into the elbow areas of your shirt or coat, into a tissue or into your sleeve (not your hands). . Avoid shaking hands with others and consider head nods or verbal greetings only. . Avoid touching your eyes, nose, or mouth with unwashed hands.  . Avoid close contact with people who are sick. . Avoid places or events with large numbers of people in one location, like concerts or sporting events. . Carefully consider travel plans you have or are making. . If you are planning any travel outside or inside the [place], visit the CDC's Travelers' Health webpage for the latest health notices. . If you have some symptoms but not all symptoms, continue to monitor at home and seek medical attention if your symptoms worsen. . If you are having a medical emergency, call 911.  HOME CARE . Only take medications as instructed by your medical team. . Drink plenty of fluids and get plenty of rest. . A steam or ultrasonic humidifier can help if you have congestion.   GET HELP RIGHT AWAY IF YOU HAVE EMERGENCY WARNING SIGNS** FOR COVID-19. If you or someone is showing any of these signs seek emergency medical care immediately. Call 911 or proceed to your closest emergency facility if: . You develop worsening high fever. . Trouble breathing . Bluish lips or face . Persistent pain or pressure in the chest . New confusion . Inability to wake or stay awake . You cough up blood. . Your symptoms become more severe  **This list is not all possible symptoms. Contact your medical provider for any symptoms that are sever or concerning to you.  MAKE SURE YOU   Understand these instructions.  Will watch your  condition.  Will get help right away if you are not doing well or get worse.  Your e-visit answers were reviewed by a board certified advanced clinical practitioner to complete your personal care plan.  Depending on the condition, your plan could have included both over the counter or prescription medications.  If there is a problem please reply once you have received a response from your provider.  Your safety is important to [place].  If you have drug allergies check your prescription carefully.    You can use MyChart to ask questions about today's visit, request a non-urgent call back, or ask for a work or school excuse for 24 hours related to this e-Visit. If it has been greater than 24 hours you will need to follow up with your provider, or enter a new e-Visit to address those concerns. You will get an e-mail in the next two days asking about your experience.  I hope that your e-visit has been valuable and will speed your recovery. Thank you for using e-visits.  Particia Nearing PA-C  Approximately 5 minutes was spent documenting and reviewing patient's chart.

## 2019-02-24 DIAGNOSIS — R509 Fever, unspecified: Secondary | ICD-10-CM | POA: Diagnosis not present

## 2019-02-24 DIAGNOSIS — R072 Precordial pain: Secondary | ICD-10-CM | POA: Diagnosis not present

## 2019-02-24 DIAGNOSIS — U071 COVID-19: Secondary | ICD-10-CM | POA: Diagnosis not present

## 2019-02-24 DIAGNOSIS — R7989 Other specified abnormal findings of blood chemistry: Secondary | ICD-10-CM | POA: Diagnosis not present

## 2019-02-24 DIAGNOSIS — R42 Dizziness and giddiness: Secondary | ICD-10-CM | POA: Diagnosis not present

## 2019-02-24 DIAGNOSIS — R0602 Shortness of breath: Secondary | ICD-10-CM | POA: Diagnosis not present

## 2019-02-24 DIAGNOSIS — R0600 Dyspnea, unspecified: Secondary | ICD-10-CM | POA: Diagnosis not present

## 2019-02-24 DIAGNOSIS — J189 Pneumonia, unspecified organism: Secondary | ICD-10-CM | POA: Diagnosis not present

## 2019-03-05 ENCOUNTER — Telehealth: Payer: 59 | Admitting: Emergency Medicine

## 2019-03-05 DIAGNOSIS — L0390 Cellulitis, unspecified: Secondary | ICD-10-CM

## 2019-03-05 MED ORDER — DOXYCYCLINE HYCLATE 100 MG PO CAPS: 100.0000 mg | ORAL_CAPSULE | Freq: Two times a day (BID) | ORAL | 0 refills | Status: DC

## 2019-03-05 NOTE — Progress Notes (Signed)
E Visit for Cellulitis  We are sorry that you are not feeling well. Here is how we plan to help!  Based on what you shared with me it looks like you have cellulitis.  Cellulitis looks like areas of skin redness, swelling, and warmth; it develops as a result of bacteria entering under the skin. Little red spots and/or bleeding can be seen in skin, and tiny surface sacs containing fluid can occur. Fever can be present. Cellulitis is almost always on one side of a body, and the lower limbs are the most common site of involvement.   I have prescribed: Doxycycline 100mg .  Take morning and evening for 10 days.  Do NOT take if pregnant or breast feeding.  HOME CARE:  . Take your medications as ordered and take all of them, even if the skin irritation appears to be healing.   GET HELP RIGHT AWAY IF:  . Symptoms that don't begin to go away within 48 hours. . Severe redness persists or worsens . If the area turns color, spreads or swells. . If it blisters and opens, develops yellow-brown crust or bleeds. . You develop a fever or chills. . If the pain increases or becomes unbearable.  . Are unable to keep fluids and food down.  MAKE SURE YOU    Understand these instructions.  Will watch your condition.  Will get help right away if you are not doing well or get worse.  Thank you for choosing an e-visit. Your e-visit answers were reviewed by a board certified advanced clinical practitioner to complete your personal care plan. Depending upon the condition, your plan could have included both over the counter or prescription medications. Please review your pharmacy choice. Make sure the pharmacy is open so you can pick up prescription now. If there is a problem, you may contact your provider through [employer] and have the prescription routed to another pharmacy. Your safety is important to [place]. If you have drug allergies check your prescription carefully.  For the next 24 hours you can use  MyChart to ask questions about today's visit, request a non-urgent call back, or ask for a work or school excuse. You will get an email in the next two days asking about your experience. I hope that your e-visit has been valuable and will speed your recovery.  Approximately 5 minutes was used in reviewing the patient's chart, questionnaire, prescribing medications, and documentation.

## 2019-03-08 ENCOUNTER — Telehealth: Payer: 59 | Admitting: Physician Assistant

## 2019-03-08 DIAGNOSIS — L0390 Cellulitis, unspecified: Secondary | ICD-10-CM

## 2019-03-08 NOTE — Progress Notes (Signed)
E Visit for Cellulitis  The area looks to be healing well. Continue to take all antibiotics to completion. Take your antibiotics with food.  Common side effects of antibiotics include nausea, vomiting, abdominal discomfort, and diarrhea. You may help offset some of this with probiotics which you can buy or get in yogurt. Do not eat  or take the probiotics until 2 hours after your antibiotic.    Some studies suggest that certain antibiotics can reduce the efficacy of certain oral contraceptive pills (birth control), so please use additional contraceptives (condoms or other barrier method) while you are taking the antibiotics and for an additional 5 to 7 days afterwards if you are a female on these medications.  Continue to monitor the area. If the area becomes very painful or the redness spreads or you develop streaking of the rash up the arm or fevers, please seek immediate evaluation.   You can take 1 to 2 tablets of Tylenol (350mg -1000mg  depending on the dose) every 6 hours as needed for pain.  Do not exceed 4000 mg of Tylenol daily.  If your pain persists you can take a doses of ibuprofen in between doses of Tylenol.  I usually recommend 400 to 600 mg of ibuprofen every 6 hours.  Take this with food to avoid upset stomach issues.   I have reattached the information you received at your last e-visit below to review:    Based on what you shared with me it looks like you have cellulitis.  Cellulitis looks like areas of skin redness, swelling, and warmth; it develops as a result of bacteria entering under the skin. Little red spots and/or bleeding can be seen in skin, and tiny surface sacs containing fluid can occur. Fever can be present. Cellulitis is almost always on one side of a body, and the lower limbs are the most common site of involvement.   HOME CARE:  . Take your medications as ordered and take all of them, even if the skin irritation appears to be healing.   GET HELP RIGHT AWAY  IF:  . Symptoms that don't begin to go away within 48 hours. . Severe redness persists or worsens . If the area turns color, spreads or swells. . If it blisters and opens, develops yellow-brown crust or bleeds. . You develop a fever or chills. . If the pain increases or becomes unbearable.  . Are unable to keep fluids and food down.  MAKE SURE YOU    Understand these instructions.  Will watch your condition.  Will get help right away if you are not doing well or get worse.  Thank you for choosing an e-visit. Your e-visit answers were reviewed by a board certified advanced clinical practitioner to complete your personal care plan. Depending upon the condition, your plan could have included both over the counter or prescription medications. Please review your pharmacy choice. Make sure the pharmacy is open so you can pick up prescription now. If there is a problem, you may contact your provider through [employer] and have the prescription routed to another pharmacy. Your safety is important to [place]. If you have drug allergies check your prescription carefully.  For the next 24 hours you can use MyChart to ask questions about today's visit, request a non-urgent call back, or ask for a work or school excuse. You will get an email in the next two days asking about your experience. I hope that your e-visit has been valuable and will speed your recovery.

## 2019-03-25 ENCOUNTER — Ambulatory Visit (INDEPENDENT_AMBULATORY_CARE_PROVIDER_SITE_OTHER): Payer: 59

## 2019-03-25 ENCOUNTER — Other Ambulatory Visit: Payer: Self-pay

## 2019-03-25 ENCOUNTER — Ambulatory Visit
Admission: EM | Admit: 2019-03-25 | Discharge: 2019-03-25 | Disposition: A | Discharge status: Home / Self Care | Payer: 59 | Attending: Emergency Medicine | Admitting: Emergency Medicine

## 2019-03-25 DIAGNOSIS — J189 Pneumonia, unspecified organism: Secondary | ICD-10-CM | POA: Diagnosis not present

## 2019-03-25 DIAGNOSIS — Z09 Encounter for follow-up examination after completed treatment for conditions other than malignant neoplasm: Secondary | ICD-10-CM

## 2019-03-25 NOTE — Discharge Instructions (Addendum)
Chest x-ray is negative for cardiopulmonary disease Advised patient to establish care with a primary care provider Resource was provided

## 2019-03-25 NOTE — ED Provider Notes (Signed)
[facility]    CSN: [id number] Arrival date & time: 03/25/19  1147      History   Chief Complaint Chief Complaint  Patient presents with  . f/u chest xray    HPI Hannah Osborn is a 34 y.o. female.   Who presented to the urgent care for a follow-up chest x-ray after diagnostic of pneumonia at [facility].  Patient stated she was prescribed doxycycline antibiotic for treatment.  Has completed antibiotic course with symptom relief.  She was told to have a follow-up x-ray 1 month after.  Denies chills, fever, shortness of breath, cough, nausea, vomiting, diarrhea, chest pain, chest tightness.     Past Medical History:  Diagnosis Date  . Allergy   . Anemia   . Anxiety   . Fibroid 01/08/2016  . Hypothyroid 04/03/2017  . Hypothyroidism   . Thyroid disease     Patient Active Problem List   Diagnosis Date Noted  . Screening examination for STD (sexually transmitted disease) 07/22/2018  . Encounter for well woman exam with routine gynecological exam 07/22/2018  . Hypothyroidism 07/22/2018  . Hypothyroid 04/03/2017  . Internal and external bleeding hemorrhoids   . Diarrhea, functional 03/06/2016  . Rectal bleeding 03/06/2016  . Fibroid 01/08/2016  . Thyroid disorder 12/18/2015  . Interstitial cystitis 12/18/2015  . Anemia 12/18/2015  . Functional ovarian cysts 12/18/2015  . Menorrhagia 12/18/2015  . Class 1 obesity due to excess calories with body mass index (BMI) of 34.0 to 34.9 in adult 12/18/2015  . Environmental allergies 12/18/2015    Past Surgical History:  Procedure Laterality Date  . CHOLECYSTECTOMY    . DILITATION & CURRETTAGE/HYSTROSCOPY WITH NOVASURE ABLATION N/A 02/02/2016   Procedure: DILATATION & CURETTAGE/HYSTEROSCOPY WITH NOVASURE ABLATION (procedure 2);  Surgeon: Florian Buff, MD;  Location: [facility];  Service: Gynecology;  Laterality: N/A;  . HEMORRHOID SURGERY N/A 05/01/2016   Procedure: EXTENSIVE HEMORRHOIDECTOMY;  Surgeon: Aviva Signs,  MD;  Location: [facility];  Service: General;  Laterality: N/A;  . LAPAROSCOPIC BILATERAL SALPINGECTOMY Bilateral 02/02/2016   Procedure: LAPAROSCOPIC BILATERAL SALPINGECTOMY (procedure #1);  Surgeon: Florian Buff, MD;  Location: [facility];  Service: Gynecology;  Laterality: Bilateral;  . TONSILLECTOMY    . TUBAL LIGATION      OB History    Gravida  1   Para  1   Term  1   Preterm      AB      Living  1     SAB      TAB      Ectopic      Multiple      Live Births  1            Home Medications    Prior to Admission medications   Medication Sig Start Date End Date Taking? Authorizing Provider  albuterol (VENTOLIN HFA) 108 (90 Base) MCG/ACT inhaler Inhale 2 puffs into the lungs every 6 (six) hours as needed for wheezing or shortness of breath. 02/19/19   Terald Sleeper, PA-C  benzonatate (TESSALON) 100 MG capsule Take 1-2 capsules (100-200 mg total) by mouth 3 (three) times daily as needed for cough. 02/19/19   Terald Sleeper, PA-C  cyclobenzaprine (FLEXERIL) 5 MG tablet Take 5 mg by mouth as needed.  04/21/17   [provider]  doxycycline (VIBRAMYCIN) 100 MG capsule Take 1 capsule (100 mg total) by mouth 2 (two) times daily. 03/05/19   Montine Circle, PA-C  ibuprofen (ADVIL) 800 MG  tablet Take 1 tablet (800 mg total) by mouth every 8 (eight) hours as needed. Patient not taking: Reported on 09/03/2018 07/22/18   Estill Dooms, NP  promethazine (PHENERGAN) 25 MG tablet Take 1 tablet (25 mg total) by mouth every 6 (six) hours as needed for nausea or vomiting. Patient not taking: Reported on 09/03/2018 04/23/17   Derrek Monaco A, NP  sulfamethoxazole-trimethoprim (BACTRIM DS) 800-160 MG tablet Take 1 tablet by mouth 2 (two) times daily. 09/03/18   Jonnie Kind, MD    Family History Family History  Problem Relation Age of Onset  . Pulmonary embolism Mother   . Early death Mother   . Cancer Father        stomach  . Early death Father   . Asthma Son   .  Allergies Son   . Heart disease Maternal Uncle   . Cancer Maternal Grandmother        uterine  . Cancer Maternal Grandfather        colon    Social History Social History   Tobacco Use  . Smoking status: Never Smoker  . Smokeless tobacco: Never Used  Substance Use Topics  . Alcohol use: No  . Drug use: No     Allergies   Patient has no known allergies.   Review of Systems Review of Systems  Constitutional: Negative.   HENT: Negative.   Respiratory: Negative.   Cardiovascular: Negative.   Gastrointestinal: Negative.   Neurological: Negative.   All other systems reviewed and are negative.    Physical Exam Triage Vital Signs ED Triage Vitals  Enc Vitals Group     BP      Pulse      Resp      Temp      Temp src      SpO2      Weight      Height      Head Circumference      Peak Flow      Pain Score      Pain Loc      Pain Edu?      Excl. in [place]?    No data found.  Updated Vital Signs LMP 03/22/2019   Visual Acuity Right Eye Distance:   Left Eye Distance:   Bilateral Distance:    Right Eye Near:   Left Eye Near:    Bilateral Near:     Physical Exam Vitals and nursing note reviewed.  Constitutional:      General: She is not in acute distress.    Appearance: Normal appearance. She is normal weight. She is not ill-appearing or toxic-appearing.  HENT:     Head: Normocephalic.     Right Ear: Tympanic membrane, ear canal and external ear normal. There is no impacted cerumen.     Left Ear: Tympanic membrane, ear canal and external ear normal. There is no impacted cerumen.     Nose: Nose normal. No congestion.     Mouth/Throat:     Mouth: Mucous membranes are moist.     Pharynx: Oropharynx is clear. No oropharyngeal exudate or posterior oropharyngeal erythema.  Cardiovascular:     Rate and Rhythm: Normal rate and regular rhythm.     Pulses: Normal pulses.     Heart sounds: Normal heart sounds. No murmur.  Pulmonary:     Effort: Pulmonary effort  is normal. No respiratory distress.     Breath sounds: Normal breath sounds. No wheezing or rhonchi.  Chest:     Chest wall: No tenderness.  Neurological:     Mental Status: She is alert and oriented to person, place, and time.      UC Treatments / Results  Labs (all labs ordered are listed, but only abnormal results are displayed) Labs Reviewed - No data to display  EKG   Radiology DG Chest 2 View  Result Date: 03/25/2019 CLINICAL DATA:  Pneumonia. EXAM: CHEST - 2 VIEW COMPARISON:  February 24, 2019. FINDINGS: The heart size and mediastinal contours are within normal limits. Both lungs are clear. The visualized skeletal structures are unremarkable. IMPRESSION: No active cardiopulmonary disease. Electronically Signed   By: Marijo Conception M.D.   On: 03/25/2019 12:30    Procedures Procedures (including critical care time)  Medications Ordered in UC Medications - No data to display  Initial Impression / Assessment and Plan / UC Course  I have reviewed the triage vital signs and the nursing notes.  Pertinent labs & imaging results that were available during my care of the patient were reviewed by me and considered in my medical decision making (see chart for details).     Patient is stable for discharge. Follow-up x-rays is negative for acute pulmonary disease.  I have reviewed the x-ray myself and the radiologist interpretation.  I am in agreement with the radiologist interpretation.  Advised patient to follow-up with primary care  Final Clinical Impressions(s) / UC Diagnoses   Final diagnoses:  Follow-up exam after treatment     Discharge Instructions     Chest x-ray is negative for cardiopulmonary disease Advised patient to establish care with a primary care provider Resource was provided    ED Prescriptions    None     PDMP not reviewed this encounter.   Emerson Monte, FNP 03/25/19 1252

## 2019-03-25 NOTE — ED Triage Notes (Addendum)
Pt presents to UC for f/u chest xray Pt states she does not have a PCP. She was dx at [place] w/ pneumonia via xray on 02/24/19. Pt states she called this UC and this UC stated she could come back in a month to have a f/u xray. Pt denies any symptoms at this time.

## 2019-03-29 ENCOUNTER — Other Ambulatory Visit: Payer: Self-pay

## 2019-03-29 ENCOUNTER — Telehealth: Payer: 59 | Admitting: Emergency Medicine

## 2019-03-29 DIAGNOSIS — N3000 Acute cystitis without hematuria: Secondary | ICD-10-CM | POA: Diagnosis not present

## 2019-03-29 MED ORDER — CEPHALEXIN 500 MG PO CAPS: 500.0000 mg | ORAL_CAPSULE | Freq: Two times a day (BID) | ORAL | 0 refills | Status: DC

## 2019-03-29 NOTE — Progress Notes (Signed)
We are sorry that you are not feeling well.  Here is how we plan to help!  Last urine culture reviewed showed pansensitive e coli >100,000 colonies 2018 Based on what you shared with me it looks like you most likely have a simple urinary tract infection.  A UTI (Urinary Tract Infection) is a bacterial infection of the bladder.  Most cases of urinary tract infections are simple to treat but a key part of your care is to encourage you to drink plenty of fluids and watch your symptoms carefully.  I have prescribed Keflex 500 mg twice a day for 7 days.  Your symptoms should gradually improve. Call us if the burning in your urine worsens, you develop worsening fever, back pain or pelvic pain or if your symptoms do not resolve after completing the antibiotic.  Urinary tract infections can be prevented by drinking plenty of water to keep your body hydrated.  Also be sure when you wipe, wipe from front to back and don't hold it in!  If possible, empty your bladder every 4 hours.  Your e-visit answers were reviewed by a board certified advanced clinical practitioner to complete your personal care plan.  Depending on the condition, your plan could have included both over the counter or prescription medications.  If there is a problem please reply  once you have received a response from your provider.  Your safety is important to [place].  If you have drug allergies check your prescription carefully.    You can use MyChart to ask questions about today's visit, request a non-urgent call back, or ask for a work or school excuse for 24 hours related to this e-Visit. If it has been greater than 24 hours you will need to follow up with your provider, or enter a new e-Visit to address those concerns.   You will get an e-mail in the next two days asking about your experience.  I hope that your e-visit has been valuable and will speed your recovery. Thank you for using e-visits.  Greater than 5 but less than 10  minutes spent researching, coordinating, and implementing care for this patient today

## 2019-04-02 DIAGNOSIS — N921 Excessive and frequent menstruation with irregular cycle: Secondary | ICD-10-CM | POA: Diagnosis not present

## 2019-04-02 DIAGNOSIS — Z8616 Personal history of COVID-19: Secondary | ICD-10-CM | POA: Diagnosis not present

## 2019-04-02 DIAGNOSIS — E6609 Other obesity due to excess calories: Secondary | ICD-10-CM | POA: Diagnosis not present

## 2019-04-02 DIAGNOSIS — M130 Polyarthritis, unspecified: Secondary | ICD-10-CM | POA: Diagnosis not present

## 2019-04-15 ENCOUNTER — Telehealth: Payer: 59 | Admitting: Emergency Medicine

## 2019-04-15 ENCOUNTER — Emergency Department (HOSPITAL_COMMUNITY): Payer: 59

## 2019-04-15 ENCOUNTER — Emergency Department (HOSPITAL_COMMUNITY)
Admission: EM | Admit: 2019-04-15 | Discharge: 2019-04-15 | Disposition: A | Discharge status: Home / Self Care | Payer: 59 | Attending: Emergency Medicine | Admitting: Emergency Medicine

## 2019-04-15 ENCOUNTER — Encounter (HOSPITAL_COMMUNITY): Payer: Self-pay | Admitting: *Deleted

## 2019-04-15 ENCOUNTER — Other Ambulatory Visit: Payer: Self-pay

## 2019-04-15 DIAGNOSIS — R0602 Shortness of breath: Secondary | ICD-10-CM | POA: Insufficient documentation

## 2019-04-15 DIAGNOSIS — Z79899 Other long term (current) drug therapy: Secondary | ICD-10-CM | POA: Insufficient documentation

## 2019-04-15 DIAGNOSIS — Z8616 Personal history of COVID-19: Secondary | ICD-10-CM | POA: Insufficient documentation

## 2019-04-15 DIAGNOSIS — R0789 Other chest pain: Secondary | ICD-10-CM

## 2019-04-15 DIAGNOSIS — R079 Chest pain, unspecified: Secondary | ICD-10-CM | POA: Diagnosis not present

## 2019-04-15 DIAGNOSIS — E039 Hypothyroidism, unspecified: Secondary | ICD-10-CM | POA: Diagnosis not present

## 2019-04-15 HISTORY — DX: COVID-19: U07.1

## 2019-04-15 LAB — BASIC METABOLIC PANEL
Anion gap: 7 (ref 5–15)
BUN: 13 mg/dL (ref 6–20)
CO2: 27 mmol/L (ref 22–32)
Calcium: 9.4 mg/dL (ref 8.9–10.3)
Chloride: 105 mmol/L (ref 98–111)
Creatinine, Ser: 0.66 mg/dL (ref 0.44–1.00)
GFR calc Af Amer: >60 mL/min (ref >60)
GFR calc non Af Amer: >60 mL/min (ref >60)
Glucose, Bld: 90 mg/dL (ref 70–99)
Potassium: 3.5 mmol/L (ref 3.5–5.1)
Sodium: 139 mmol/L (ref 135–145)

## 2019-04-15 LAB — TROPONIN I (HIGH SENSITIVITY)
Troponin I (High Sensitivity): <2 ng/L (ref <18)
Troponin I (High Sensitivity): <2 ng/L (ref <18)

## 2019-04-15 LAB — CBC
HCT: 36.4 % (ref 36.0–46.0)
Hemoglobin: 11.7 g/dL — ABNORMAL LOW (ref 12.0–15.0)
MCH: 25.8 pg — ABNORMAL LOW (ref 26.0–34.0)
MCHC: 32.1 g/dL (ref 30.0–36.0)
MCV: 80.2 fL (ref 80.0–100.0)
Platelets: 319 10*3/uL (ref 150–400)
RBC: 4.54 MIL/uL (ref 3.87–5.11)
RDW: 16.3 % — ABNORMAL HIGH (ref 11.5–15.5)
WBC: 7 10*3/uL (ref 4.0–10.5)
nRBC: 0 % (ref 0.0–0.2)

## 2019-04-15 MED ORDER — SODIUM CHLORIDE 0.9% FLUSH: 3.0000 mL | Freq: Once | INTRAVENOUS | Status: DC

## 2019-04-15 NOTE — ED Triage Notes (Addendum)
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  Used her inhaler which helped.  No distress noted in triage. Pt returned to work on 03/31/19 since having covid.

## 2019-04-15 NOTE — Progress Notes (Signed)
Based on what you shared with me, I feel your condition warrants further evaluation and I recommend that you be seen for a face to face office visit.   NOTE: If you entered your credit card information for this eVisit, you will not be charged. You may see a "hold" on your card for the $35 but that hold will drop off and you will not have a charge processed.   If you are having a true medical emergency please call 911.      For an urgent face to face visit, [facility] has five urgent care centers for your convenience:      NEW:  [facility] [place] [facility] [place] Get Driving Directions [id number] [place], [place] [address] . 10 am - 6pm Monday - Friday    [place] [facility] [place] [facility]) Get Driving Directions [id number] [address] [place], [place] [address] . 10 am to 8 pm Monday-Friday . 12 pm to 8 pm [facility] Get Driving Directions [id number] [place], [place], [place] [address] . 8 am to 8 pm Monday-Friday . 9 am to 6 pm Saturday . 11 am to 6 pm Sunday     [facility] Get Driving Directions  [id number] [address], [place] [address] . 8 am to 8 pm Monday-Friday . 8 am to 4 pm [facility] at [place] Get Driving Directions [id number] [place], [place] [address] . 12 pm to 6 pm Monday-Friday      Your e-visit answers were reviewed by a board certified advanced clinical practitioner to complete your personal care plan.  Thank you for using e-Visits.   Approximately 5 minutes was used in reviewing the patient's chart, questionnaire, prescribing medications, and documentation.

## 2019-04-15 NOTE — ED Provider Notes (Signed)
[facility] EMERGENCY DEPARTMENT Provider Note   CSN: [id number] Arrival date & time: 04/15/19  1450     History Chief Complaint  Patient presents with  . Chest Pain    Hannah Osborn is a 34 y.o. female.  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 Patient reports that the room was hot. She used her inhaler, with shortness of breath improving/resolved. She was mildly tachycardic upon arrival in ED. Her chest discomfort in along the lower rib margins bilaterally. This pain has been intermittent since her prior COVID infection.  The history is provided by the patient.  Chest Pain Pain location:  L chest and R chest Pain quality: tightness   Pain radiates to:  Does not radiate Pain severity:  Mild Timing:  Sporadic Progression:  Waxing and waning Associated symptoms: shortness of breath   Associated symptoms: no abdominal pain, no cough and no nausea   Shortness of breath:    Severity:  Moderate   Duration:  1 hour   Progression:  Resolved      Past Medical History:  Diagnosis Date  . Allergy   . Anemia   . Anxiety   . COVID-19   . Fibroid 01/08/2016  . Hypothyroid 04/03/2017  . Hypothyroidism   . Thyroid disease     Patient Active Problem List   Diagnosis Date Noted  . Screening examination for STD (sexually transmitted disease) 07/22/2018  . Encounter for well woman exam with routine gynecological exam 07/22/2018  . Hypothyroidism 07/22/2018  . Hypothyroid 04/03/2017  . Internal and external bleeding hemorrhoids   . Diarrhea, functional 03/06/2016  . Rectal bleeding 03/06/2016  . Fibroid 01/08/2016  . Thyroid disorder 12/18/2015  . Interstitial cystitis 12/18/2015  . Anemia 12/18/2015  . Functional ovarian cysts 12/18/2015  . Menorrhagia 12/18/2015  . Class 1 obesity due to excess calories with body mass index (BMI) of 34.0 to 34.9 in adult  12/18/2015  . Environmental allergies 12/18/2015    Past Surgical History:  Procedure Laterality Date  . CHOLECYSTECTOMY    . DILITATION & CURRETTAGE/HYSTROSCOPY WITH NOVASURE ABLATION N/A 02/02/2016   Procedure: DILATATION & CURETTAGE/HYSTEROSCOPY WITH NOVASURE ABLATION (procedure 2);  Surgeon: Florian Buff, MD;  Location: [facility];  Service: Gynecology;  Laterality: N/A;  . HEMORRHOID SURGERY N/A 05/01/2016   Procedure: EXTENSIVE HEMORRHOIDECTOMY;  Surgeon: Aviva Signs, MD;  Location: [facility];  Service: General;  Laterality: N/A;  . LAPAROSCOPIC BILATERAL SALPINGECTOMY Bilateral 02/02/2016   Procedure: LAPAROSCOPIC BILATERAL SALPINGECTOMY (procedure #1);  Surgeon: Florian Buff, MD;  Location: [facility];  Service: Gynecology;  Laterality: Bilateral;  . TONSILLECTOMY    . TUBAL LIGATION       OB History    Gravida  1   Para  1   Term  1   Preterm      AB      Living  1     SAB      TAB      Ectopic      Multiple      Live Births  1           Family History  Problem Relation Age of Onset  . Pulmonary embolism Mother   . Early death Mother   . Cancer Father        stomach  . Early death Father   . Asthma Son   .  Allergies Son   . Heart disease Maternal Uncle   . Cancer Maternal Grandmother        uterine  . Cancer Maternal Grandfather        colon    Social History   Tobacco Use  . Smoking status: Never Smoker  . Smokeless tobacco: Never Used  Substance Use Topics  . Alcohol use: No  . Drug use: No    Home Medications Prior to Admission medications   Medication Sig Start Date End Date Taking? Authorizing Provider  albuterol (VENTOLIN HFA) 108 (90 Base) MCG/ACT inhaler Inhale 2 puffs into the lungs every 6 (six) hours as needed for wheezing or shortness of breath. 02/19/19   Terald Sleeper, PA-C  benzonatate (TESSALON) 100 MG capsule Take 1-2 capsules (100-200 mg total) by mouth 3 (three) times daily as needed for cough. 02/19/19   Terald Sleeper, PA-C   cephALEXin (KEFLEX) 500 MG capsule Take 1 capsule (500 mg total) by mouth 2 (two) times daily. 2 caps po bid x 7 days 03/29/19   Margarita Mail, PA-C  cyclobenzaprine (FLEXERIL) 5 MG tablet Take 5 mg by mouth as needed.  04/21/17   [provider]  doxycycline (VIBRAMYCIN) 100 MG capsule Take 1 capsule (100 mg total) by mouth 2 (two) times daily. 03/05/19   Montine Circle, PA-C  ibuprofen (ADVIL) 800 MG tablet Take 1 tablet (800 mg total) by mouth every 8 (eight) hours as needed. Patient not taking: Reported on 09/03/2018 07/22/18   Estill Dooms, NP  promethazine (PHENERGAN) 25 MG tablet Take 1 tablet (25 mg total) by mouth every 6 (six) hours as needed for nausea or vomiting. Patient not taking: Reported on 09/03/2018 04/23/17   Derrek Monaco A, NP  sulfamethoxazole-trimethoprim (BACTRIM DS) 800-160 MG tablet Take 1 tablet by mouth 2 (two) times daily. 09/03/18   Jonnie Kind, MD    Allergies    Patient has no known allergies.  Review of Systems   Review of Systems  Respiratory: Positive for shortness of breath. Negative for cough.   Cardiovascular: Positive for chest pain.  Gastrointestinal: Negative for abdominal pain and nausea.  All other systems reviewed and are negative.   Physical Exam Updated Vital Signs BP (!) 148/104 (BP Location: Right Arm)   Pulse (!) 118   Temp 98.7 F (37.1 C) (Oral)   Resp 20   Ht 5\' 3"  (1.6 m)   LMP 03/22/2019   SpO2 100%   BMI 34.72 kg/m   Physical Exam Vitals and nursing note reviewed.  Constitutional:      General: She is not in acute distress.    Appearance: She is well-developed.  HENT:     Head: Normocephalic.     Mouth/Throat:     Mouth: Mucous membranes are moist.  Eyes:     Conjunctiva/sclera: Conjunctivae normal.  Cardiovascular:     Rate and Rhythm: Normal rate and regular rhythm.  Pulmonary:     Effort: Pulmonary effort is normal.     Breath sounds: Normal breath sounds.  Abdominal:     Palpations:  Abdomen is soft.  Skin:    General: Skin is warm and dry.  Neurological:     Mental Status: She is alert and oriented to person, place, and time.  Psychiatric:        Mood and Affect: Mood normal.        Behavior: Behavior normal.     ED Results / Procedures / Treatments   Labs (  all labs ordered are listed, but only abnormal results are displayed) Labs Reviewed  CBC - Abnormal; Notable for the following components:      Result Value   Hemoglobin 11.7 (*)    MCH 25.8 (*)    RDW 16.3 (*)    All other components within normal limits  BASIC METABOLIC PANEL  TROPONIN I (HIGH SENSITIVITY)  TROPONIN I (HIGH SENSITIVITY)    EKG None  Radiology DG Chest 2 View  Result Date: 04/15/2019 CLINICAL DATA:  Shortness of breath. Chest pain. COVID positive in January. EXAM: CHEST - 2 VIEW COMPARISON:  Radiograph 03/25/2019. Chest CT 02/24/2019 at [place]: The cardiomediastinal contours are normal. The lungs are clear. Pulmonary vasculature is normal. No consolidation, pleural effusion, or pneumothorax. No acute osseous abnormalities are seen. Gaseous gastric distention in the upper abdomen. IMPRESSION: No acute chest findings. Electronically Signed   By: Keith Rake M.D.   On: 04/15/2019 17:25    Procedures Procedures (including critical care time)  Medications Ordered in ED Medications  sodium chloride flush (NS) 0.9 % injection 3 mL (has no administration in time range)    ED Course  I have reviewed the triage vital signs and the nursing notes.  Pertinent labs & imaging results that were available during my care of the patient were reviewed by me and considered in my medical decision making (see chart for details).    MDM Rules/Calculators/A&P                      Patient with mild signs RAD. Patient used her inhaler PTA with resolution of symptoms. Oxygen saturation 99%. No accessory muscle use, no cyanosis.  Pt instructed to follow up with PCP. Patient is  hemodynamically stable. Discussed return precautions. Pt appears safe for discharge.   Final Clinical Impression(s) / ED Diagnoses Final diagnoses:  Chest wall pain  Shortness of breath    Rx / DC Orders ED Discharge Orders    None       Etta Quill, NP 04/15/19 2332    Davonna Belling, MD 04/21/19 1620

## 2019-06-23 DIAGNOSIS — K5900 Constipation, unspecified: Secondary | ICD-10-CM | POA: Diagnosis not present

## 2019-06-23 DIAGNOSIS — Z8616 Personal history of COVID-19: Secondary | ICD-10-CM | POA: Diagnosis not present

## 2019-06-23 DIAGNOSIS — M130 Polyarthritis, unspecified: Secondary | ICD-10-CM | POA: Diagnosis not present

## 2019-06-23 DIAGNOSIS — F419 Anxiety disorder, unspecified: Secondary | ICD-10-CM | POA: Diagnosis not present

## 2019-06-28 ENCOUNTER — Telehealth: Payer: 59 | Admitting: Emergency Medicine

## 2019-06-28 DIAGNOSIS — R300 Dysuria: Secondary | ICD-10-CM

## 2019-06-28 MED ORDER — CEPHALEXIN 500 MG PO CAPS: 500.0000 mg | ORAL_CAPSULE | Freq: Two times a day (BID) | ORAL | 0 refills | Status: DC

## 2019-06-28 NOTE — Progress Notes (Signed)

## 2019-07-06 DIAGNOSIS — F40248 Other situational type phobia: Secondary | ICD-10-CM | POA: Diagnosis not present

## 2020-03-15 DIAGNOSIS — M5441 Lumbago with sciatica, right side: Secondary | ICD-10-CM | POA: Diagnosis not present

## 2020-03-15 DIAGNOSIS — M62830 Muscle spasm of back: Secondary | ICD-10-CM | POA: Diagnosis not present

## 2020-03-15 DIAGNOSIS — N814 Uterovaginal prolapse, unspecified: Secondary | ICD-10-CM | POA: Diagnosis not present

## 2020-03-15 DIAGNOSIS — F4010 Social phobia, unspecified: Secondary | ICD-10-CM | POA: Diagnosis not present

## 2020-03-21 ENCOUNTER — Other Ambulatory Visit (HOSPITAL_COMMUNITY)
Admission: RE | Admit: 2020-03-21 | Discharge: 2020-03-21 | Disposition: A | Discharge status: Home / Self Care | Payer: BC Managed Care – PPO | Source: Ambulatory Visit | Attending: Adult Health | Admitting: Adult Health

## 2020-03-21 ENCOUNTER — Ambulatory Visit (INDEPENDENT_AMBULATORY_CARE_PROVIDER_SITE_OTHER): Payer: BC Managed Care – PPO | Admitting: Adult Health

## 2020-03-21 ENCOUNTER — Encounter: Payer: Self-pay | Admitting: Adult Health

## 2020-03-21 ENCOUNTER — Other Ambulatory Visit: Payer: Self-pay

## 2020-03-21 VITALS — BP 131/85 | HR 89 | Ht 63.0 in | Wt 193.0 lb

## 2020-03-21 DIAGNOSIS — Z113 Encounter for screening for infections with a predominantly sexual mode of transmission: Secondary | ICD-10-CM | POA: Diagnosis not present

## 2020-03-21 DIAGNOSIS — K649 Unspecified hemorrhoids: Secondary | ICD-10-CM

## 2020-03-21 DIAGNOSIS — F419 Anxiety disorder, unspecified: Secondary | ICD-10-CM | POA: Diagnosis not present

## 2020-03-21 DIAGNOSIS — Z01419 Encounter for gynecological examination (general) (routine) without abnormal findings: Secondary | ICD-10-CM | POA: Insufficient documentation

## 2020-03-21 DIAGNOSIS — N946 Dysmenorrhea, unspecified: Secondary | ICD-10-CM

## 2020-03-21 DIAGNOSIS — N816 Rectocele: Secondary | ICD-10-CM

## 2020-03-21 DIAGNOSIS — F32A Depression, unspecified: Secondary | ICD-10-CM

## 2020-03-21 HISTORY — DX: Depression, unspecified: F32.A

## 2020-03-21 HISTORY — DX: Anxiety disorder, unspecified: F41.9

## 2020-03-21 MED ORDER — IBUPROFEN 800 MG PO TABS: 800.0000 mg | ORAL_TABLET | Freq: Three times a day (TID) | ORAL | 3 refills | Status: DC | PRN

## 2020-03-21 MED ORDER — PREPARATION H 0.25-88.44 % RE SUPP: 1.0000 | RECTAL | 0 refills | Status: DC | PRN

## 2020-03-21 MED ORDER — SERTRALINE HCL 50 MG PO TABS: 50.0000 mg | ORAL_TABLET | Freq: Every day | ORAL | 6 refills | Status: DC

## 2020-03-21 NOTE — Progress Notes (Signed)
Patient ID: Hannah Osborn, female   DOB: [birth year], 35 y.o.   MRN: [id number] History of Present Illness: Hannah Osborn is a 35 year old black female, single, G1P1 in for a well woman gyn exam and pap, and she requests STD testing. 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  PCP is Dr Karie Kirks.   Current Medications, Allergies, Past Medical History, Past Surgical History, Family History and Social History were reviewed in [employer] record.     Review of Systems:  Patient denies any headaches, hearing loss, fatigue, blurred vision, shortness of breath, chest pain, abdominal pain, problems with urination, or intercourse. No joint pain or mood swings. She has bulge with BMs, and hemorrhoids She had an ablation but still has like a 3 days period and has period cramps and body aches with period.   Physical Exam:BP 131/85 (BP Location: Left Arm, Patient Position: Sitting, Cuff Size: Normal)    Pulse 89    Ht 5\' 3"  (1.6 m)    Wt 193 lb (87.5 kg)    LMP 03/21/2020    BMI 34.19 kg/m  General:  Well developed, well nourished, no acute distress Skin:  Warm and dry Neck:  Midline trachea, normal thyroid, good ROM, no lymphadenopathy Lungs; Clear to auscultation bilaterally Breast:  No dominant palpable mass, retraction, or nipple discharge,has bilateral nipple rods Cardiovascular: Regular rate and rhythm Abdomen:  Soft, non tender, no hepatosplenomegaly Pelvic:  External genitalia is normal in appearance, no lesions.  The vagina is normal in appearance. Urethra has no lesions or masses. The cervix is smooth, pap with GC/CHL and HR HPV genotyping performed.   Uterus is felt to be normal size, shape, and contour.  No adnexal masses or tenderness noted.Bladder is non tender, no masses felt. Rectal: sphincter tone is good, she is a bit tight, no polyps, +hemorrhoids felt. +mild low rectocele Extremities/musculoskeletal:  No swelling or varicosities noted, no clubbing or cyanosis Psych:   No mood changes, alert and cooperative,seems happy AA is 2 Fall risk is low PHQ 9 score is 16, no SI or HI but has had thoughts no plans, had been on meds and stopped, did not like how she felt, does see counselor GAD 7 score is 15  Upstream - 03/21/20 0900      Pregnancy Intention Screening   Does the patient want to become pregnant in the next year? No    Does the patient's partner want to become pregnant in the next year? No    Would the patient like to discuss contraceptive options today? No      Contraception Wrap Up   Current Method Female Sterilization    End Method Female Sterilization    Contraception Counseling Provided No          Examination chaperoned by Tish RN   Impression and Plan: 1. Encounter for gynecological examination with Papanicolaou smear of cervix Pap sent Physical in 1 year Pap in 3 years if normal  2. Anxiety and depression Will try zoloft 25 mg for 3-4 days then 50 mg daily Follow up with me in 8 weeks   Meds ordered this encounter  Medications   sertraline (ZOLOFT) 50 MG tablet    Sig: Take 1 tablet (50 mg total) by mouth daily.    Dispense:  30 tablet    Refill:  6    Order Specific Question:   Supervising Provider    Answer:   Tania Ade H [[id number]]   ibuprofen (ADVIL)  800 MG tablet    Sig: Take 1 tablet (800 mg total) by mouth every 8 (eight) hours as needed.    Dispense:  60 tablet    Refill:  3    Order Specific Question:   Supervising Provider    Answer:   Tania Ade H [[id number]]   shark liver oil-cocoa butter (PREPARATION H) 0.25-88.44 % suppository    Sig: Place 1 suppository rectally as needed for hemorrhoids.    Dispense:  12 suppository    Refill:  0    Order Specific Question:   Supervising Provider    Answer:   Elonda Husky, LUTHER H [[id number]]    3. Rectocele Try preparation H and increase fruit  4. Hemorrhoids, unspecified hemorrhoid type Try preparation H  5. Dysmenorrhea Will refill ibuprofen   6. Screening examination  for STD (sexually transmitted disease) Check HIV,RPR and Hepatitis C antibody

## 2020-03-22 LAB — HIV ANTIBODY (ROUTINE TESTING W REFLEX): HIV Screen 4th Generation wRfx: NONREACTIVE

## 2020-03-22 LAB — HEPATITIS C ANTIBODY: Hep C Virus Ab: <0.1 s/co ratio (ref 0.0–0.9)

## 2020-03-22 LAB — RPR: RPR Ser Ql: NONREACTIVE

## 2020-03-23 LAB — CYTOLOGY - PAP
Chlamydia: NEGATIVE
Comment: NEGATIVE
Comment: NEGATIVE
Comment: NORMAL
Diagnosis: NEGATIVE
High risk HPV: NEGATIVE
Neisseria Gonorrhea: NEGATIVE

## 2020-04-06 ENCOUNTER — Other Ambulatory Visit: Payer: Self-pay | Admitting: Adult Health

## 2020-04-06 MED ORDER — SULFAMETHOXAZOLE-TRIMETHOPRIM 800-160 MG PO TABS: 1.0000 | ORAL_TABLET | Freq: Two times a day (BID) | ORAL | 0 refills | Status: DC

## 2020-04-06 NOTE — Progress Notes (Signed)
Will rx septra ds  

## 2020-05-16 ENCOUNTER — Other Ambulatory Visit: Payer: Self-pay

## 2020-05-16 ENCOUNTER — Encounter: Payer: Self-pay | Admitting: Adult Health

## 2020-05-16 ENCOUNTER — Ambulatory Visit (INDEPENDENT_AMBULATORY_CARE_PROVIDER_SITE_OTHER): Payer: BC Managed Care – PPO | Admitting: Adult Health

## 2020-05-16 VITALS — BP 138/89 | HR 97 | Ht 63.0 in | Wt 192.0 lb

## 2020-05-16 DIAGNOSIS — F32A Depression, unspecified: Secondary | ICD-10-CM | POA: Diagnosis not present

## 2020-05-16 DIAGNOSIS — F419 Anxiety disorder, unspecified: Secondary | ICD-10-CM

## 2020-05-16 MED ORDER — SERTRALINE HCL 25 MG PO TABS: ORAL_TABLET | ORAL | 1 refills | Status: DC

## 2020-05-16 NOTE — Progress Notes (Signed)
  Subjective:     Patient ID: Hannah Osborn, female   DOB: 06-09-1985, 35 y.o.   MRN: [id number]  HPI Hannah Osborn is a 35 year old black female, single, G1P1 back in follow up after starting Zoloft for anxiety and depression and feels better. PCP is Dr Karie Kirks.   Review of Systems Feels less anxious, and not as depressed  Meds ordered this encounter  Medications  . sertraline (ZOLOFT) 25 MG tablet    Sig: Take 3 po daily    Dispense:  270 tablet    Refill:  1    Order Specific Question:   Supervising Provider    Answer:   Tania Ade H [[id number]]      Objective:   Physical Exam BP 138/89 (BP Location: Left Arm, Patient Position: Sitting, Cuff Size: Normal)   Pulse 97   Ht 5\' 3"  (1.6 m)   Wt 192 lb (87.1 kg)   LMP 05/16/2020 (Exact Date)   BMI 34.01 kg/m Skin warm and dry. Lungs: clear to ausculation bilaterally. Cardiovascular: regular rate and rhythm. [place] Visit from 05/16/2020 in [facility] Office Visit from 03/21/2020 in [facility]  Thoughts that you would be better off dead, or of hurting yourself in some way Not at all More than half the days  PHQ-9 Total Score 11 16     GAD 7 : Generalized Anxiety Score 05/16/2020 03/21/2020  Nervous, Anxious, on Edge 1 2  Control/stop worrying 2 2  Worry too much - different things 1 2  Trouble relaxing 1 2  Restless 0 3  Easily annoyed or irritable 2 2  Afraid - awful might happen 0 2  Total GAD 7 Score 7 15    Upstream - 05/16/20 1413      Pregnancy Intention Screening   Does the patient want to become pregnant in the next year? No    Does the patient's partner want to become pregnant in the next year? No    Would the patient like to discuss contraceptive options today? No      Contraception Wrap Up   Current Method Female Sterilization    End Method Female Sterilization    Contraception Counseling Provided No             Assessment:     1. Anxiety and depression Will increase Zoloft to  75 mg daily Meds ordered this encounter  Medications  . sertraline (ZOLOFT) 25 MG tablet    Sig: Take 3 po daily    Dispense:  270 tablet    Refill:  1    Order Specific Question:   Supervising Provider    Answer:   Florian Buff [[id number]]      Plan:     Follow up in 3 months or sooner if needed

## 2020-06-13 DIAGNOSIS — U071 COVID-19: Secondary | ICD-10-CM | POA: Diagnosis not present

## 2020-06-21 IMAGING — DX DG CHEST 2V
2 series · 2 of 2 positions shown · non-contrast
Comparison: February 24, 2019.

CLINICAL DATA: Pneumonia.

EXAM:
CHEST - 2 VIEW

[chest pa]
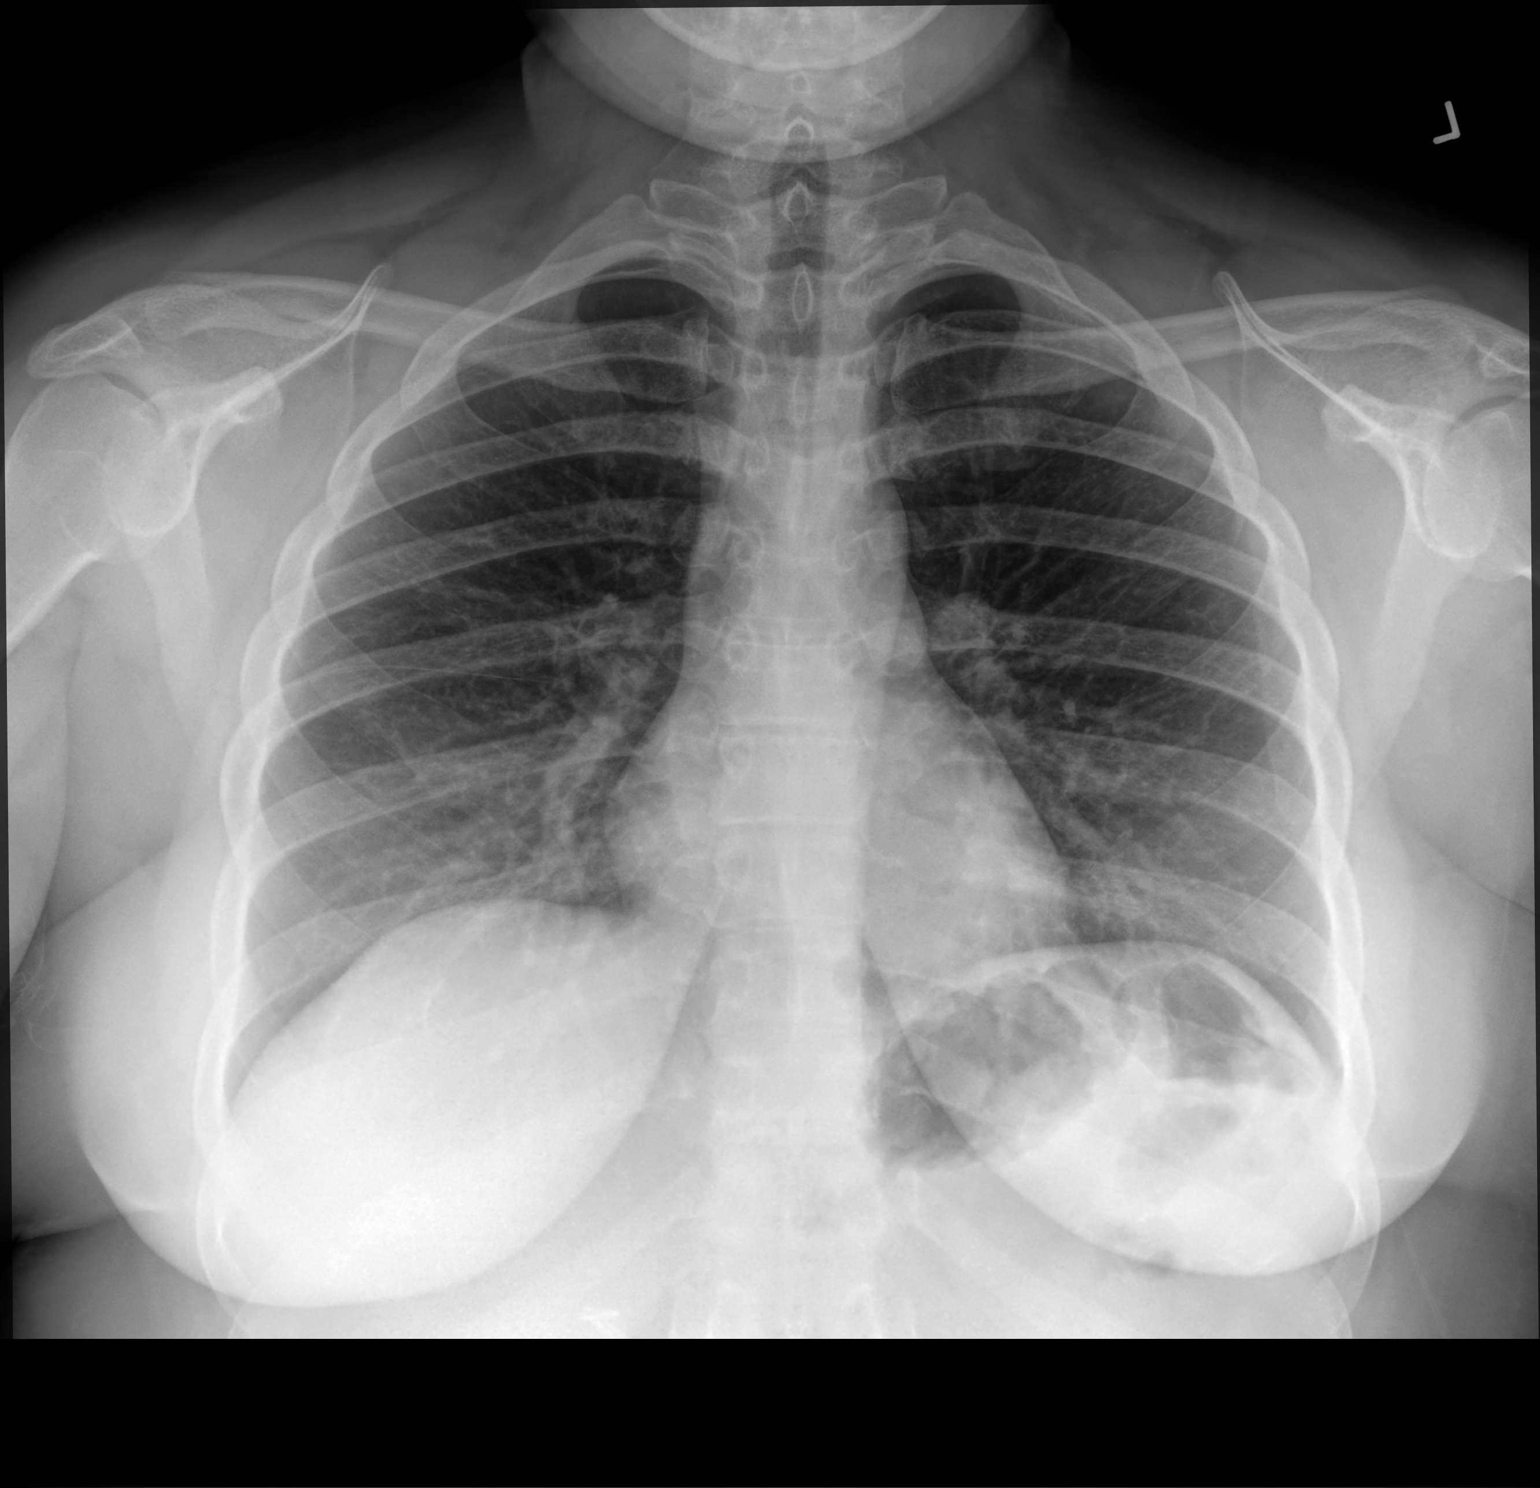

[chest lat]
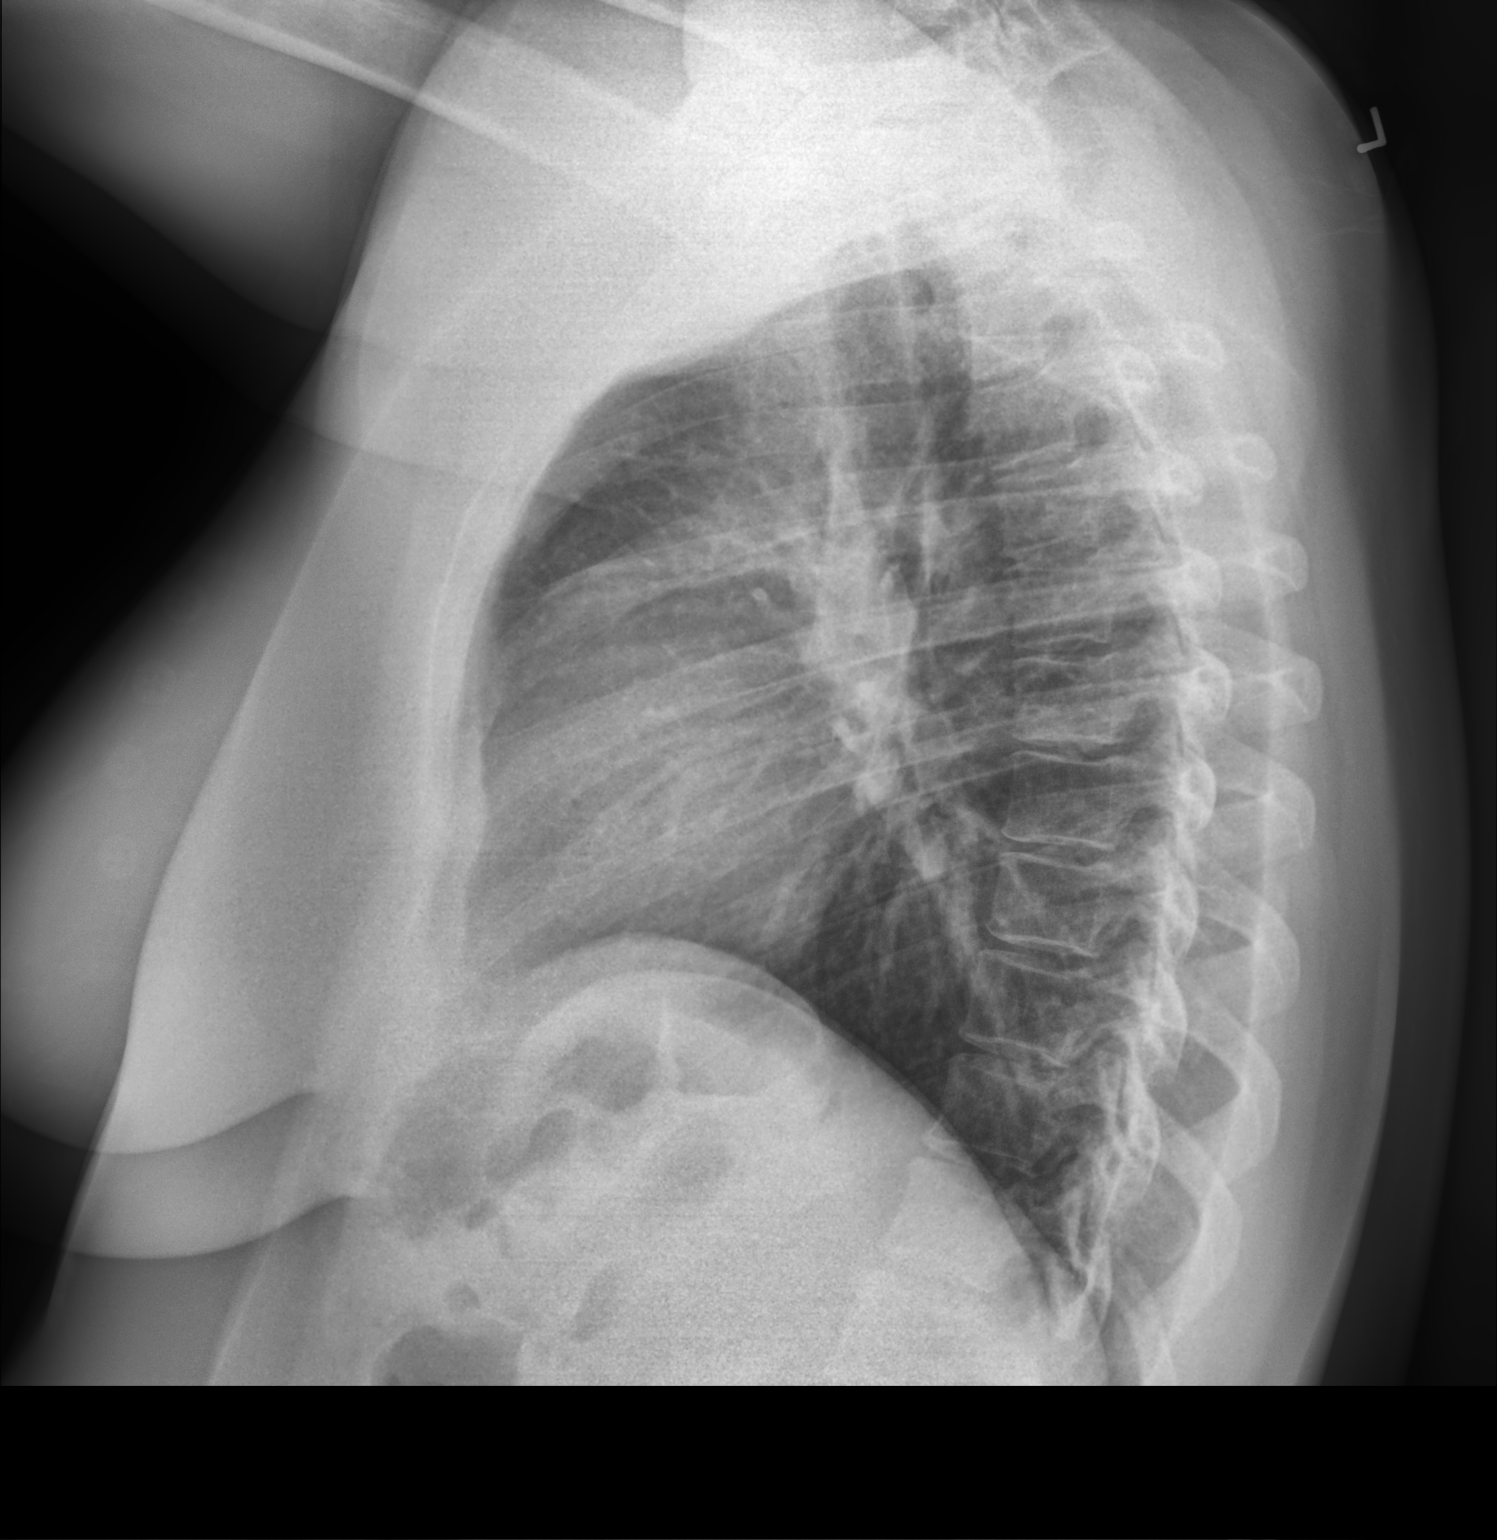

[2 of 2 positions shown; findings below may reference images not displayed]

FINDINGS: The heart size and mediastinal contours are within normal limits.
Both lungs are clear. The visualized skeletal structures are
unremarkable.
IMPRESSION: No active cardiopulmonary disease.

## 2020-08-08 ENCOUNTER — Encounter (HOSPITAL_COMMUNITY): Payer: Self-pay | Admitting: Oncology

## 2020-08-15 ENCOUNTER — Ambulatory Visit: Payer: BC Managed Care – PPO | Admitting: Adult Health

## 2020-10-24 ENCOUNTER — Encounter (HOSPITAL_COMMUNITY): Payer: Self-pay | Admitting: Oncology

## 2020-10-26 ENCOUNTER — Encounter: Payer: Self-pay | Admitting: Adult Health

## 2020-10-26 ENCOUNTER — Other Ambulatory Visit: Payer: Self-pay

## 2020-10-26 ENCOUNTER — Ambulatory Visit: Payer: BC Managed Care – PPO | Admitting: Adult Health

## 2020-10-26 ENCOUNTER — Other Ambulatory Visit (HOSPITAL_COMMUNITY)
Admission: RE | Admit: 2020-10-26 | Discharge: 2020-10-26 | Disposition: A | Discharge status: Home / Self Care | Payer: BC Managed Care – PPO | Source: Ambulatory Visit | Attending: Adult Health | Admitting: Adult Health

## 2020-10-26 ENCOUNTER — Encounter (HOSPITAL_COMMUNITY): Payer: Self-pay | Admitting: Oncology

## 2020-10-26 VITALS — BP 136/88 | HR 93 | Ht 63.0 in | Wt 210.0 lb

## 2020-10-26 DIAGNOSIS — Z113 Encounter for screening for infections with a predominantly sexual mode of transmission: Secondary | ICD-10-CM | POA: Diagnosis not present

## 2020-10-26 DIAGNOSIS — F419 Anxiety disorder, unspecified: Secondary | ICD-10-CM | POA: Diagnosis not present

## 2020-10-26 DIAGNOSIS — F32A Depression, unspecified: Secondary | ICD-10-CM | POA: Diagnosis not present

## 2020-10-26 HISTORY — DX: Encounter for screening for infections with a predominantly sexual mode of transmission: Z11.3

## 2020-10-26 MED ORDER — SERTRALINE HCL 50 MG PO TABS: 50.0000 mg | ORAL_TABLET | Freq: Every day | ORAL | 3 refills | Status: DC

## 2020-10-26 MED ORDER — HYDROXYZINE HCL 10 MG PO TABS: 10.0000 mg | ORAL_TABLET | Freq: Three times a day (TID) | ORAL | 3 refills | Status: DC | PRN

## 2020-10-26 NOTE — Progress Notes (Signed)
Subjective:     Patient ID: Hannah Osborn, female   DOB: 1985-04-10, 35 y.o.   MRN: [id number]  HPI Hannah Osborn is a 35 year old black female, single, G1P1 in requesting STD testing, he cheated, she denies any discharge or burning. And she has been off Zoloft for about 3 months, no insurance, and now has and wants to get back on it.  She is working at [employer] in [employer].  PCP is Dr Karie Kirks. Lab Results  Component Value Date   DIAGPAP  03/21/2020    - Negative for intraepithelial lesion or malignancy (NILM)   HPV NOT DETECTED 04/01/2017   [place] Negative 03/21/2020    Review of Systems +anxiety and depression Denies any discharge or burning Legs ache with period at times. Reviewed past medical,surgical, social and family history. Reviewed medications and allergies.     Objective:   Physical Exam BP 136/88 (BP Location: Left Arm, Patient Position: Sitting, Cuff Size: Normal)   Pulse 93   Ht 5\' 3"  (1.6 m)   Wt 210 lb (95.3 kg)   LMP 10/13/2020   BMI 37.20 kg/m     Skin warm and dry.Pelvic: external genitalia is normal in appearance no lesions, vagina: pink and moist,urethra has no lesions or masses noted, cervix:smooth and bulbous, uterus: normal size, shape and contour, non tender, no masses felt, adnexa: no masses or tenderness noted. Bladder is non tender and no masses felt. CV swab obtained. Fall risk is low Depression screen [facility] 2/9 10/26/2020 05/16/2020 03/21/2020  Decreased Interest 1 1 2   Down, Depressed, Hopeless 2 2 2   PHQ - 2 Score 3 3 4   Altered sleeping 3 2 2   Tired, decreased energy 3 2 2   Change in appetite 3 1 2   Feeling bad or failure about yourself  3 2 2   Trouble concentrating 3 1 2   Moving slowly or fidgety/restless 2 0 0  Suicidal thoughts 1 0 2  PHQ-9 Score 21 11 16     GAD 7 : Generalized Anxiety Score 10/26/2020 05/16/2020 03/21/2020  Nervous, Anxious, on Edge 3 1 2   Control/stop worrying 3 2 2   Worry too much - different things 3 1 2   Trouble relaxing 3 1 2    Restless 2 0 3  Easily annoyed or irritable 2 2 2   Afraid - awful might happen 3 0 2  Total GAD 7 Score 19 7 15       Upstream - 10/26/20 1609       Pregnancy Intention Screening   Does the patient want to become pregnant in the next year? No    Does the patient's partner want to become pregnant in the next year? No    Would the patient like to discuss contraceptive options today? No      Contraception Wrap Up   Current Method Female Sterilization    End Method Female Sterilization    Contraception Counseling Provided No            Examination chaperoned by Merril Abbe LPN Assessment:     1. Screen for STD (sexually transmitted disease) CV swab sent for GC/CHL,trich and BV,yeast - Cervicovaginal ancillary only( ) - HIV Antibody (routine testing w rflx) - RPR  2. Anxiety and depression Will restart zoloft 50 mg daily  Will rx vistaril for anxiety Meds ordered this encounter  Medications   sertraline (ZOLOFT) 50 MG tablet    Sig: Take 1 tablet (50 mg total) by mouth daily.    Dispense:  30  tablet    Refill:  3    Order Specific Question:   Supervising Provider    Answer:   Tania Ade H [[id number]]   hydrOXYzine (ATARAX/VISTARIL) 10 MG tablet    Sig: Take 1 tablet (10 mg total) by mouth 3 (three) times daily as needed.    Dispense:  60 tablet    Refill:  3    Order Specific Question:   Supervising Provider    Answer:   Florian Buff [[id number]]   She is going to counseling at [place], with Smitty Pluck    Plan:     Follow up in 8 weeks with me

## 2020-10-27 LAB — RPR: RPR Ser Ql: NONREACTIVE

## 2020-10-27 LAB — HIV ANTIBODY (ROUTINE TESTING W REFLEX): HIV Screen 4th Generation wRfx: NONREACTIVE

## 2020-10-30 LAB — CERVICOVAGINAL ANCILLARY ONLY
Bacterial Vaginitis (gardnerella): NEGATIVE
Candida Glabrata: NEGATIVE
Candida Vaginitis: NEGATIVE
Chlamydia: NEGATIVE
Comment: NEGATIVE
Comment: NEGATIVE
Comment: NEGATIVE
Comment: NEGATIVE
Comment: NEGATIVE
Comment: NORMAL
Neisseria Gonorrhea: NEGATIVE
Trichomonas: NEGATIVE

## 2020-12-21 ENCOUNTER — Other Ambulatory Visit: Payer: Self-pay

## 2020-12-21 ENCOUNTER — Encounter: Payer: Self-pay | Admitting: Adult Health

## 2020-12-21 ENCOUNTER — Ambulatory Visit: Payer: BC Managed Care – PPO | Admitting: Adult Health

## 2020-12-21 VITALS — BP 107/74 | HR 80 | Ht 63.0 in | Wt 213.0 lb

## 2020-12-21 DIAGNOSIS — F32A Depression, unspecified: Secondary | ICD-10-CM | POA: Diagnosis not present

## 2020-12-21 DIAGNOSIS — F419 Anxiety disorder, unspecified: Secondary | ICD-10-CM | POA: Diagnosis not present

## 2020-12-21 MED ORDER — SERTRALINE HCL 100 MG PO TABS: 100.0000 mg | ORAL_TABLET | Freq: Every day | ORAL | 3 refills | Status: DC

## 2020-12-21 MED ORDER — HYDROXYZINE HCL 10 MG PO TABS: 10.0000 mg | ORAL_TABLET | Freq: Three times a day (TID) | ORAL | 3 refills | Status: DC | PRN

## 2020-12-21 NOTE — Progress Notes (Signed)
Subjective:     Patient ID: Hannah Osborn, female   DOB: 05-13-1985, 35 y.o.   MRN: [id number]  HPI Hannah Osborn is a 35 year old black female,single, G1P1 back in follow up on restarting zoloft and is better but thinks it needs to be increased. She is going to counseling every Monday.   Lab Results  Component Value Date   DIAGPAP  03/21/2020    - Negative for intraepithelial lesion or malignancy (NILM)   HPV NOT DETECTED 04/01/2017   [place] Negative 03/21/2020   PCP is Dr Karie Kirks  Review of Systems Still depressed and anxious Reviewed past medical,surgical, social and family history. Reviewed medications and allergies.     Objective:   Physical Exam BP 107/74 (BP Location: Left Arm, Patient Position: Sitting, Cuff Size: Large)   Pulse 80   Ht 5\' 3"  (1.6 m)   Wt 213 lb (96.6 kg)   LMP 11/29/2020   BMI 37.73 kg/m     Skin warm and dry.  Lungs: clear to ausculation bilaterally. Cardiovascular: regular rate and rhythm.  Depression screen [facility] 2/9 12/21/2020 10/26/2020 05/16/2020  Decreased Interest 2 1 1   Down, Depressed, Hopeless 2 2 2   PHQ - 2 Score 4 3 3   Altered sleeping 3 3 2   Tired, decreased energy 3 3 2   Change in appetite 2 3 1   Feeling bad or failure about yourself  3 3 2   Trouble concentrating 3 3 1   Moving slowly or fidgety/restless 1 2 0  Suicidal thoughts 2 1 0  PHQ-9 Score 21 21 11   Difficult doing work/chores Somewhat difficult - -    GAD 7 : Generalized Anxiety Score 12/21/2020 10/26/2020 05/16/2020 03/21/2020  Nervous, Anxious, on Edge 3 3 1 2   Control/stop worrying 3 3 2 2   Worry too much - different things 3 3 1 2   Trouble relaxing 3 3 1 2   Restless 1 2 0 3  Easily annoyed or irritable 2 2 2 2   Afraid - awful might happen 3 3 0 2  Total GAD 7 Score 18 19 7 15   Anxiety Difficulty Somewhat difficult - - -     [place] is low  Upstream - 12/21/20 1546       Pregnancy Intention Screening   Does the patient want to become pregnant in the next year? No     Does the patient's partner want to become pregnant in the next year? No    Would the patient like to discuss contraceptive options today? No      Contraception Wrap Up   Current Method Female Sterilization    End Method Female Sterilization    Contraception Counseling Provided No             Assessment:     1. Anxiety and depression Will increase zoloft to 75 mg for 1 week then increase to 100 mg every day and will refill vistaril, will also refer to [place] ordered this encounter  Medications   sertraline (ZOLOFT) 100 MG tablet    Sig: Take 1 tablet (100 mg total) by mouth daily.    Dispense:  30 tablet    Refill:  3    Order Specific Question:   Supervising Provider    Answer:   Tania Ade H [[id number]]   hydrOXYzine (ATARAX) 10 MG tablet    Sig: Take 1 tablet (10 mg total) by mouth 3 (three) times daily as needed.    Dispense:  60 tablet  Refill:  3    Order Specific Question:   Supervising Provider    Answer:   Florian Buff [[id number]]       Plan:     Follow up in 8 weeks  If has thoughts of hurting self go to ER and she agrees

## 2021-01-23 ENCOUNTER — Encounter (HOSPITAL_COMMUNITY): Payer: Self-pay | Admitting: Oncology

## 2021-02-15 ENCOUNTER — Other Ambulatory Visit: Payer: Self-pay

## 2021-02-15 ENCOUNTER — Ambulatory Visit: Payer: BC Managed Care – PPO | Admitting: Adult Health

## 2021-02-15 ENCOUNTER — Encounter: Payer: Self-pay | Admitting: Adult Health

## 2021-02-15 VITALS — BP 118/82 | HR 95 | Ht 63.0 in | Wt 214.0 lb

## 2021-02-15 DIAGNOSIS — R102 Pelvic and perineal pain: Secondary | ICD-10-CM

## 2021-02-15 DIAGNOSIS — F419 Anxiety disorder, unspecified: Secondary | ICD-10-CM

## 2021-02-15 DIAGNOSIS — F32A Depression, unspecified: Secondary | ICD-10-CM | POA: Diagnosis not present

## 2021-02-15 MED ORDER — BUSPIRONE HCL 5 MG PO TABS: 5.0000 mg | ORAL_TABLET | Freq: Three times a day (TID) | ORAL | 1 refills | Status: DC

## 2021-02-15 MED ORDER — TRAZODONE HCL 50 MG PO TABS: 50.0000 mg | ORAL_TABLET | Freq: Every day | ORAL | 3 refills | Status: DC

## 2021-02-15 NOTE — Progress Notes (Signed)
Subjective:     Patient ID: Hannah Osborn, female   DOB: 09-12-1985, 36 y.o.   MRN: [id number]  HPI Hannah Osborn is a 36 year old black female,single, G1P1 in follow up on anxiety and depression, and feels anxious.  Lab Results  Component Value Date   DIAGPAP  03/21/2020    - Negative for intraepithelial lesion or malignancy (NILM)   HPV NOT DETECTED 04/01/2017   [place] Negative 03/21/2020   PCP is Dr Karie Kirks. Review of Systems +anxious Feels like skin crawling Has shorter periods, but some pain since ablation Reviewed past medical,surgical, social and family history. Reviewed medications and allergies.     Objective:   Physical Exam BP 118/82 (BP Location: Left Arm, Patient Position: Sitting, Cuff Size: Large)    Pulse 95    Ht 5\' 3"  (1.6 m)    Wt 214 lb (97.1 kg)    LMP 02/15/2021 (Exact Date)    BMI 37.91 kg/m      Skin warm and dry. Lungs: clear to ausculation bilaterally. Cardiovascular: regular rate and rhythm.  Pelvic is deferred.  Depression screen [facility] 2/9 02/15/2021 12/21/2020 10/26/2020  Decreased Interest 1 2 1   Down, Depressed, Hopeless 3 2 2   PHQ - 2 Score 4 4 3   Altered sleeping 3 3 3   Tired, decreased energy 3 3 3   Change in appetite 2 2 3   Feeling bad or failure about yourself  3 3 3   Trouble concentrating 3 3 3   Moving slowly or fidgety/restless 1 1 2   Suicidal thoughts 2 2 1   PHQ-9 Score 21 21 21   Difficult doing work/chores - Somewhat difficult -    GAD 7 : Generalized Anxiety Score 02/15/2021 12/21/2020 10/26/2020 05/16/2020  Nervous, Anxious, on Edge 3 3 3 1   Control/stop worrying 3 3 3 2   Worry too much - different things 3 3 3 1   Trouble relaxing 3 3 3 1   Restless 1 1 2  0  Easily annoyed or irritable 2 2 2 2   Afraid - awful might happen 3 3 3  0  Total GAD 7 Score 18 18 19 7   Anxiety Difficulty - Somewhat difficult - -      Upstream - 02/15/21 1611       Pregnancy Intention Screening   Does the patient want to become pregnant in the next year? N/A     Does the patient's partner want to become pregnant in the next year? N/A    Would the patient like to discuss contraceptive options today? N/A      Contraception Wrap Up   Current Method Female Sterilization    End Method Female Sterilization    Contraception Counseling Provided No             Assessment:    1. Anxiety and depression Will continue Zoloft, has refills Will ad trazodone and Buspar Meds ordered this encounter  Medications   busPIRone (BUSPAR) 5 MG tablet    Sig: Take 1 tablet (5 mg total) by mouth 3 (three) times daily.    Dispense:  90 tablet    Refill:  1    Order Specific Question:   Supervising Provider    Answer:   Elonda Husky, LUTHER H [[id number]]   traZODone (DESYREL) 50 MG tablet    Sig: Take 1 tablet (50 mg total) by mouth at bedtime.    Dispense:  30 tablet    Refill:  3    Order Specific Question:   Supervising Provider    Answer:  EURE, LUTHER H [[id number]]    She has vistaril if needed, prn   2. Pelvic pain Will get [place] at follow up visit      Plan:     Follow up with me in 6 weeks

## 2021-03-22 ENCOUNTER — Other Ambulatory Visit: Payer: Self-pay | Admitting: Adult Health

## 2021-03-22 DIAGNOSIS — R102 Pelvic and perineal pain: Secondary | ICD-10-CM

## 2021-03-22 NOTE — Progress Notes (Signed)
Will get [place] 3/3/ at 8:30 am at [facility] to assess pelvic pain ?

## 2021-03-23 ENCOUNTER — Ambulatory Visit (HOSPITAL_COMMUNITY)
Admission: RE | Admit: 2021-03-23 | Discharge: 2021-03-23 | Disposition: A | Discharge status: Home / Self Care | Payer: BC Managed Care – PPO | Source: Ambulatory Visit | Attending: Adult Health | Admitting: Adult Health

## 2021-03-23 ENCOUNTER — Other Ambulatory Visit: Payer: Self-pay

## 2021-03-23 DIAGNOSIS — R102 Pelvic and perineal pain: Secondary | ICD-10-CM | POA: Diagnosis present

## 2021-03-29 ENCOUNTER — Other Ambulatory Visit: Payer: BC Managed Care – PPO

## 2021-03-29 ENCOUNTER — Encounter: Payer: Self-pay | Admitting: Adult Health

## 2021-03-29 ENCOUNTER — Other Ambulatory Visit: Payer: Self-pay

## 2021-03-29 ENCOUNTER — Ambulatory Visit: Payer: BC Managed Care – PPO | Admitting: Adult Health

## 2021-03-29 VITALS — BP 132/77 | HR 109 | Ht 63.0 in | Wt 215.0 lb

## 2021-03-29 DIAGNOSIS — N946 Dysmenorrhea, unspecified: Secondary | ICD-10-CM | POA: Diagnosis not present

## 2021-03-29 DIAGNOSIS — D219 Benign neoplasm of connective and other soft tissue, unspecified: Secondary | ICD-10-CM

## 2021-03-29 DIAGNOSIS — F32A Depression, unspecified: Secondary | ICD-10-CM

## 2021-03-29 DIAGNOSIS — F419 Anxiety disorder, unspecified: Secondary | ICD-10-CM | POA: Diagnosis not present

## 2021-03-29 DIAGNOSIS — R140 Abdominal distension (gaseous): Secondary | ICD-10-CM | POA: Diagnosis not present

## 2021-03-29 MED ORDER — HYDROXYZINE HCL 10 MG PO TABS: 10.0000 mg | ORAL_TABLET | Freq: Three times a day (TID) | ORAL | 3 refills | Status: DC | PRN

## 2021-03-29 MED ORDER — SERTRALINE HCL 50 MG PO TABS: ORAL_TABLET | ORAL | 3 refills | Status: DC

## 2021-03-29 NOTE — Progress Notes (Signed)
?Subjective:  ?  ? Patient ID: Hannah Osborn, female   DOB: [birth year], 36 y.o.   MRN: [id number] ? ?HPI ?Hannah Osborn is a 36 year old black female.single,G1P1 back in follow up on adding buspar and trazodone to Zoloft, and still anxious and irritable. She is still going to counseling.  ?PCP is Dr Karie Kirks. ?Lab Results  ?Component Value Date  ? DIAGPAP  03/21/2020  ?  - Negative for intraepithelial lesion or malignancy (NILM)  ? HPV NOT DETECTED 04/01/2017  ? [place] Negative 03/21/2020  ?  ?Review of Systems ?Irritable ?+anxious ?+pain with periods and bloating, had ablation ?Reviewed past medical,surgical, social and family history. Reviewed medications and allergies.  ?   ?Objective:  ? Physical Exam ?BP 132/77 (BP Location: Left Arm, Patient Position: Sitting, Cuff Size: Large)   Pulse (!) 109   Ht '5\' 3"'$  (1.6 m)   Wt 215 lb (97.5 kg)   LMP 03/20/2021 (Exact Date)   BMI 38.09 kg/m?   ?  Skin warm and dry.Lungs: clear to ausculation bilaterally. Cardiovascular: regular rate and rhythm.  ?She had [place] 03/23/21:   Exophytic leiomyoma at posterior mid uterus 6.4 cm greatest size. ?  ?Otherwise negative exam. ?  ? Upstream - 03/29/21 1352   ? ?  ? Contraception Wrap Up  ? End Method Female Sterilization   ? Contraception Counseling Provided No   ? ?  ?  ? ?  ?  ?Depression screen [facility] 2/9 03/29/2021 02/15/2021 12/21/2020  ?Decreased Interest '3 1 2  '$ ?Down, Depressed, Hopeless '3 3 2  '$ ?PHQ - 2 Score '6 4 4  '$ ?Altered sleeping '2 3 3  '$ ?Tired, decreased energy '3 3 3  '$ ?Change in appetite '2 2 2  '$ ?Feeling bad or failure about yourself  '3 3 3  '$ ?Trouble concentrating '3 3 3  '$ ?Moving slowly or fidgety/restless '1 1 1  '$ ?Suicidal thoughts '3 2 2  '$ ?PHQ-9 Score '23 21 21  '$ ?Difficult doing work/chores - - Somewhat difficult  ?Some recent data might be hidden  ?  ?GAD 7 : Generalized Anxiety Score 03/29/2021 02/15/2021 12/21/2020 10/26/2020  ?Nervous, Anxious, on Edge '2 3 3 3  '$ ?Control/stop worrying '2 3 3 3  '$ ?Worry too much - different things '3 3 3 3   '$ ?Trouble relaxing '3 3 3 3  '$ ?Restless '2 1 1 2  '$ ?Easily annoyed or irritable '1 2 2 2  '$ ?Afraid - awful might happen '3 3 3 3  '$ ?Total GAD 7 Score '16 18 18 19  '$ ?Anxiety Difficulty - - Somewhat difficult -  ? ?  ?Assessment:  ?   ?1. Anxiety and depression ?Will increase Zoloft to 150 mg daily ?Stop Buspar ?Will refill vistaril and continue trazodone ?Meds ordered this encounter  ?Medications  ? hydrOXYzine (ATARAX) 10 MG tablet  ?  Sig: Take 1 tablet (10 mg total) by mouth 3 (three) times daily as needed.  ?  Dispense:  60 tablet  ?  Refill:  3  ?  Order Specific Question:   Supervising Provider  ?  Answer:   Tania Ade H [[id number]]  ? sertraline (ZOLOFT) 50 MG tablet  ?  Sig: Take 3 daily at once  ?  Dispense:  90 tablet  ?  Refill:  3  ?  Order Specific Question:   Supervising Provider  ?  Answer:   Tania Ade H [[id number]]  ?  ? ?2. Dysmenorrhea ?Let me know if wants to talk with MD, about surgery options when new insurance  in effect ? ?3. Bloating ? ?  4. Fibroid ?Consider surgery to remove  ?Plan:  ?   ? Follow up in 8 weeks for ROS ?   ?

## 2021-05-02 ENCOUNTER — Other Ambulatory Visit: Payer: Self-pay | Admitting: Adult Health

## 2021-05-24 ENCOUNTER — Ambulatory Visit: Payer: BC Managed Care – PPO | Admitting: Adult Health

## 2021-06-22 ENCOUNTER — Ambulatory Visit
Admission: EM | Admit: 2021-06-22 | Discharge: 2021-06-22 | Disposition: A | Discharge status: Home / Self Care | Payer: BC Managed Care – PPO | Attending: Family Medicine | Admitting: Family Medicine

## 2021-06-22 ENCOUNTER — Encounter: Payer: Self-pay | Admitting: Emergency Medicine

## 2021-06-22 DIAGNOSIS — J4521 Mild intermittent asthma with (acute) exacerbation: Secondary | ICD-10-CM | POA: Diagnosis not present

## 2021-06-22 DIAGNOSIS — J069 Acute upper respiratory infection, unspecified: Secondary | ICD-10-CM

## 2021-06-22 DIAGNOSIS — J029 Acute pharyngitis, unspecified: Secondary | ICD-10-CM

## 2021-06-22 LAB — POCT RAPID STREP A (OFFICE): Rapid Strep A Screen: NEGATIVE

## 2021-06-22 MED ORDER — PROMETHAZINE-DM 6.25-15 MG/5ML PO SYRP: 5.0000 mL | ORAL_SOLUTION | Freq: Four times a day (QID) | ORAL | 0 refills | Status: DC | PRN

## 2021-06-22 MED ORDER — PREDNISONE 20 MG PO TABS: 40.0000 mg | ORAL_TABLET | Freq: Every day | ORAL | 0 refills | Status: DC

## 2021-06-22 MED ORDER — ALBUTEROL SULFATE HFA 108 (90 BASE) MCG/ACT IN AERS: 1.0000 | INHALATION_SPRAY | Freq: Four times a day (QID) | RESPIRATORY_TRACT | 0 refills | Status: DC | PRN

## 2021-06-22 NOTE — ED Triage Notes (Signed)
States she feels like her throat is closing, states she has had a sore throat.  States coughing makes it worse.  Symptoms since Saturday.

## 2021-06-22 NOTE — ED Provider Notes (Signed)
[facility]    CSN: [id number] Arrival date & time: 06/22/21  1823      History   Chief Complaint No chief complaint on file.   HPI Christi Wirick Burlison is a 36 y.o. female.   Presenting today with 5-day history of sore, swollen feeling throat, hacking cough, chest tightness, wheezing occasionally, fatigue.  Denies fever, chills, body aches, chest pain, abdominal pain, nausea vomiting or diarrhea.  History of asthma on albuterol as needed which has been providing mild temporary relief.  Otherwise not tried anything over-the-counter for symptoms.  Multiple sick contacts at work recently.   Past Medical History:  Diagnosis Date   Allergy    Anemia    Anxiety    Anxiety and depression 03/21/2020   COVID-19    Fibroid 01/08/2016   Hypothyroid 04/03/2017   Hypothyroidism    Thyroid disease     Patient Active Problem List   Diagnosis Date Noted   Bloating 03/29/2021   Pelvic pain 02/15/2021   Screen for STD (sexually transmitted disease) 10/26/2020   Rectocele 03/21/2020   Anxiety and depression 03/21/2020   Encounter for gynecological examination with Papanicolaou smear of cervix 03/21/2020   Hemorrhoids 03/21/2020   Dysmenorrhea 03/21/2020   Screening examination for STD (sexually transmitted disease) 07/22/2018   Encounter for well woman exam with routine gynecological exam 07/22/2018   Hypothyroidism 07/22/2018   Hypothyroid 04/03/2017   Internal and external bleeding hemorrhoids    Diarrhea, functional 03/06/2016   Rectal bleeding 03/06/2016   Fibroid 01/08/2016   Thyroid disorder 12/18/2015   Interstitial cystitis 12/18/2015   Anemia 12/18/2015   Functional ovarian cysts 12/18/2015   Menorrhagia 12/18/2015   Class 1 obesity due to excess calories with body mass index (BMI) of 34.0 to 34.9 in adult 12/18/2015   Environmental allergies 12/18/2015    Past Surgical History:  Procedure Laterality Date   CHOLECYSTECTOMY     DILITATION &  CURRETTAGE/HYSTROSCOPY WITH NOVASURE ABLATION N/A 02/02/2016   Procedure: DILATATION & CURETTAGE/HYSTEROSCOPY WITH NOVASURE ABLATION (procedure 2);  Surgeon: Florian Buff, MD;  Location: [facility];  Service: Gynecology;  Laterality: N/A;   HEMORRHOID SURGERY N/A 05/01/2016   Procedure: EXTENSIVE HEMORRHOIDECTOMY;  Surgeon: Aviva Signs, MD;  Location: [facility];  Service: General;  Laterality: N/A;   LAPAROSCOPIC BILATERAL SALPINGECTOMY Bilateral 02/02/2016   Procedure: LAPAROSCOPIC BILATERAL SALPINGECTOMY (procedure #1);  Surgeon: Florian Buff, MD;  Location: [facility];  Service: Gynecology;  Laterality: Bilateral;   TONSILLECTOMY     TUBAL LIGATION      OB History     Gravida  1   Para  1   Term  1   Preterm      AB      Living  1      SAB      IAB      Ectopic      Multiple      Live Births  1            Home Medications    Prior to Admission medications   Medication Sig Start Date End Date Taking? Authorizing Provider  albuterol (VENTOLIN HFA) 108 (90 Base) MCG/ACT inhaler Inhale 1-2 puffs into the lungs every 6 (six) hours as needed for wheezing or shortness of breath. 06/22/21  Yes Volney American, PA-C  predniSONE (DELTASONE) 20 MG tablet Take 2 tablets (40 mg total) by mouth daily with breakfast. 06/22/21  Yes Volney American, PA-C  promethazine-dextromethorphan (PROMETHAZINE-DM) 6.25-15 MG/5ML  syrup Take 5 mLs by mouth 4 (four) times daily as needed. 06/22/21  Yes Volney American, PA-C  albuterol (VENTOLIN HFA) 108 (90 Base) MCG/ACT inhaler Inhale 2 puffs into the lungs every 6 (six) hours as needed for wheezing or shortness of breath. 02/19/19   Terald Sleeper, PA-C  hydrOXYzine (ATARAX) 10 MG tablet Take 1 tablet (10 mg total) by mouth 3 (three) times daily as needed. 03/29/21   Estill Dooms, NP  ibuprofen (ADVIL) 800 MG tablet TAKE 1 TABLET BY MOUTH EVERY 8 HOURS AS NEEDED 05/02/21   Estill Dooms, NP  sertraline (ZOLOFT) 50 MG tablet Take  3 daily at once 03/29/21   Estill Dooms, NP  traZODone (DESYREL) 50 MG tablet Take 1 tablet (50 mg total) by mouth at bedtime. 02/15/21   Estill Dooms, NP    Family History Family History  Problem Relation Age of Onset   Pulmonary embolism Mother    Early death Mother    Cancer Father        stomach   Early death Father    Asthma Son    Allergies Son    Heart disease Maternal Uncle    Cancer Maternal Grandmother        uterine   Cancer Maternal Grandfather        colon    Social History Social History   Tobacco Use   Smoking status: Never   Smokeless tobacco: Never  Vaping Use   Vaping Use: Never used  Substance Use Topics   Alcohol use: Yes    Comment: occasional   Drug use: No     Allergies   Patient has no known allergies.   Review of Systems Review of Systems Per HPI  Physical Exam Triage Vital Signs ED Triage Vitals  Enc Vitals Group     BP 06/22/21 1854 134/80     Pulse Rate 06/22/21 1854 92     Resp 06/22/21 1854 18     Temp 06/22/21 1854 98.6 F (37 C)     Temp Source 06/22/21 1854 Oral     SpO2 06/22/21 1854 97 %     Weight --      Height --      Head Circumference --      Peak Flow --      Pain Score 06/22/21 1855 3     Pain Loc --      Pain Edu? --      Excl. in [place]? --    No data found.  Updated Vital Signs BP 134/80 (BP Location: Right Arm)   Pulse 92   Temp 98.6 F (37 C) (Oral)   Resp 18   LMP 05/29/2021 (Exact Date)   SpO2 97%   Visual Acuity Right Eye Distance:   Left Eye Distance:   Bilateral Distance:    Right Eye Near:   Left Eye Near:    Bilateral Near:     Physical Exam Vitals and nursing note reviewed.  Constitutional:      Appearance: Normal appearance.  HENT:     Head: Atraumatic.     Right Ear: Tympanic membrane and external ear normal.     Left Ear: Tympanic membrane and external ear normal.     Nose: Rhinorrhea present.     Mouth/Throat:     Mouth: Mucous membranes are moist.      Pharynx: Posterior oropharyngeal erythema present.  Eyes:     Extraocular Movements: Extraocular movements  intact.     Conjunctiva/sclera: Conjunctivae normal.  Cardiovascular:     Rate and Rhythm: Normal rate and regular rhythm.     Heart sounds: Normal heart sounds.  Pulmonary:     Effort: Pulmonary effort is normal.     Breath sounds: Wheezing present. No rales.     Comments: Trace wheezes bilaterally Musculoskeletal:        General: Normal range of motion.     Cervical back: Normal range of motion and neck supple.  Skin:    General: Skin is warm and dry.  Neurological:     Mental Status: She is alert and oriented to person, place, and time.  Psychiatric:        Mood and Affect: Mood normal.        Thought Content: Thought content normal.   UC Treatments / Results  Labs (all labs ordered are listed, but only abnormal results are displayed) Labs Reviewed  POCT RAPID STREP A (OFFICE)    EKG   Radiology No results found.  Procedures Procedures (including critical care time)  Medications Ordered in UC Medications - No data to display  Initial Impression / Assessment and Plan / UC Course  I have reviewed the triage vital signs and the nursing notes.  Pertinent labs & imaging results that were available during my care of the patient were reviewed by me and considered in my medical decision making (see chart for details).     Vital signs benign and reassuring, suspect viral upper respiratory infection causing asthma exacerbation.  Treat with prednisone, albuterol, Phenergan DM, supportive, and occasions and home care.  Return for worsening symptoms.  Final Clinical Impressions(s) / UC Diagnoses   Final diagnoses:  Viral URI with cough  Mild intermittent asthma with acute exacerbation   Discharge Instructions   None    ED Prescriptions     Medication Sig Dispense Auth. Provider   predniSONE (DELTASONE) 20 MG tablet Take 2 tablets (40 mg total) by mouth daily  with breakfast. 10 tablet Volney American, PA-C   albuterol (VENTOLIN HFA) 108 (90 Base) MCG/ACT inhaler Inhale 1-2 puffs into the lungs every 6 (six) hours as needed for wheezing or shortness of breath. [place], [place]   promethazine-dextromethorphan (PROMETHAZINE-DM) 6.25-15 MG/5ML syrup Take 5 mLs by mouth 4 (four) times daily as needed. 100 mL Volney American, [place]      PDMP not reviewed this encounter.   Volney American, [place] 06/22/21 1929

## 2021-09-18 ENCOUNTER — Other Ambulatory Visit: Payer: Self-pay

## 2021-09-18 ENCOUNTER — Emergency Department (HOSPITAL_COMMUNITY)
Admission: EM | Admit: 2021-09-18 | Discharge: 2021-09-18 | Disposition: A | Discharge status: Home / Self Care | Payer: BC Managed Care – PPO | Attending: Emergency Medicine | Admitting: Emergency Medicine

## 2021-09-18 ENCOUNTER — Encounter (HOSPITAL_COMMUNITY): Payer: Self-pay

## 2021-09-18 DIAGNOSIS — U071 COVID-19: Secondary | ICD-10-CM | POA: Diagnosis not present

## 2021-09-18 DIAGNOSIS — R059 Cough, unspecified: Secondary | ICD-10-CM | POA: Diagnosis present

## 2021-09-18 LAB — RESP PANEL BY RT-PCR (FLU A&B, COVID) ARPGX2
Influenza A by PCR: NEGATIVE
Influenza B by PCR: NEGATIVE
SARS Coronavirus 2 by RT PCR: POSITIVE — AB

## 2021-09-18 LAB — GROUP A STREP BY PCR: Group A Strep by PCR: NOT DETECTED

## 2021-09-18 MED ORDER — ACETAMINOPHEN 325 MG PO TABS
650.0000 mg | ORAL_TABLET | Freq: Once | ORAL | Status: AC
  Administered 2021-09-18: 650 mg via ORAL
  Filled 2021-09-18: qty 2

## 2021-09-18 NOTE — ED Triage Notes (Signed)
Pt endorses congestion, cough, stuffy/runny nose, headache, dizziness, body aches, chills starting 8/24.  Tylenol and ibuprofen for relief.  Needs covid test for work.

## 2021-09-18 NOTE — Discharge Instructions (Signed)
You are diagnosed with COVID.  I will write you off for the rest of the week to quarantine.  Please follow-up with your primary care doctor for further evaluation.  [place] to [place] [facility] for any worsening symptoms.

## 2021-09-18 NOTE — ED Provider Notes (Signed)
[facility] EMERGENCY DEPARTMENT Provider Note   CSN: [id number] Arrival date & time: 09/18/21  0957     History Chief Complaint  Patient presents with   Cough    Hannah Osborn is a 36 y.o. female patient who presents to the emergency part with a 6-day history of nasal congestion, cough, fatigue, general malaise, generalized weakness.  Coming in today to get tested for COVID.  She denies any nausea, vomiting, diarrhea.   Cough      Home Medications Prior to Admission medications   Medication Sig Start Date End Date Taking? Authorizing Provider  albuterol (VENTOLIN HFA) 108 (90 Base) MCG/ACT inhaler Inhale 2 puffs into the lungs every 6 (six) hours as needed for wheezing or shortness of breath. 02/19/19   Terald Sleeper, PA-C  albuterol (VENTOLIN HFA) 108 (90 Base) MCG/ACT inhaler Inhale 1-2 puffs into the lungs every 6 (six) hours as needed for wheezing or shortness of breath. 06/22/21   Volney American, PA-C  hydrOXYzine (ATARAX) 10 MG tablet Take 1 tablet (10 mg total) by mouth 3 (three) times daily as needed. 03/29/21   Estill Dooms, NP  ibuprofen (ADVIL) 800 MG tablet TAKE 1 TABLET BY MOUTH EVERY 8 HOURS AS NEEDED 05/02/21   Estill Dooms, NP  predniSONE (DELTASONE) 20 MG tablet Take 2 tablets (40 mg total) by mouth daily with breakfast. 06/22/21   Volney American, PA-C  promethazine-dextromethorphan (PROMETHAZINE-DM) 6.25-15 MG/5ML syrup Take 5 mLs by mouth 4 (four) times daily as needed. 06/22/21   Volney American, PA-C  sertraline (ZOLOFT) 50 MG tablet Take 3 daily at once 03/29/21   Estill Dooms, NP  traZODone (DESYREL) 50 MG tablet Take 1 tablet (50 mg total) by mouth at bedtime. 02/15/21   Estill Dooms, NP      Allergies    Patient has no known allergies.    Review of Systems   Review of Systems  Respiratory:  Positive for cough.   All other systems reviewed and are negative.   Physical Exam Updated Vital Signs BP 120/87 (BP  Location: Left Arm)   Pulse 87   Temp 98.2 F (36.8 C) (Oral)   Resp 14   Ht '5\' 3"'$  (1.6 m)   Wt 99.8 kg   SpO2 99%   BMI 38.97 kg/m  Physical Exam Vitals and nursing note reviewed.  Constitutional:      General: She is not in acute distress.    Appearance: Normal appearance.  HENT:     Head: Normocephalic and atraumatic.  Eyes:     General:        Right eye: No discharge.        Left eye: No discharge.  Cardiovascular:     Comments: Regular rate and rhythm.  S1/S2 are distinct without any evidence of murmur, rubs, or gallops.  Radial pulses are 2+ bilaterally.  Dorsalis pedis pulses are 2+ bilaterally.  No evidence of pedal edema. Pulmonary:     Comments: Clear to auscultation bilaterally.  Normal effort.  No respiratory distress.  No evidence of wheezes, rales, or rhonchi heard throughout. Abdominal:     General: Abdomen is flat. Bowel sounds are normal. There is no distension.     Tenderness: There is no abdominal tenderness. There is no guarding or rebound.  Musculoskeletal:        General: Normal range of motion.     Cervical back: Neck supple.  Skin:    General: Skin is  warm and dry.     Findings: No rash.  Neurological:     General: No focal deficit present.     Mental Status: She is alert.  Psychiatric:        Mood and Affect: Mood normal.        Behavior: Behavior normal.     ED Results / Procedures / Treatments   Labs (all labs ordered are listed, but only abnormal results are displayed) Labs Reviewed  RESP PANEL BY RT-PCR (FLU A&B, COVID) ARPGX2 - Abnormal; Notable for the following components:      Result Value   SARS Coronavirus 2 by RT PCR POSITIVE (*)    All other components within normal limits  GROUP A STREP BY PCR    EKG None  Radiology No results found.  Procedures Procedures   Medications Ordered in ED Medications  acetaminophen (TYLENOL) tablet 650 mg (650 mg Oral Given 09/18/21 1141)    ED Course/ Medical Decision Making/ A&P                            Medical Decision Making Hannah Osborn is a 36 y.o. female patient who presents to the ED today for a COVID test.  Patient having symptoms related to COVID or possible URI.  We will swab her for COVID and flu.  I do not feel imaging is warranted.  Lung sounds are clear bilaterally and have a low suspicion for pneumonia at this time.  She is in no respiratory distress.  Vital signs are completely normal.  I will plan to reassess once respiratory panel returns.  Patient tested positive for COVID.  This is likely the cause of her symptoms.  We will write her out for the rest of the week from work to quarantine.  She will follow-up with her primary care doctor for further evaluation.  Strict return precaution discussed.  She is safe for discharge.  Risk OTC drugs.   Final Clinical Impression(s) / ED Diagnoses Final diagnoses:  COVID    Rx / DC Orders ED Discharge Orders     None         Hendricks Limes, [place] 09/18/21 1327    Sherwood Gambler, MD 09/19/21 1704

## 2021-10-09 ENCOUNTER — Other Ambulatory Visit: Payer: Self-pay | Admitting: Adult Health

## 2021-10-15 ENCOUNTER — Encounter: Payer: Self-pay | Admitting: Internal Medicine

## 2021-10-29 ENCOUNTER — Telehealth: Payer: Self-pay | Admitting: Internal Medicine

## 2021-10-29 NOTE — Telephone Encounter (Signed)
Pt left [place] a message saying she wanted to change her appt time on 10/16.  I left her a message saying on that day there was no later time but could switch the appt to a different day, [address] 26 with Dr. Abbey Chatters at 2:15 if she wanted to do that.    Asked her to please call back and let me know what she wanted to do.

## 2021-11-05 ENCOUNTER — Ambulatory Visit: Payer: BC Managed Care – PPO | Admitting: Gastroenterology

## 2021-11-18 NOTE — Progress Notes (Unsigned)
GI Office Note    Referring Provider: Leslie Andrea, MD Primary Care Physician:  Leslie Andrea, MD  Primary Gastroenterologist: Elon Alas. Abbey Chatters, DO  Chief Complaint   Chief Complaint  Patient presents with   Hemorrhoids    Here for abnormal liver labs, states that she also has issues with hemorrhoids. States that her gyn also told her that she had some rectal prolapse but nothing needing surgery at this time.    History of Present Illness   Hannah Osborn is a 36 y.o. female presenting today at the request of Leslie Andrea, MD for iron deficiency anemia.   Flexible sigmoidoscopy in 2011 with localized distal proctitis with small telangiectasia and small external hemorrhoids. Pathology with hyperemic colonic epithelium.   Previously seen by Dr. Oneida Alar. Last office visit in October 2018 for internal and external hemorrhoids. Reported prior hemorrhoid surgery, pain with using the bathroom and having rectal bleeding with stools. Various stool types reported Having rectal pressure and some nausea. Advised [facility] cream QID for 10 days, colace TID. Advised to follow up with Dr. Arnoldo Morale and use dicyclomine up to TID for any diarrhea.   PCP visit 09/26/21: Reported history of COVID 3 times, needed school paperwork filled out and TB test. 3 months prior reported feeling like something stuck in her throat and it closing up and referred to ENT but had not been seen. Referred to Dr. Benjamine Mola with ENT.   Labs 10/01/21: Hgb 9.6, MCV 67.7, MCH 20.8, plt 359, Iron 26, saturations 6%, ferritin 2, Vitamin B12 and folate wnl, LDL elevated, CMP unremarkable, TSH wnl. Vitamin D low at 9. Dr. Karie Kirks advised vitamin D supplementation, workup for IDA and Cologuard. (Suspected anemia due to menstruation. Advised to begin ferrous sulfate 325 mg BID.   Follow with OBGYN and has painful periods requiring prescription strength ibuprofen (800 mg). 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 Last visit noted uterine fibroids and is to think about talking with MD regarding surgical options.   Today: Does have some BRBPR that sometimes does fill up the commode and also does have fatigue. Denies melena. May sometimes have some abdominal pain that is cramping in nature. Had a uterine ablation about 5-6 years ago and now has worsening period pain after the ablation. Menstrual bleeding is labile, at times is heavier than others. Does have PICA. Does have a lack of appetite and some early satiety. Denies unintentional weight loss. Denies chest pain or shortness of breath. Does have a history of hemorrhoids and had surgery and feels like they are back. Has used preparation H in the past. States her hemorrhoids are usually on the outside.   Does have constipation for as long as she can remember. Takes a stool softener or laxative about every 3 days or so. May have a BM on her own but a lot of times she takes something for her to go. States that GYN reports she has some mild rectal prolapse.  Has discomfort with each bowel movement. Mostly just rectal pain. States she has had rectal fissures before in the past as well. Pain is similar to back then. Has been bleeding with every bowel movement and ten she is worse out as well. Is supposed to be taking oral iron but she is scared to take it due to her constipation. Even with stool softener she was having increased firmness to her stools. Has done colace and miralax and has not worked well in the past. Sometimes  after a BM she has a sensation all the way up her anal canal.   Denies upper abdominal pain. Has occasional reflux symptoms every few days, does not take anything for it. Has used tums in the past.   States she has been to hematology before in the past for blood transfusions and iron infusions and would like to do oral therapy and not IV therapy. Reports she has been through this cycle of procedures and hemorrhoid surgery and  various treatments for IDA.   Current Outpatient Medications  Medication Sig Dispense Refill   albuterol (VENTOLIN HFA) 108 (90 Base) MCG/ACT inhaler Inhale 1-2 puffs into the lungs every 6 (six) hours as needed for wheezing or shortness of breath. 18 g 0   FLOVENT HFA 110 MCG/ACT inhaler SMARTSIG:2 Puff(s) By Mouth Twice Daily     hydrOXYzine (ATARAX) 10 MG tablet Take 1 tablet (10 mg total) by mouth 3 (three) times daily as needed. 60 tablet 3   ibuprofen (ADVIL) 800 MG tablet TAKE 1 TABLET BY MOUTH EVERY 8 HOURS AS NEEDED 60 tablet 1   methocarbamol (ROBAXIN) 750 MG tablet SMARTSIG:1 Tablet(s) By Mouth 1-4 Times Daily     sertraline (ZOLOFT) 50 MG tablet Take 3 daily at once 90 tablet 3   traZODone (DESYREL) 50 MG tablet Take 1 tablet (50 mg total) by mouth at bedtime. 30 tablet 3   No current facility-administered medications for this visit.    Past Medical History:  Diagnosis Date   Allergy    Anemia    Anxiety    Anxiety and depression 03/21/2020   COVID-19    Fibroid 01/08/2016   Hypothyroid 04/03/2017   Hypothyroidism    Thyroid disease     Past Surgical History:  Procedure Laterality Date   CHOLECYSTECTOMY     DILITATION & CURRETTAGE/HYSTROSCOPY WITH NOVASURE ABLATION N/A 02/02/2016   Procedure: DILATATION & CURETTAGE/HYSTEROSCOPY WITH NOVASURE ABLATION (procedure 2);  Surgeon: Florian Buff, MD;  Location: [facility];  Service: Gynecology;  Laterality: N/A;   HEMORRHOID SURGERY N/A 05/01/2016   Procedure: EXTENSIVE HEMORRHOIDECTOMY;  Surgeon: Aviva Signs, MD;  Location: [facility];  Service: General;  Laterality: N/A;   LAPAROSCOPIC BILATERAL SALPINGECTOMY Bilateral 02/02/2016   Procedure: LAPAROSCOPIC BILATERAL SALPINGECTOMY (procedure #1);  Surgeon: Florian Buff, MD;  Location: [facility];  Service: Gynecology;  Laterality: Bilateral;   TONSILLECTOMY     TUBAL LIGATION      Family History  Problem Relation Age of Onset   Pulmonary embolism Mother    Early death Mother     Cancer Father        stomach   Early death Father    Asthma Son    Allergies Son    Heart disease Maternal Uncle    Cancer Maternal Grandmother        uterine   Cancer Maternal Grandfather        colon    Allergies as of 11/19/2021   (No Known Allergies)    Social History   Socioeconomic History   Marital status: Single    Spouse name: Not on file   Number of children: 1   Years of education: 12   Highest education level: Not on file  Occupational History   Occupation: dietary aid    Employer: [employer]  Tobacco Use   Smoking status: Never   Smokeless tobacco: Never  Vaping Use   Vaping Use: Never used  Substance and Sexual Activity   Alcohol use:  Yes    Comment: occasional   Drug use: No   Sexual activity: Yes    Partners: Male    Birth control/protection: Surgical    Comment: tubal and ablation  Other Topics Concern   Not on file  Social History Narrative   Lives with son Naida Sleight      Works in Banker at [employer] Strain: [place]  (03/21/2020)   Overall Financial Resource Strain ([place])    Difficulty of Paying Living Expenses: Not hard at all  Food Insecurity: No [place] (03/21/2020)   Hunger Vital Sign    Worried About Running Out of Food in the Last Year: Never true    [place] in the Last Year: Never true  Transportation Needs: No Transportation Needs (03/21/2020)   PRAPARE - Hydrologist (Medical): No    Lack of Transportation (Non-Medical): No  Physical Activity: Sufficiently Active (03/21/2020)   Exercise Vital Sign    Days of Exercise per Week: 6 days    Minutes of Exercise per Session: 100 min  Stress: Stress Concern Present (03/21/2020)   [place]    Feeling of Stress : To some extent  Social Connections: Socially Isolated (03/21/2020)   Social Connection and  Isolation Panel [NHANES]    Frequency of Communication with Friends and Family: More than three times a week    Frequency of Social Gatherings with Friends and Family: Never    Attends Religious Services: Never    Marine scientist or Organizations: No    Attends Archivist Meetings: Never    Marital Status: Never married  Intimate Partner Violence: Not At Risk (03/21/2020)   Humiliation, Afraid, Rape, and Kick questionnaire    Fear of Current or Ex-Partner: No    Emotionally Abused: No    Physically Abused: No    Sexually Abused: No     Review of Systems   Gen: Denies any fever, chills, fatigue, weight loss, lack of appetite.  CV: Denies chest pain, heart palpitations, peripheral edema, syncope.  Resp: Denies shortness of breath at rest or with exertion. Denies wheezing or cough.  GI: see HPI GU : Denies urinary burning, urinary frequency, urinary hesitancy MS: Denies joint pain, muscle weakness, cramps, or limitation of movement.  Derm: Denies rash, itching, dry skin Psych: Denies depression, anxiety, memory loss, and confusion Heme: Denies bruising, bleeding, and enlarged lymph nodes.   Physical Exam   BP 137/85 (BP Location: Right Arm, Patient Position: Sitting, Cuff Size: Normal)   Pulse (!) 109   Temp 99.7 F (37.6 C) (Oral)   Ht '5\' 3"'$  (1.6 m)   Wt 203 lb 3.2 oz (92.2 kg)   LMP 11/02/2021 (Exact Date)   SpO2 99%   BMI 36.00 kg/m   General:   Alert and oriented. Pleasant and cooperative. Well-nourished and well-developed.  Head:  Normocephalic and atraumatic. Eyes:  Without icterus, sclera clear and conjunctiva pink.  Ears:  Normal auditory acuity. Mouth:  No deformity or lesions, oral mucosa pink.  Lungs:  Clear to auscultation bilaterally. No wheezes, rales, or rhonchi. No distress.  Heart:  S1, S2 present without murmurs appreciated.  Abdomen:  +BS, soft, non-distended. Mild tenderness to mid lower abdomen. No HSM noted. No guarding or rebound. No  masses appreciated.  Rectal:  Large external hemorrhoids present. Pain on exam. FOBT positive.  Msk:  Symmetrical without gross deformities. Normal posture. Extremities:  Without edema. Neurologic:  Alert and  oriented x4;  grossly normal neurologically. Skin:  Intact without significant lesions or rashes. Psych:  Alert and cooperative. Normal mood and affect.   Assessment   Lenore Moyano Holzheimer is a 36 y.o. female with a history of hemorrhoids s/p surgical hemorrhoidectomy in 2018 presenting today for evaluation of iron deficiency anemia.   Iron deficiency anemia: Recent labs in September with Hgb 9.6 with microcytic and hypochromic indices. Iron 26, saturation 6% and ferritin 2. Normal B12 and folate. Does have a history of uterine fibroids and dysmenorrhea. Flex sig in 2011 with hyperemic rectal biopsies. Doe snot appear that she has had a full colonoscopy. No EGD on file. Given her labs she should undergo EGD and TCS for evaluation although likely secondary to anorectal source. No overt abdominal pain. Was proscribed oral iron but has not been taking it recently given fear of worsening constipation and in turn worsening rectal pain. Does have PICA. Advised that when she begins her Amitiza that she should take the oral iron given her extremely low ferritin and iron saturation. I advised that if she is unable to tolerate oral therapy that IV infusion would be recommended. Will schedule EGD and TCS in the near future. Will need 2 full days of clears.  Constipation: Chronic. Currently takes a stool softener and/or laxative about every 3 days. Has fear of having BM due to pain and her hemorrhoids as well as her rectal bleeding. At times she reports that after having a bowel movement she has a sharp pain that extends proximally up her rectum. States due to taste she has not tolerated miralax in the past. Has never tried anything prescription for her constipation. I advised that in order to prevent recurrence  of hemorrhoids the ultimate goal is control of constipation. Will trial Amitiza 8 mcg BID to control constipation.Advised high fiber diet and fiber supplementation.   Rectal pain/hemorrhoids/rectal bleeding: History of constipation  and underwent hemorrhoidectomy in 2018 due to large grade IV hemorrhoids. Exam difficult to discern grade IV internal hemorrhoids versus external hemorrhoids Likely a combination of both. Has been experiencing rectal bleeding with every bowel movement which is likely a contributing factor to her anemia. Unable to complete full rectal exam due to pain described as sharp and knife like. No obvious rectal mass on brief exam. POC FOBT positive. Last flex sig in 2011 with hyperemic rectal mucosa and small telangiectasia. Appears she attempted treatment with canasa suppositories but unclear if she did or If they were discontinued. Will treat for possible fissure with compounded hemorrhoid cream.   PLAN    Proceed with upper endoscopy and colonoscopy with propofol by Dr. Abbey Chatters in near future: the risks, benefits, and alternatives have been discussed with the patient in detail. The patient states understanding and desires to proceed. ASA 2 Hold iron for 10 days prior to procedure.  2 full days of clears Compounded hemorrhoid cream with lidocaine and nifedipine 0.2% from [place] TID/QID for 7-10 days Amitiza 8 mcg BID with food Benefiber 2-3 teaspoons daily. Follow up 2 months post procedures.    Venetia Night, MSN, FNP-BC, AGACNP-BC [facility]

## 2021-11-18 NOTE — H&P (View-Only) (Signed)
GI Office Note    Referring Provider: Leslie Andrea, MD Primary Care Physician:  Leslie Andrea, MD  Primary Gastroenterologist: Elon Alas. Abbey Chatters, DO  Chief Complaint   Chief Complaint  Patient presents with   Hemorrhoids    Here for abnormal liver labs, states that she also has issues with hemorrhoids. States that her gyn also told her that she had some rectal prolapse but nothing needing surgery at this time.    History of Present Illness   Hannah Osborn is a 36 y.o. female presenting today at the request of Leslie Andrea, MD for iron deficiency anemia.   Flexible sigmoidoscopy in 2011 with localized distal proctitis with small telangiectasia and small external hemorrhoids. Pathology with hyperemic colonic epithelium.   Previously seen by Dr. Oneida Alar. Last office visit in October 2018 for internal and external hemorrhoids. Reported prior hemorrhoid surgery, pain with using the bathroom and having rectal bleeding with stools. Various stool types reported Having rectal pressure and some nausea. Advised [facility] cream QID for 10 days, colace TID. Advised to follow up with Dr. Arnoldo Morale and use dicyclomine up to TID for any diarrhea.   PCP visit 09/26/21: Reported history of COVID 3 times, needed school paperwork filled out and TB test. 3 months prior reported feeling like something stuck in her throat and it closing up and referred to ENT but had not been seen. Referred to Dr. Benjamine Mola with ENT.   Labs 10/01/21: Hgb 9.6, MCV 67.7, MCH 20.8, plt 359, Iron 26, saturations 6%, ferritin 2, Vitamin B12 and folate wnl, LDL elevated, CMP unremarkable, TSH wnl. Vitamin D low at 9. Dr. Karie Kirks advised vitamin D supplementation, workup for IDA and Cologuard. (Suspected anemia due to menstruation. Advised to begin ferrous sulfate 325 mg BID.   Follow with OBGYN and has painful periods requiring prescription strength ibuprofen (800 mg). 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 Last visit noted uterine fibroids and is to think about talking with MD regarding surgical options.   Today: Does have some BRBPR that sometimes does fill up the commode and also does have fatigue. Denies melena. May sometimes have some abdominal pain that is cramping in nature. Had a uterine ablation about 5-6 years ago and now has worsening period pain after the ablation. Menstrual bleeding is labile, at times is heavier than others. Does have PICA. Does have a lack of appetite and some early satiety. Denies unintentional weight loss. Denies chest pain or shortness of breath. Does have a history of hemorrhoids and had surgery and feels like they are back. Has used preparation H in the past. States her hemorrhoids are usually on the outside.   Does have constipation for as long as she can remember. Takes a stool softener or laxative about every 3 days or so. May have a BM on her own but a lot of times she takes something for her to go. States that GYN reports she has some mild rectal prolapse.  Has discomfort with each bowel movement. Mostly just rectal pain. States she has had rectal fissures before in the past as well. Pain is similar to back then. Has been bleeding with every bowel movement and ten she is worse out as well. Is supposed to be taking oral iron but she is scared to take it due to her constipation. Even with stool softener she was having increased firmness to her stools. Has done colace and miralax and has not worked well in the past. Sometimes  after a BM she has a sensation all the way up her anal canal.   Denies upper abdominal pain. Has occasional reflux symptoms every few days, does not take anything for it. Has used tums in the past.   States she has been to hematology before in the past for blood transfusions and iron infusions and would like to do oral therapy and not IV therapy. Reports she has been through this cycle of procedures and hemorrhoid surgery and  various treatments for IDA.   Current Outpatient Medications  Medication Sig Dispense Refill   albuterol (VENTOLIN HFA) 108 (90 Base) MCG/ACT inhaler Inhale 1-2 puffs into the lungs every 6 (six) hours as needed for wheezing or shortness of breath. 18 g 0   FLOVENT HFA 110 MCG/ACT inhaler SMARTSIG:2 Puff(s) By Mouth Twice Daily     hydrOXYzine (ATARAX) 10 MG tablet Take 1 tablet (10 mg total) by mouth 3 (three) times daily as needed. 60 tablet 3   ibuprofen (ADVIL) 800 MG tablet TAKE 1 TABLET BY MOUTH EVERY 8 HOURS AS NEEDED 60 tablet 1   methocarbamol (ROBAXIN) 750 MG tablet SMARTSIG:1 Tablet(s) By Mouth 1-4 Times Daily     sertraline (ZOLOFT) 50 MG tablet Take 3 daily at once 90 tablet 3   traZODone (DESYREL) 50 MG tablet Take 1 tablet (50 mg total) by mouth at bedtime. 30 tablet 3   No current facility-administered medications for this visit.    Past Medical History:  Diagnosis Date   Allergy    Anemia    Anxiety    Anxiety and depression 03/21/2020   COVID-19    Fibroid 01/08/2016   Hypothyroid 04/03/2017   Hypothyroidism    Thyroid disease     Past Surgical History:  Procedure Laterality Date   CHOLECYSTECTOMY     DILITATION & CURRETTAGE/HYSTROSCOPY WITH NOVASURE ABLATION N/A 02/02/2016   Procedure: DILATATION & CURETTAGE/HYSTEROSCOPY WITH NOVASURE ABLATION (procedure 2);  Surgeon: Florian Buff, MD;  Location: [facility];  Service: Gynecology;  Laterality: N/A;   HEMORRHOID SURGERY N/A 05/01/2016   Procedure: EXTENSIVE HEMORRHOIDECTOMY;  Surgeon: Aviva Signs, MD;  Location: [facility];  Service: General;  Laterality: N/A;   LAPAROSCOPIC BILATERAL SALPINGECTOMY Bilateral 02/02/2016   Procedure: LAPAROSCOPIC BILATERAL SALPINGECTOMY (procedure #1);  Surgeon: Florian Buff, MD;  Location: [facility];  Service: Gynecology;  Laterality: Bilateral;   TONSILLECTOMY     TUBAL LIGATION      Family History  Problem Relation Age of Onset   Pulmonary embolism Mother    Early death Mother     Cancer Father        stomach   Early death Father    Asthma Son    Allergies Son    Heart disease Maternal Uncle    Cancer Maternal Grandmother        uterine   Cancer Maternal Grandfather        colon    Allergies as of 11/19/2021   (No Known Allergies)    Social History   Socioeconomic History   Marital status: Single    Spouse name: Not on file   Number of children: 1   Years of education: 12   Highest education level: Not on file  Occupational History   Occupation: dietary aid    Employer: [employer]  Tobacco Use   Smoking status: Never   Smokeless tobacco: Never  Vaping Use   Vaping Use: Never used  Substance and Sexual Activity   Alcohol use:  Yes    Comment: occasional   Drug use: No   Sexual activity: Yes    Partners: Male    Birth control/protection: Surgical    Comment: tubal and ablation  Other Topics Concern   Not on file  Social History Narrative   Lives with son Hannah Osborn      Works in Banker at [employer] Strain: [place]  (03/21/2020)   Overall Financial Resource Strain ([place])    Difficulty of Paying Living Expenses: Not hard at all  Food Insecurity: No [place] (03/21/2020)   Hunger Vital Sign    Worried About Running Out of Food in the Last Year: Never true    [place] in the Last Year: Never true  Transportation Needs: No Transportation Needs (03/21/2020)   PRAPARE - Hydrologist (Medical): No    Lack of Transportation (Non-Medical): No  Physical Activity: Sufficiently Active (03/21/2020)   Exercise Vital Sign    Days of Exercise per Week: 6 days    Minutes of Exercise per Session: 100 min  Stress: Stress Concern Present (03/21/2020)   [place]    Feeling of Stress : To some extent  Social Connections: Socially Isolated (03/21/2020)   Social Connection and  Isolation Panel [NHANES]    Frequency of Communication with Friends and Family: More than three times a week    Frequency of Social Gatherings with Friends and Family: Never    Attends Religious Services: Never    Marine scientist or Organizations: No    Attends Archivist Meetings: Never    Marital Status: Never married  Intimate Partner Violence: Not At Risk (03/21/2020)   Humiliation, Afraid, Rape, and Kick questionnaire    Fear of Current or Ex-Partner: No    Emotionally Abused: No    Physically Abused: No    Sexually Abused: No     Review of Systems   Gen: Denies any fever, chills, fatigue, weight loss, lack of appetite.  CV: Denies chest pain, heart palpitations, peripheral edema, syncope.  Resp: Denies shortness of breath at rest or with exertion. Denies wheezing or cough.  GI: see HPI GU : Denies urinary burning, urinary frequency, urinary hesitancy MS: Denies joint pain, muscle weakness, cramps, or limitation of movement.  Derm: Denies rash, itching, dry skin Psych: Denies depression, anxiety, memory loss, and confusion Heme: Denies bruising, bleeding, and enlarged lymph nodes.   Physical Exam   BP 137/85 (BP Location: Right Arm, Patient Position: Sitting, Cuff Size: Normal)   Pulse (!) 109   Temp 99.7 F (37.6 C) (Oral)   Ht '5\' 3"'$  (1.6 m)   Wt 203 lb 3.2 oz (92.2 kg)   LMP 11/02/2021 (Exact Date)   SpO2 99%   BMI 36.00 kg/m   General:   Alert and oriented. Pleasant and cooperative. Well-nourished and well-developed.  Head:  Normocephalic and atraumatic. Eyes:  Without icterus, sclera clear and conjunctiva pink.  Ears:  Normal auditory acuity. Mouth:  No deformity or lesions, oral mucosa pink.  Lungs:  Clear to auscultation bilaterally. No wheezes, rales, or rhonchi. No distress.  Heart:  S1, S2 present without murmurs appreciated.  Abdomen:  +BS, soft, non-distended. Mild tenderness to mid lower abdomen. No HSM noted. No guarding or rebound. No  masses appreciated.  Rectal:  Large external hemorrhoids present. Pain on exam. FOBT positive.  Msk:  Symmetrical without gross deformities. Normal posture. Extremities:  Without edema. Neurologic:  Alert and  oriented x4;  grossly normal neurologically. Skin:  Intact without significant lesions or rashes. Psych:  Alert and cooperative. Normal mood and affect.   Assessment   Hannah Osborn is a 36 y.o. female with a history of hemorrhoids s/p surgical hemorrhoidectomy in 2018 presenting today for evaluation of iron deficiency anemia.   Iron deficiency anemia: Recent labs in September with Hgb 9.6 with microcytic and hypochromic indices. Iron 26, saturation 6% and ferritin 2. Normal B12 and folate. Does have a history of uterine fibroids and dysmenorrhea. Flex sig in 2011 with hyperemic rectal biopsies. Doe snot appear that she has had a full colonoscopy. No EGD on file. Given her labs she should undergo EGD and TCS for evaluation although likely secondary to anorectal source. No overt abdominal pain. Was proscribed oral iron but has not been taking it recently given fear of worsening constipation and in turn worsening rectal pain. Does have PICA. Advised that when she begins her Amitiza that she should take the oral iron given her extremely low ferritin and iron saturation. I advised that if she is unable to tolerate oral therapy that IV infusion would be recommended. Will schedule EGD and TCS in the near future. Will need 2 full days of clears.  Constipation: Chronic. Currently takes a stool softener and/or laxative about every 3 days. Has fear of having BM due to pain and her hemorrhoids as well as her rectal bleeding. At times she reports that after having a bowel movement she has a sharp pain that extends proximally up her rectum. States due to taste she has not tolerated miralax in the past. Has never tried anything prescription for her constipation. I advised that in order to prevent recurrence  of hemorrhoids the ultimate goal is control of constipation. Will trial Amitiza 8 mcg BID to control constipation.Advised high fiber diet and fiber supplementation.   Rectal pain/hemorrhoids/rectal bleeding: History of constipation  and underwent hemorrhoidectomy in 2018 due to large grade IV hemorrhoids. Exam difficult to discern grade IV internal hemorrhoids versus external hemorrhoids Likely a combination of both. Has been experiencing rectal bleeding with every bowel movement which is likely a contributing factor to her anemia. Unable to complete full rectal exam due to pain described as sharp and knife like. No obvious rectal mass on brief exam. POC FOBT positive. Last flex sig in 2011 with hyperemic rectal mucosa and small telangiectasia. Appears she attempted treatment with canasa suppositories but unclear if she did or If they were discontinued. Will treat for possible fissure with compounded hemorrhoid cream.   PLAN    Proceed with upper endoscopy and colonoscopy with propofol by Dr. Abbey Chatters in near future: the risks, benefits, and alternatives have been discussed with the patient in detail. The patient states understanding and desires to proceed. ASA 2 Hold iron for 10 days prior to procedure.  2 full days of clears Compounded hemorrhoid cream with lidocaine and nifedipine 0.2% from [place] TID/QID for 7-10 days Amitiza 8 mcg BID with food Benefiber 2-3 teaspoons daily. Follow up 2 months post procedures.    Venetia Night, MSN, FNP-BC, AGACNP-BC [facility]

## 2021-11-19 ENCOUNTER — Encounter: Payer: Self-pay | Admitting: Gastroenterology

## 2021-11-19 ENCOUNTER — Ambulatory Visit (INDEPENDENT_AMBULATORY_CARE_PROVIDER_SITE_OTHER): Payer: BC Managed Care – PPO | Admitting: Gastroenterology

## 2021-11-19 VITALS — BP 137/85 | HR 109 | Temp 99.7°F | Ht 63.0 in | Wt 203.2 lb

## 2021-11-19 DIAGNOSIS — K625 Hemorrhage of anus and rectum: Secondary | ICD-10-CM

## 2021-11-19 DIAGNOSIS — K5904 Chronic idiopathic constipation: Secondary | ICD-10-CM

## 2021-11-19 DIAGNOSIS — D509 Iron deficiency anemia, unspecified: Secondary | ICD-10-CM | POA: Diagnosis not present

## 2021-11-19 DIAGNOSIS — K6289 Other specified diseases of anus and rectum: Secondary | ICD-10-CM | POA: Diagnosis not present

## 2021-11-19 DIAGNOSIS — K649 Unspecified hemorrhoids: Secondary | ICD-10-CM

## 2021-11-19 MED ORDER — LUBIPROSTONE 8 MCG PO CAPS: 8.0000 ug | ORAL_CAPSULE | Freq: Two times a day (BID) | ORAL | 1 refills | Status: DC

## 2021-11-19 NOTE — Patient Instructions (Addendum)
When you start your Amitiza you will take 8 mcg twice daily with food to avoid nausea.  When you begin your Amitiza which you to resume your iron.  Until you are able to obtain your Amitiza, please continue daily stool softeners and high-fiber diet.  Supplementation with Benefiber would also be beneficial.  You may take 2-3 teaspoons daily.  I will call in a compounded hemorrhoid cream to [facility].  You will need to apply this 3-4 times daily for 1 week then  as needed for your hemorrhoids.  We will schedule you for an upper endoscopy and colonoscopy to further evaluate your anemia and your rectal bleeding.  You will need to hold your iron for 10 days prior to your procedure.  Follow-up 2 months post procedure.  It was a pleasure to see you today. I want to create trusting relationships with patients. If you receive a survey regarding your visit,  I greatly appreciate you taking time to fill this out on paper or through your MyChart. I value your feedback.  Venetia Night, MSN, FNP-BC, AGACNP-BC [facility]

## 2021-11-20 ENCOUNTER — Telehealth: Payer: Self-pay | Admitting: *Deleted

## 2021-11-20 ENCOUNTER — Encounter: Payer: Self-pay | Admitting: *Deleted

## 2021-11-20 MED ORDER — PEG 3350-KCL-NA BICARB-NACL 420 G PO SOLR: 4000.0000 mL | Freq: Once | ORAL | 0 refills | Status: DC

## 2021-11-20 NOTE — Telephone Encounter (Signed)
Pt has been scheduled for 11/29/21 at 2:15 pm. Instructions sent to pt via MyChart. Prep sent in to the pharmacy

## 2021-11-20 NOTE — Telephone Encounter (Signed)
LMOVM to call back to schedule TCS/EGD w/ propofol asa 2. 2 days of clears, hold iron x 10 days, linzess 128mg x 4 days prior of not already on [place]

## 2021-11-27 MED ORDER — PEG 3350-KCL-NA BICARB-NACL 420 G PO SOLR: 4000.0000 mL | Freq: Once | ORAL | 0 refills | Status: AC

## 2021-11-28 ENCOUNTER — Other Ambulatory Visit: Payer: Self-pay | Admitting: *Deleted

## 2021-11-28 ENCOUNTER — Other Ambulatory Visit (HOSPITAL_COMMUNITY)
Admission: RE | Admit: 2021-11-28 | Discharge: 2021-11-28 | Disposition: A | Discharge status: Home / Self Care | Payer: BC Managed Care – PPO | Source: Ambulatory Visit | Attending: Internal Medicine | Admitting: Internal Medicine

## 2021-11-28 DIAGNOSIS — Z6835 Body mass index (BMI) 35.0-35.9, adult: Secondary | ICD-10-CM | POA: Diagnosis not present

## 2021-11-28 DIAGNOSIS — K625 Hemorrhage of anus and rectum: Secondary | ICD-10-CM

## 2021-11-28 DIAGNOSIS — K644 Residual hemorrhoidal skin tags: Secondary | ICD-10-CM | POA: Diagnosis not present

## 2021-11-28 DIAGNOSIS — F5089 Other specified eating disorder: Secondary | ICD-10-CM | POA: Diagnosis not present

## 2021-11-28 DIAGNOSIS — K623 Rectal prolapse: Secondary | ICD-10-CM | POA: Diagnosis not present

## 2021-11-28 DIAGNOSIS — K2950 Unspecified chronic gastritis without bleeding: Secondary | ICD-10-CM | POA: Diagnosis not present

## 2021-11-28 DIAGNOSIS — K648 Other hemorrhoids: Secondary | ICD-10-CM | POA: Diagnosis not present

## 2021-11-28 DIAGNOSIS — N946 Dysmenorrhea, unspecified: Secondary | ICD-10-CM | POA: Diagnosis not present

## 2021-11-28 DIAGNOSIS — D509 Iron deficiency anemia, unspecified: Secondary | ICD-10-CM

## 2021-11-28 DIAGNOSIS — Z8616 Personal history of COVID-19: Secondary | ICD-10-CM | POA: Diagnosis not present

## 2021-11-28 DIAGNOSIS — E039 Hypothyroidism, unspecified: Secondary | ICD-10-CM | POA: Diagnosis not present

## 2021-11-28 DIAGNOSIS — K5521 Angiodysplasia of colon with hemorrhage: Secondary | ICD-10-CM | POA: Diagnosis present

## 2021-11-28 DIAGNOSIS — K5909 Other constipation: Secondary | ICD-10-CM | POA: Diagnosis not present

## 2021-11-28 LAB — PREGNANCY, URINE: Preg Test, Ur: NEGATIVE

## 2021-11-29 ENCOUNTER — Ambulatory Visit (HOSPITAL_COMMUNITY): Payer: BC Managed Care – PPO | Admitting: Anesthesiology

## 2021-11-29 ENCOUNTER — Other Ambulatory Visit: Payer: Self-pay

## 2021-11-29 ENCOUNTER — Encounter (HOSPITAL_COMMUNITY): Payer: Self-pay

## 2021-11-29 ENCOUNTER — Ambulatory Visit (HOSPITAL_COMMUNITY)
Admission: RE | Admit: 2021-11-29 | Discharge: 2021-11-29 | Disposition: A | Discharge status: Home / Self Care | Payer: BC Managed Care – PPO | Attending: Internal Medicine | Admitting: Internal Medicine

## 2021-11-29 ENCOUNTER — Encounter (HOSPITAL_COMMUNITY)
Admission: RE | Disposition: A | Discharge status: Home / Self Care | Payer: Self-pay | Source: Home / Self Care | Attending: Internal Medicine

## 2021-11-29 DIAGNOSIS — K2950 Unspecified chronic gastritis without bleeding: Secondary | ICD-10-CM | POA: Insufficient documentation

## 2021-11-29 DIAGNOSIS — K5909 Other constipation: Secondary | ICD-10-CM | POA: Insufficient documentation

## 2021-11-29 DIAGNOSIS — E039 Hypothyroidism, unspecified: Secondary | ICD-10-CM | POA: Insufficient documentation

## 2021-11-29 DIAGNOSIS — K5520 Angiodysplasia of colon without hemorrhage: Secondary | ICD-10-CM

## 2021-11-29 DIAGNOSIS — K5521 Angiodysplasia of colon with hemorrhage: Secondary | ICD-10-CM | POA: Insufficient documentation

## 2021-11-29 DIAGNOSIS — K2970 Gastritis, unspecified, without bleeding: Secondary | ICD-10-CM

## 2021-11-29 DIAGNOSIS — K644 Residual hemorrhoidal skin tags: Secondary | ICD-10-CM

## 2021-11-29 DIAGNOSIS — D509 Iron deficiency anemia, unspecified: Secondary | ICD-10-CM | POA: Insufficient documentation

## 2021-11-29 DIAGNOSIS — D649 Anemia, unspecified: Secondary | ICD-10-CM

## 2021-11-29 DIAGNOSIS — Z6835 Body mass index (BMI) 35.0-35.9, adult: Secondary | ICD-10-CM | POA: Insufficient documentation

## 2021-11-29 DIAGNOSIS — K623 Rectal prolapse: Secondary | ICD-10-CM | POA: Insufficient documentation

## 2021-11-29 DIAGNOSIS — K5900 Constipation, unspecified: Secondary | ICD-10-CM

## 2021-11-29 DIAGNOSIS — F5089 Other specified eating disorder: Secondary | ICD-10-CM | POA: Insufficient documentation

## 2021-11-29 DIAGNOSIS — N946 Dysmenorrhea, unspecified: Secondary | ICD-10-CM | POA: Insufficient documentation

## 2021-11-29 DIAGNOSIS — Z8616 Personal history of COVID-19: Secondary | ICD-10-CM | POA: Insufficient documentation

## 2021-11-29 DIAGNOSIS — K648 Other hemorrhoids: Secondary | ICD-10-CM | POA: Insufficient documentation

## 2021-11-29 HISTORY — PX: COLONOSCOPY WITH PROPOFOL: SHX5780

## 2021-11-29 HISTORY — PX: BIOPSY: SHX5522

## 2021-11-29 HISTORY — PX: ESOPHAGOGASTRODUODENOSCOPY (EGD) WITH PROPOFOL: SHX5813

## 2021-11-29 SURGERY — COLONOSCOPY WITH PROPOFOL
Anesthesia: General

## 2021-11-29 MED ORDER — PROPOFOL 10 MG/ML IV BOLUS
INTRAVENOUS | Status: DC | PRN
  Administered 2021-11-29: 100 mg via INTRAVENOUS
  Administered 2021-11-29: 50 mg via INTRAVENOUS

## 2021-11-29 MED ORDER — PROPOFOL 500 MG/50ML IV EMUL
INTRAVENOUS | Status: DC | PRN
  Administered 2021-11-29: 150 ug/kg/min via INTRAVENOUS

## 2021-11-29 MED ORDER — DEXMEDETOMIDINE HCL IN NACL 80 MCG/20ML IV SOLN
INTRAVENOUS | Status: DC | PRN
  Administered 2021-11-29: 20 ug via INTRAVENOUS

## 2021-11-29 MED ORDER — LIDOCAINE HCL (CARDIAC) PF 100 MG/5ML IV SOSY
PREFILLED_SYRINGE | INTRAVENOUS | Status: DC | PRN
  Administered 2021-11-29: 100 mg via INTRAVENOUS

## 2021-11-29 MED ORDER — LACTATED RINGERS IV SOLN: INTRAVENOUS | Status: DC

## 2021-11-29 NOTE — Op Note (Signed)
[facility] Patient Name: Hannah Osborn Procedure Date: 11/29/2021 12:39 PM MRN: [id number] Date of Birth: 1986-01-20 Attending MD: Hannah Osborn , [place], [id number] CSN: [id number] Age: 36 Admit Type: Outpatient Procedure:                Colonoscopy Indications:              Rectal bleeding, Iron deficiency anemia Providers:                Hannah Osborn, Janeece Riggers, RN, Everardo Pacific Referring MD:              Medicines:                See the Anesthesia note for documentation of the                            administered medications Complications:            No immediate complications. Estimated Blood Loss:     Estimated blood loss: none. Procedure:                Pre-Anesthesia Assessment:                           - The anesthesia plan was to use monitored                            anesthesia care (MAC).                           After obtaining informed consent, the colonoscope                            was passed under direct vision. Throughout the                            procedure, the patient's blood pressure, pulse, and                            oxygen saturations were monitored continuously. The                            [id number]-HQ190L ([id number]) scope was introduced through                            the anus and advanced to the the cecum, identified                            by appendiceal orifice and ileocecal valve. The                            colonoscopy was performed without difficulty. The                            patient tolerated the procedure well. The quality  of the bowel preparation was evaluated using the                            BBPS [facility] Bowel Preparation Scale) with scores                            of: Right Colon = 3, Transverse Colon = 3 and Left                            Colon = 3 (entire mucosa seen well with no residual                            staining, small  fragments of stool or opaque                            liquid). The total BBPS score equals 9. Scope In: 12:56:26 PM Scope Out: 1:14:05 PM Scope Withdrawal Time: 0 hours 11 minutes 56 seconds  Total Procedure Duration: 0 hours 17 minutes 39 seconds  Findings:      Hemorrhoids were found on perianal exam.      Non-bleeding internal hemorrhoids were found during retroflexion.      Three small angiodysplastic lesions without bleeding were found in the       descending colon and in the transverse colon. Coagulation for tissue       destruction using argon plasma was successful.      The exam was otherwise without abnormality. Impression:               - Hemorrhoids found on perianal exam.                           - Non-bleeding internal hemorrhoids.                           - Three non-bleeding colonic angiodysplastic                            lesions. Treated with argon plasma coagulation                            (APC).                           - The examination was otherwise normal.                           - No specimens collected. Moderate Sedation:      Per Anesthesia Care Recommendation:           - Patient has a contact number available for                            emergencies. The signs and symptoms of potential                            delayed complications were discussed with the  patient. Return to normal activities tomorrow.                            Written discharge instructions were provided to the                            patient.                           - Resume previous diet.                           - Continue present medications.                           - Repeat colonoscopy in 10 years for screening                            purposes.                           - Return to GI clinic in 3 months.                           - Consider hemorrhoid banding. Procedure Code(s):        --- Professional ---                            [phone], Colonoscopy, flexible; with ablation of                            tumor(s), polyp(s), or other lesion(s) (includes                            pre- and post-dilation and guide wire passage, when                            performed) Diagnosis Code(s):        --- Professional ---                           K55.20, Angiodysplasia of colon without hemorrhage                           K64.8, Other hemorrhoids                           K62.5, Hemorrhage of anus and rectum                           D50.9, Iron deficiency anemia, unspecified CPT copyright 2022 American Medical Association. All rights reserved. The codes documented in this report are preliminary and upon coder review may  be revised to meet current compliance requirements. Hannah Osborn Hannah Osborn 11/29/2021 1:18:07 PM This report has been signed electronically. Number of Addenda: 0

## 2021-11-29 NOTE — Anesthesia Postprocedure Evaluation (Signed)
Anesthesia Post Note  Patient: Suezette R Tupy  Procedure(s) Performed: COLONOSCOPY WITH PROPOFOL ESOPHAGOGASTRODUODENOSCOPY (EGD) WITH PROPOFOL BIOPSY  Patient location during evaluation: Phase II Anesthesia Type: General Level of consciousness: awake and alert and oriented Pain management: pain level controlled Vital Signs Assessment: post-procedure vital signs reviewed and stable Respiratory status: spontaneous breathing, nonlabored ventilation and respiratory function stable Cardiovascular status: blood pressure returned to baseline and stable Postop Assessment: no apparent nausea or vomiting Anesthetic complications: no  No notable events documented.   Last Vitals:  Vitals:   11/29/21 1318 11/29/21 1323  BP: (!) 89/48 102/66  Pulse: 77 76  Resp: 17 18  Temp: 36.5 C   SpO2: 96% 97%    Last Pain:  Vitals:   11/29/21 1323  TempSrc:   PainSc: 0-No pain                 Kathleen Likins C Cailee Blanke

## 2021-11-29 NOTE — Interval H&P Note (Signed)
History and Physical Interval Note:  11/29/2021 12:36 PM  Hannah Osborn  has presented today for surgery, with the diagnosis of hemmorhoids,constipation,anemia.  The various methods of treatment have been discussed with the patient and family. After consideration of risks, benefits and other options for treatment, the patient has consented to  Procedure(s) with comments: COLONOSCOPY WITH PROPOFOL (N/A) - 2:15 pm, pt knows to arrive at 10:15 ESOPHAGOGASTRODUODENOSCOPY (EGD) WITH PROPOFOL (N/A) as a surgical intervention.  The patient's history has been reviewed, patient examined, no change in status, stable for surgery.  I have reviewed the patient's chart and labs.  Questions were answered to the patient's satisfaction.     Eloise Harman

## 2021-11-29 NOTE — Transfer of Care (Signed)
Immediate Anesthesia Transfer of Care Note  Patient: Hannah Osborn  Procedure(s) Performed: COLONOSCOPY WITH PROPOFOL ESOPHAGOGASTRODUODENOSCOPY (EGD) WITH PROPOFOL BIOPSY  Patient Location: Short Stay  Anesthesia Type:General  Level of Consciousness: awake, alert , oriented, and patient cooperative  Airway & Oxygen Therapy: Patient Spontanous Breathing  Post-op Assessment: Report given to RN, Post -op Vital signs reviewed and stable, and Patient moving all extremities X 4  Post vital signs: Reviewed and stable  Last Vitals:  Vitals Value Taken Time  BP 89/48 11/29/21 1318  Temp 36.5 C 11/29/21 1318  Pulse 77 11/29/21 1318  Resp 17 11/29/21 1318  SpO2 96 % 11/29/21 1318    Last Pain:  Vitals:   11/29/21 1318  TempSrc: Oral  PainSc: 0-No pain      Patients Stated Pain Goal: 10 (82/95/62 1308)  Complications: No notable events documented.

## 2021-11-29 NOTE — Anesthesia Preprocedure Evaluation (Addendum)
Anesthesia Evaluation  Patient identified by MRN, date of birth, ID band Patient awake    Reviewed: Allergy & Precautions, H&P , NPO status , Patient's Chart, lab work & pertinent test results  Airway Mallampati: II  TM Distance: >3 FB Neck ROM: Full    Dental  (+) Dental Advisory Given   Pulmonary neg pulmonary ROS   Pulmonary exam normal breath sounds clear to auscultation       Cardiovascular negative cardio ROS Normal cardiovascular exam Rhythm:Regular Rate:Normal     Neuro/Psych  PSYCHIATRIC DISORDERS Anxiety Depression    negative neurological ROS     GI/Hepatic negative GI ROS, Neg liver ROS,,,  Endo/Other  Hypothyroidism (not on meds)    Renal/GU negative Renal ROS  negative genitourinary   Musculoskeletal negative musculoskeletal ROS (+)    Abdominal   Peds negative pediatric ROS (+)  Hematology  (+) Blood dyscrasia, anemia   Anesthesia Other Findings   Reproductive/Obstetrics negative OB ROS                             Anesthesia Physical Anesthesia Plan  ASA: 2  Anesthesia Plan: General   Post-op Pain Management: Minimal or no pain anticipated   Induction: Intravenous  PONV Risk Score and Plan: Propofol infusion  Airway Management Planned: Nasal Cannula and Natural Airway  Additional Equipment:   Intra-op Plan:   Post-operative Plan:   Informed Consent: I have reviewed the patients History and Physical, chart, labs and discussed the procedure including the risks, benefits and alternatives for the proposed anesthesia with the patient or authorized representative who has indicated his/her understanding and acceptance.     Dental advisory given  Plan Discussed with: CRNA and Surgeon  Anesthesia Plan Comments:        Anesthesia Quick Evaluation

## 2021-11-29 NOTE — Discharge Instructions (Addendum)
EGD Discharge instructions Please read the instructions outlined below and refer to this sheet in the next few weeks. These discharge instructions provide you with general information on caring for yourself after you leave the hospital. Your doctor may also give you specific instructions. While your treatment has been planned according to the most current medical practices available, unavoidable complications occasionally occur. If you have any problems or questions after discharge, please call your doctor. ACTIVITY You may resume your regular activity but move at a slower pace for the next 24 hours.  Take frequent rest periods for the next 24 hours.  Walking will help expel (get rid of) the air and reduce the bloated feeling in your abdomen.  No driving for 24 hours (because of the anesthesia (medicine) used during the test).  You may shower.  Do not sign any important legal documents or operate any machinery for 24 hours (because of the anesthesia used during the test).  NUTRITION Drink plenty of fluids.  You may resume your normal diet.  Begin with a light meal and progress to your normal diet.  Avoid alcoholic beverages for 24 hours or as instructed by your caregiver.  MEDICATIONS You may resume your normal medications unless your caregiver tells you otherwise.  WHAT YOU CAN EXPECT TODAY You may experience abdominal discomfort such as a feeling of fullness or "gas" pains.  FOLLOW-UP Your doctor will discuss the results of your test with you.  SEEK IMMEDIATE MEDICAL ATTENTION IF ANY OF THE FOLLOWING OCCUR: Excessive nausea (feeling sick to your stomach) and/or vomiting.  Severe abdominal pain and distention (swelling).  Trouble swallowing.  Temperature over 101 F (37.8 C).  Rectal bleeding or vomiting of blood.     Colonoscopy Discharge Instructions  Read the instructions outlined below and refer to this sheet in the next few weeks. These discharge instructions provide you with  general information on caring for yourself after you leave the hospital. Your doctor may also give you specific instructions. While your treatment has been planned according to the most current medical practices available, unavoidable complications occasionally occur.   ACTIVITY You may resume your regular activity, but move at a slower pace for the next 24 hours.  Take frequent rest periods for the next 24 hours.  Walking will help get rid of the air and reduce the bloated feeling in your belly (abdomen).  No driving for 24 hours (because of the medicine (anesthesia) used during the test).   Do not sign any important legal documents or operate any machinery for 24 hours (because of the anesthesia used during the test).  NUTRITION Drink plenty of fluids.  You may resume your normal diet as instructed by your doctor.  Begin with a light meal and progress to your normal diet. Heavy or fried foods are harder to digest and may make you feel sick to your stomach (nauseated).  Avoid alcoholic beverages for 24 hours or as instructed.  MEDICATIONS You may resume your normal medications unless your doctor tells you otherwise.  WHAT YOU CAN EXPECT TODAY Some feelings of bloating in the abdomen.  Passage of more gas than usual.  Spotting of blood in your stool or on the toilet paper.  IF YOU HAD POLYPS REMOVED DURING THE COLONOSCOPY: No aspirin products for 7 days or as instructed.  No alcohol for 7 days or as instructed.  Eat a soft diet for the next 24 hours.  FINDING OUT THE RESULTS OF YOUR TEST Not all test results are  available during your visit. If your test results are not back during the visit, make an appointment with your caregiver to find out the results. Do not assume everything is normal if you have not heard from your caregiver or the medical facility. It is important for you to follow up on all of your test results.  SEEK IMMEDIATE MEDICAL ATTENTION IF: You have more than a spotting of  blood in your stool.  Your belly is swollen (abdominal distention).  You are nauseated or vomiting.  You have a temperature over 101.  You have abdominal pain or discomfort that is severe or gets worse throughout the day.   Your EGD revealed mild amount inflammation in your stomach.  I took biopsies of this to rule out infection with a bacteria called H. pylori.  Await pathology results, my office will contact you.  Your esophagus and small bowel appeared normal.  Your colonoscopy did not reveal any polyps or evidence of colon cancer.  Repeat colonoscopy 10 years for screening purposes.  You did have 3 lesions called angiodysplasias or AVMs for short. These can ooze blood over time.  I treated these with coagulation with good success.  You do have large internal hemorrhoids.  We can consider banding in our clinic.  Follow-up with GI in 2 to 3 months.  I hope you have a great rest of your week!  Elon Alas. Abbey Chatters, D.O. Gastroenterology and Hepatology [facility]

## 2021-11-29 NOTE — Op Note (Signed)
[facility] Patient Name: Hannah Osborn Procedure Date: 11/29/2021 12:38 PM MRN: [id number] Date of Birth: 14-Aug-1985 Attending MD: Elon Alas. Abbey Chatters , [place], [id number] CSN: [id number] Age: 36 Admit Type: Outpatient Procedure:                Upper GI endoscopy Indications:              Iron deficiency anemia Providers:                Elon Alas. Abbey Chatters, DO, Janeece Riggers, RN, Everardo Pacific Referring MD:              Medicines:                See the Anesthesia note for documentation of the                            administered medications Complications:            No immediate complications. Estimated Blood Loss:     Estimated blood loss was minimal. Procedure:                Pre-Anesthesia Assessment:                           - The anesthesia plan was to use monitored                            anesthesia care (MAC).                           After obtaining informed consent, the endoscope was                            passed under direct vision. Throughout the                            procedure, the patient's blood pressure, pulse, and                            oxygen saturations were monitored continuously. The                            [id number]-H190 ([id number]) scope was introduced through the                            mouth, and advanced to the second part of duodenum.                            The upper GI endoscopy was accomplished without                            difficulty. The patient tolerated the procedure                            well. Scope In: 01:74:94 PM  Scope Out: 12:50:04 PM Total Procedure Duration: 0 hours 3 minutes 2 seconds  Findings:      There is no endoscopic evidence of bleeding, areas of erosion,       esophagitis, ulcerations or varices in the entire esophagus.      Patchy mild inflammation characterized by erythema was found in the       gastric fundus. Biopsies were taken with a cold forceps for Helicobacter        pylori testing.      The duodenal bulb, first portion of the duodenum and second portion of       the duodenum were normal. Impression:               - Gastritis. Biopsied.                           - Normal duodenal bulb, first portion of the                            duodenum and second portion of the duodenum. Moderate Sedation:      Per Anesthesia Care Recommendation:           - Patient has a contact number available for                            emergencies. The signs and symptoms of potential                            delayed complications were discussed with the                            patient. Return to normal activities tomorrow.                            Written discharge instructions were provided to the                            patient.                           - Resume previous diet.                           - Continue present medications.                           - Await pathology results.                           - Return to GI clinic in 3 months. Procedure Code(s):        --- Professional ---                           [phone], Esophagogastroduodenoscopy, flexible,                            transoral; with biopsy, single or multiple Diagnosis Code(s):        --- Professional ---  K29.70, Gastritis, unspecified, without bleeding                           D50.9, Iron deficiency anemia, unspecified CPT copyright 2022 [employer]. All rights reserved. The codes documented in this report are preliminary and upon coder review may  be revised to meet current compliance requirements. Elon Alas. Abbey Chatters, DO [place] Abbey Chatters, DO 11/29/2021 12:54:47 PM This report has been signed electronically. Number of Addenda: 0

## 2021-11-30 LAB — SURGICAL PATHOLOGY

## 2021-12-05 ENCOUNTER — Encounter (HOSPITAL_COMMUNITY): Payer: Self-pay | Admitting: Internal Medicine

## 2022-01-19 ENCOUNTER — Other Ambulatory Visit: Payer: Self-pay

## 2022-01-19 ENCOUNTER — Emergency Department (HOSPITAL_COMMUNITY)
Admission: EM | Admit: 2022-01-19 | Discharge: 2022-01-19 | Disposition: A | Discharge status: Home / Self Care | Payer: BC Managed Care – PPO | Attending: Emergency Medicine | Admitting: Emergency Medicine

## 2022-01-19 DIAGNOSIS — H6692 Otitis media, unspecified, left ear: Secondary | ICD-10-CM

## 2022-01-19 DIAGNOSIS — R509 Fever, unspecified: Secondary | ICD-10-CM | POA: Diagnosis present

## 2022-01-19 DIAGNOSIS — Z20822 Contact with and (suspected) exposure to covid-19: Secondary | ICD-10-CM | POA: Insufficient documentation

## 2022-01-19 LAB — RESP PANEL BY RT-PCR (RSV, FLU A&B, COVID)  RVPGX2
Influenza A by PCR: NEGATIVE
Influenza B by PCR: NEGATIVE
Resp Syncytial Virus by PCR: NEGATIVE
SARS Coronavirus 2 by RT PCR: NEGATIVE

## 2022-01-19 LAB — GROUP A STREP BY PCR: Group A Strep by PCR: NOT DETECTED

## 2022-01-19 MED ORDER — AMOXICILLIN 250 MG PO CAPS
500.0000 mg | ORAL_CAPSULE | Freq: Once | ORAL | Status: AC
  Administered 2022-01-19: 500 mg via ORAL
  Filled 2022-01-19: qty 2

## 2022-01-19 MED ORDER — AMOXICILLIN 500 MG PO CAPS: 500.0000 mg | ORAL_CAPSULE | Freq: Three times a day (TID) | ORAL | 0 refills | Status: DC

## 2022-01-19 NOTE — ED Triage Notes (Signed)
Cough, congestion, sore throat x 1 week  and left ear pain x 2 days

## 2022-01-19 NOTE — Discharge Instructions (Addendum)
Return if any problems.  See your Physician for recheck in 1 week   °

## 2022-01-19 NOTE — ED Provider Notes (Signed)
[facility] EMERGENCY DEPARTMENT Provider Note   CSN: [id number] Arrival date & time: 01/19/22  1905     History  Chief Complaint  Patient presents with   Cough   Sore Throat   Otalgia    Hannah Osborn is a 36 y.o. female.  Pt complains of a left earache and congestion. Pt reports she has had a sore throat for a week.  She has had ear pain for 2 days   The history is provided by the patient. No language interpreter was used.  Cough Cough characteristics:  Non-productive Sputum characteristics:  Nondescript Severity:  Moderate Onset quality:  Gradual Timing:  Constant Progression:  Worsening Chronicity:  New Smoker: no   Relieved by:  Nothing Worsened by:  Nothing Ineffective treatments:  None tried Associated symptoms: ear pain   Sore Throat  Otalgia Associated symptoms: cough        Home Medications Prior to Admission medications   Medication Sig Start Date End Date Taking? Authorizing Provider  albuterol (VENTOLIN HFA) 108 (90 Base) MCG/ACT inhaler Inhale 1-2 puffs into the lungs every 6 (six) hours as needed for wheezing or shortness of breath. 06/22/21   Volney American, PA-C  ibuprofen (ADVIL) 800 MG tablet TAKE 1 TABLET BY MOUTH EVERY 8 HOURS AS NEEDED 10/09/21   Estill Dooms, NP      Allergies    Patient has no known allergies.    Review of Systems   Review of Systems  HENT:  Positive for ear pain.   Respiratory:  Positive for cough.   All other systems reviewed and are negative.   Physical Exam Updated Vital Signs BP 138/82   Pulse (!) 107   Temp 98.8 F (37.1 C) (Oral)   Resp 18   Ht '5\' 3"'$  (1.6 m)   Wt 88.9 kg   LMP 01/19/2022 (Exact Date)   SpO2 100%   BMI 34.72 kg/m  Physical Exam Vitals and nursing note reviewed.  Constitutional:      Appearance: She is well-developed.  HENT:     Head: Normocephalic.     Right Ear: Tympanic membrane normal.     Left Ear: Tympanic membrane is erythematous.     Mouth/Throat:      Mouth: Mucous membranes are moist.  Eyes:     Conjunctiva/sclera: Conjunctivae normal.  Cardiovascular:     Rate and Rhythm: Normal rate.  Pulmonary:     Effort: Pulmonary effort is normal.  Abdominal:     General: There is no distension.  Musculoskeletal:        General: Normal range of motion.     Cervical back: Normal range of motion.  Neurological:     Mental Status: She is alert and oriented to person, place, and time.  Psychiatric:        Mood and Affect: Mood normal.     ED Results / Procedures / Treatments   Labs (all labs ordered are listed, but only abnormal results are displayed) Labs Reviewed  RESP PANEL BY RT-PCR (RSV, FLU A&B, COVID)  [id number]  GROUP A STREP BY PCR    EKG None  Radiology No results found.  Procedures Procedures    Medications Ordered in ED Medications - No data to display  ED Course/ Medical Decision Making/ A&P                           Medical Decision Making Pt complains of a sore  throat for the past week.  Pt reports ear pain for 2 days   Amount and/or Complexity of Data Reviewed Labs: ordered. Decision-making details documented in ED Course.    Details: Labs ordered reviewed and interpreted, strep, covid and influenza are negative   Risk Prescription drug management. Risk Details: Pt counsled on Otitis media.  Rx for amoxicillian            Final Clinical Impression(s) / ED Diagnoses Final diagnoses:  Left otitis media, unspecified otitis media type    Rx / DC Orders ED Discharge Orders          Ordered    amoxicillin (AMOXIL) 500 MG capsule  3 times daily        01/19/22 2142          An After Visit Summary was printed and given to the patient.     Fransico Meadow, PA-C 01/19/22 2142    Audley Hose, MD 01/19/22 [phone]

## 2022-01-29 ENCOUNTER — Ambulatory Visit: Payer: BC Managed Care – PPO | Admitting: Gastroenterology

## 2022-03-14 ENCOUNTER — Ambulatory Visit (INDEPENDENT_AMBULATORY_CARE_PROVIDER_SITE_OTHER): Payer: BC Managed Care – PPO | Admitting: Gastroenterology

## 2022-03-14 ENCOUNTER — Encounter: Payer: Self-pay | Admitting: Gastroenterology

## 2022-03-14 VITALS — BP 110/75 | HR 85 | Temp 98.1°F | Ht 63.0 in | Wt 199.8 lb

## 2022-03-14 DIAGNOSIS — D509 Iron deficiency anemia, unspecified: Secondary | ICD-10-CM

## 2022-03-14 DIAGNOSIS — K2970 Gastritis, unspecified, without bleeding: Secondary | ICD-10-CM

## 2022-03-14 DIAGNOSIS — K649 Unspecified hemorrhoids: Secondary | ICD-10-CM

## 2022-03-14 DIAGNOSIS — K5904 Chronic idiopathic constipation: Secondary | ICD-10-CM

## 2022-03-14 MED ORDER — LUBIPROSTONE 24 MCG PO CAPS: 24.0000 ug | ORAL_CAPSULE | Freq: Two times a day (BID) | ORAL | 3 refills | Status: DC

## 2022-03-14 NOTE — Progress Notes (Signed)
GI Office Note    Referring Provider: Leslie Andrea, MD Primary Care Physician:  Leslie Andrea, MD Primary Gastroenterologist: Elon Alas. Abbey Chatters, DO  Date:  03/14/2022  ID:  [place], DOB 1985-05-15, MRN [id number]   Chief Complaint   Chief Complaint  Patient presents with   Follow-up    History of Present Illness  Hannah Osborn is a 37 y.o. female with a history of hemorrhoids s/p surgical hemorrhoidectomy in 2018, and iron deficiency anemia presenting today for follow-up  Flexible sigmoidoscopy in 2011 with localized distal proctitis with small telangiectasia and small external hemorrhoids. Pathology with hyperemic colonic epithelium.    Previously seen by Dr. Oneida Alar. Last office visit in October 2018 for internal and external hemorrhoids. Reported prior hemorrhoid surgery, pain with using the bathroom and having rectal bleeding with stools. Various stool types reported Having rectal pressure and some nausea. [facility] QID for 10 days, colace TID. Advised to follow up with Dr. Arnoldo Morale and use dicyclomine up to TID for any diarrhea.    PCP visit 09/26/21: Reported history of COVID 3 times, needed school paperwork filled out and TB test. 3 months prior reported feeling like something stuck in her throat and it closing up and referred to ENT but had not been seen. Referred to Dr. Benjamine Mola with ENT.    Labs 10/01/21: Hgb 9.6, MCV 67.7, MCH 20.8, plt 359, Iron 26, saturations 6%, ferritin 2, Vitamin B12 and folate wnl, LDL elevated, CMP unremarkable, TSH wnl. Vitamin D low at 9. Dr. Karie Kirks advised vitamin D supplementation, workup for IDA and Cologuard. (Suspected anemia due to menstruation. Advised to begin ferrous sulfate 325 mg BID.    Follow with OBGYN and has painful periods requiring prescription strength ibuprofen (800 mg). 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 Last visit noted uterine fibroids and is to think about talking  with MD regarding surgical options.   Last office visit 10/23/2021. Notes BRBPR and also having fatigue.  Denied any melena.  Does have occasional abdominal cramping.  Denies pica.  Did report worsening pain with menstrual cycle since ablation.  States hemorrhoids are usually on the outside, using Preparation H in the past and noted history of hemorrhoid surgery.  Chronic constipation taking stool softener and laxative every 3 days or so.  May have a BM overall less edematous to take something.  Reports some possible mild rectal prolapse by GYN and having discomfort with most bowel movements.  Noted history of rectal fissures in the past.  Had failed Colace and MiraLAX combo in the past.  Denied any upper abdominal pain but did report some occasional reflux symptoms had seen hematology in the past for iron infusions and blood transfusions.  Scheduled for EGD and colonoscopy.  Advised Amitiza 8 mcg twice daily with food and daily fiber.  Compounded [employer] hemorrhoid cream called in.  EGD 11/29/2021: -Gastritis s/p biopsy -Normal duodenum -Normal esophagus -Biopsy with focal chronic gastritis, negative for H. Pylori -Advised to consider PPI therapy  Colonoscopy 11/29/2021: -Hemorrhoids on perianal exam -Nonbleeding internal hemorrhoids -Small angiodysplastic lesions without bleeding found in the descending colon and transverse colon s/p APC therapy -Advise repeat colonoscopy in 10 years -Consider hemorrhoid banding.  Today: Feeling okay. Taking Amitiza 8 mcg twice daily and still needing an otc laxative with it. Going every 3 days even with medication. On the weekends she feels better. Tries not to spend long time on the commode. Sometimes she has to hold her  breath to push stool out. States since she was a young child she has had issues with using the bathroom and needed to get suppositories.   Since the AVMs were ablated her rectal pain ins improved. Still has some but not as bad as  before. Still having some bleeding with her bowel movements. Thinks the bleeding may be a little better than prior. Sees the blood at the end of her finishing a BM and there when she wipes. Compounded hemorrhoid cream burned when she used it. Does not use it when she needs to use it to see if it will numb it. Has lidocaine spray to use for her hemorrhoids.   Not really having reflux symptoms but is not constant.   Has been in school the past 5 months and working. Thinks she ran herself until she got sick. Having lots of congestion. Report she has had COVID 3 times.     Current Outpatient Medications  Medication Sig Dispense Refill   albuterol (VENTOLIN HFA) 108 (90 Base) MCG/ACT inhaler Inhale 1-2 puffs into the lungs every 6 (six) hours as needed for wheezing or shortness of breath. 18 g 0   ALPRAZolam (XANAX) 0.5 MG tablet Take 0.5 mg by mouth daily.     ibuprofen (ADVIL) 800 MG tablet TAKE 1 TABLET BY MOUTH EVERY 8 HOURS AS NEEDED 60 tablet 1   lubiprostone (AMITIZA) 24 MCG capsule Take 1 capsule (24 mcg total) by mouth 2 (two) times daily with a meal. 60 capsule 3   methocarbamol (ROBAXIN) 750 MG tablet Take 750 mg by mouth every 6 (six) hours as needed.     amoxicillin (AMOXIL) 500 MG capsule Take 1 capsule (500 mg total) by mouth 3 (three) times daily. (Patient not taking: Reported on 03/14/2022) 30 capsule 0   No current facility-administered medications for this visit.    Past Medical History:  Diagnosis Date   Allergy    Anemia    Anxiety    Anxiety and depression 03/21/2020   COVID-19    Fibroid 01/08/2016   Hypothyroid 04/03/2017   Hypothyroidism    Thyroid disease     Past Surgical History:  Procedure Laterality Date   BIOPSY  11/29/2021   Procedure: BIOPSY;  Surgeon: Eloise Harman, DO;  [facility];  Service: Endoscopy;;   CHOLECYSTECTOMY     COLONOSCOPY WITH PROPOFOL N/A 11/29/2021   Procedure: COLONOSCOPY WITH PROPOFOL;  Surgeon: Eloise Harman, DO;   [facility];  Service: Endoscopy;  Laterality: N/A;  2:15 pm, pt knows to arrive at 10:15   [place] N/A 02/02/2016   Procedure: DILATATION & CURETTAGE/HYSTEROSCOPY WITH NOVASURE ABLATION (procedure 2);  Surgeon: Florian Buff, MD;  Location: [facility];  Service: Gynecology;  Laterality: N/A;   ESOPHAGOGASTRODUODENOSCOPY (EGD) WITH PROPOFOL N/A 11/29/2021   Procedure: ESOPHAGOGASTRODUODENOSCOPY (EGD) WITH PROPOFOL;  Surgeon: Eloise Harman, DO;  [facility];  Service: Endoscopy;  Laterality: N/A;   HEMORRHOID SURGERY N/A 05/01/2016   Procedure: EXTENSIVE HEMORRHOIDECTOMY;  Surgeon: Aviva Signs, MD;  Location: [facility];  Service: General;  Laterality: N/A;   LAPAROSCOPIC BILATERAL SALPINGECTOMY Bilateral 02/02/2016   Procedure: LAPAROSCOPIC BILATERAL SALPINGECTOMY (procedure #1);  Surgeon: Florian Buff, MD;  Location: [facility];  Service: Gynecology;  Laterality: Bilateral;   TONSILLECTOMY     TUBAL LIGATION      Family History  Problem Relation Age of Onset   Pulmonary embolism Mother    Early death  Mother    Cancer Father        stomach   Early death Father    Asthma Son    Allergies Son    Heart disease Maternal Uncle    Cancer Maternal Grandmother        uterine   Cancer Maternal Grandfather        colon    Allergies as of 03/14/2022   (No Known Allergies)    Social History   Socioeconomic History   Marital status: Single    Spouse name: Not on file   Number of children: 1   Years of education: 12   Highest education level: Not on file  Occupational History   Occupation: dietary aid    Employer: [employer]  Tobacco Use   Smoking status: Never   Smokeless tobacco: Never  Vaping Use   Vaping Use: Never used  Substance and Sexual Activity   Alcohol use: Yes    Comment: occasional   Drug use: No   Sexual activity: Yes    Partners: Male    Birth control/protection: Surgical     Comment: tubal and ablation  Other Topics Concern   Not on file  Social History Narrative   Lives with son Naida Sleight      Works in Banker at [employer] Strain: [place]  (03/21/2020)   Overall Financial Resource Strain ([place])    Difficulty of Paying Living Expenses: Not hard at all  Food Insecurity: No [place] (03/21/2020)   Hunger Vital Sign    Worried About Running Out of Food in the Last Year: Never true    [place] in the Last Year: Never true  Transportation Needs: No Transportation Needs (03/21/2020)   PRAPARE - Hydrologist (Medical): No    Lack of Transportation (Non-Medical): No  Physical Activity: Sufficiently Active (03/21/2020)   Exercise Vital Sign    Days of Exercise per Week: 6 days    Minutes of Exercise per Session: 100 min  Stress: Stress Concern Present (03/21/2020)   [place]    Feeling of Stress : To some extent  Social Connections: Socially Isolated (03/21/2020)   Social Connection and Isolation Panel [NHANES]    Frequency of Communication with Friends and Family: More than three times a week    Frequency of Social Gatherings with Friends and Family: Never    Attends Religious Services: Never    Marine scientist or Organizations: No    Attends Archivist Meetings: Never    Marital Status: Never married     Review of Systems   Gen: Denies fever, chills, anorexia. Denies fatigue, weakness, weight loss.  CV: Denies chest pain, palpitations, syncope, peripheral edema, and claudication. Resp: Denies dyspnea at rest, cough, wheezing, coughing up blood, and pleurisy. GI: See HPI Derm: Denies rash, itching, dry skin Psych: Denies depression, anxiety, memory loss, confusion. No homicidal or suicidal ideation.  Heme: Denies bruising, bleeding, and enlarged lymph nodes.   Physical  Exam   BP 110/75 (BP Location: Right Arm, Patient Position: Sitting, Cuff Size: Normal)   Pulse 85   Temp 98.1 F (36.7 C) (Temporal)   Ht '5\' 3"'$  (1.6 m)   Wt 199 lb 12.8 oz (90.6 kg)   LMP 03/14/2022 (Approximate)   SpO2 99%   BMI 35.39 kg/m  General:   Alert and oriented. No distress noted. Pleasant and cooperative.  Head:  Normocephalic and atraumatic. Eyes:  Conjuctiva clear without scleral icterus. Mouth:  Oral mucosa pink and moist. Good dentition. No lesions. Lungs:  Clear to auscultation bilaterally. No wheezes, rales, or rhonchi. No distress.  Heart:  S1, S2 present without murmurs appreciated.  Abdomen:  +BS, soft, non-tender and non-distended. No rebound or guarding. No HSM or masses noted. Rectal: deferred Msk:  Symmetrical without gross deformities. Normal posture. Extremities:  Without edema. Neurologic:  Alert and  oriented x4 Psych:  Alert and cooperative. Normal mood and affect.   Assessment  Ettie P[place]on Hast is a 37 y.o. female with a history of hemorrhoids s/p surgical hemorrhoidectomy in 2018, and iron deficiency anemia presenting today for follow-up.  Iron deficiency anemia: Hgb 9.6 in September. Has not tolerated oral iron in the past and has received iron infusions in the past. Still with some bleeding with Bms but not as much as previous. EGD and Colonoscopy up to date. Small AVMs found in the descending and transverse colon s/p APC therapy. Residual bleeding likely secondary to hemorrhoids. Will check Hgb and iron panel to assess for improvement. Has not tolerated oral iron due to worsening constipation in the past and has received iron infusions in the past as well. Pending results may need to consider givens capsule for complete workup given evidence of colonic AVMs.   Constipation: Chronic. Has had some improvement with Amitiza 8 mcg but still is having to use otc laxative every 3 days to have better BM. Still is having to strain at times. Does better on the  weekends. Colonoscopy as outlined above. Will increase Amitiza to 56mg BID and continue stool softener as needed.   Gastritis: EGD in November 32023 with gastritis with focal chronic gastritis, negative for H. Pylori. Offered PPI therapy however given infrequent heartburn she would like to avoid additional medications.  GERD diet reinforced.   Hemorrhoids: Chronic. Hemorrhoidectomy in 2018. Has external and internal hemorrhoids and intermittent rectal bleeding.  Given uncontrolled constipation her hemorrhoid reoccurred. Previously given compounded hemorrhoid cream with nifedipine at last visit. Reports burning at times with application so I have advised her to use rectal lidocaine as needed for pain/discomfort. Goal to improve constipation. Discussed hemorrhoid banding for internal hemorrhoids and provided with educational pamphlet.   PLAN   Amitiza 24 mcg BID Use stool softener as needed CBC and iron panel Lidocaine rectally as needed GERD diet/lifestyle modifications Provide hemorrhoid banding pamphlet Follow up in 4 months.     CVenetia Night MSN, FNP-BC, AGACNP-BC [facility]

## 2022-03-14 NOTE — Patient Instructions (Addendum)
CBC and iron panel.  I have sent in Amitiza 24 mcg for you to take twice daily. You may use a stool softener with this If you wish.   Continue to use hemorrhoid cream as needed. May use spray lidocaine prior to using it. You can also use the internal rectal lidocaine cream as needed for discomfort.   Please review hemorrhoid banding pamphlet.   Follow a GERD diet:  Avoid fried, fatty, greasy, spicy, citrus foods. Avoid caffeine and carbonated beverages. Avoid chocolate. Try eating 4-6 small meals a day rather than 3 large meals. Do not eat within 3 hours of laying down. Prop head of bed up on wood or bricks to create a 6 inch incline.  I am ordering labs for you to have completed at your earliest convenience.   It was a pleasure to see you today. I want to create trusting relationships with patients. If you receive a survey regarding your visit,  I greatly appreciate you taking time to fill this out on paper or through your MyChart. I value your feedback.  Venetia Night, MSN, FNP-BC, AGACNP-BC [facility]

## 2022-03-16 LAB — CBC
Hematocrit: 31.2 % — ABNORMAL LOW (ref 34.0–46.6)
Hemoglobin: 9.1 g/dL — ABNORMAL LOW (ref 11.1–15.9)
MCH: 18.9 pg — ABNORMAL LOW (ref 26.6–33.0)
MCHC: 29.2 g/dL — ABNORMAL LOW (ref 31.5–35.7)
MCV: 65 fL — ABNORMAL LOW (ref 79–97)
Platelets: 404 10*3/uL (ref 150–450)
RBC: 4.82 x10E6/uL (ref 3.77–5.28)
RDW: 18 % — ABNORMAL HIGH (ref 11.7–15.4)
WBC: 5.3 10*3/uL (ref 3.4–10.8)

## 2022-03-16 LAB — IRON,TIBC AND FERRITIN PANEL
Ferritin: 4 ng/mL — ABNORMAL LOW (ref 15–150)
Iron Saturation: 4 % — CL (ref 15–55)
Iron: 18 ug/dL — ABNORMAL LOW (ref 27–159)
Total Iron Binding Capacity: 418 ug/dL (ref 250–450)
UIBC: 400 ug/dL (ref 131–425)

## 2022-03-27 ENCOUNTER — Other Ambulatory Visit (HOSPITAL_COMMUNITY): Payer: Self-pay | Admitting: Family Medicine

## 2022-03-27 DIAGNOSIS — M545 Low back pain, unspecified: Secondary | ICD-10-CM

## 2022-03-31 ENCOUNTER — Other Ambulatory Visit: Payer: Self-pay | Admitting: Adult Health

## 2022-04-02 ENCOUNTER — Encounter: Payer: Self-pay | Admitting: Obstetrics & Gynecology

## 2022-04-02 ENCOUNTER — Ambulatory Visit: Payer: BC Managed Care – PPO | Admitting: Obstetrics & Gynecology

## 2022-04-02 VITALS — BP 118/80 | HR 96 | Ht 63.0 in | Wt 202.0 lb

## 2022-04-02 DIAGNOSIS — N946 Dysmenorrhea, unspecified: Secondary | ICD-10-CM | POA: Diagnosis not present

## 2022-04-02 DIAGNOSIS — D500 Iron deficiency anemia secondary to blood loss (chronic): Secondary | ICD-10-CM

## 2022-04-02 DIAGNOSIS — D219 Benign neoplasm of connective and other soft tissue, unspecified: Secondary | ICD-10-CM | POA: Diagnosis not present

## 2022-04-02 DIAGNOSIS — N920 Excessive and frequent menstruation with regular cycle: Secondary | ICD-10-CM

## 2022-04-02 MED ORDER — MEGESTROL ACETATE 40 MG PO TABS: ORAL_TABLET | ORAL | 3 refills | Status: DC

## 2022-04-02 MED ORDER — POLYETHYLENE GLYCOL 3350 17 GM/SCOOP PO POWD: ORAL | 11 refills | Status: AC

## 2022-04-02 NOTE — Progress Notes (Signed)
Follow up appointment for results: sonogram  Chief Complaint  Patient presents with   Pre-op Exam    Wants to talk about getting a hysterectomy    Blood pressure 118/80, pulse 96, height '5\' 3"'$  (1.6 m), weight 202 lb (91.6 kg), last menstrual period 03/14/2022.  CLINICAL DATA:  Pelvic pain, R 10.2, BILATERAL salpingectomy 2018, LMP 03/20/2021   EXAM: TRANSABDOMINAL AND TRANSVAGINAL ULTRASOUND OF PELVIS   TECHNIQUE: Both transabdominal and transvaginal ultrasound examinations of the pelvis were performed. Transabdominal technique was performed for global imaging of the pelvis including uterus, ovaries, adnexal regions, and pelvic cul-de-sac. It was necessary to proceed with endovaginal exam following the transabdominal exam to visualize the uterus, endometrium, and ovaries.   COMPARISON:  04/23/2017   FINDINGS: Uterus   Measurements: 9.2 x 5.2 x 4.9 cm = volume: 122 mL. Anteverted. Heterogeneous myometrium. Exophytic leiomyoma posterior mid uterus 4.6 x 4.8 x 6.4 cm. No additional discrete masses.   Endometrium   Thickness: 2 mm.  No endometrial fluid or mass   Right ovary   Measurements: 2.1 x 2.8 x 2.0 cm = volume: 6.1 mL. Normal morphology without mass   Left ovary   Measurements: 3.1 x 1.9 x 3.2 cm = volume: 9.8 mL. Normal morphology without mass   Other findings   No free pelvic fluid. No adnexal masses. Visualized portions of urinary bladder unremarkable.   IMPRESSION: Exophytic leiomyoma at posterior mid uterus 6.4 cm greatest size.   Otherwise negative exam.     Electronically Signed   By: Lavonia Dana M.D.   On: 03/23/2021 09:56  Recent Results (from the past 2160 hour(s))  Resp panel by RT-PCR (RSV, Flu A&B, Covid) Anterior Nasal Swab     Status: None   Collection Time: 01/19/22  7:29 PM   Specimen: Anterior Nasal Swab  Result Value Ref Range   SARS Coronavirus 2 by RT PCR NEGATIVE NEGATIVE    Comment: (NOTE) SARS-CoV-2 target nucleic  acids are NOT DETECTED.  The SARS-CoV-2 RNA is generally detectable in upper respiratory specimens during the acute phase of infection. The lowest concentration of SARS-CoV-2 viral copies this assay can detect is 138 copies/mL. A negative result does not preclude SARS-Cov-2 infection and should not be used as the sole basis for treatment or other patient management decisions. A negative result may occur with  improper specimen collection/handling, submission of specimen other than nasopharyngeal swab, presence of viral mutation(s) within the areas targeted by this assay, and inadequate number of viral copies(<138 copies/mL). A negative result must be combined with clinical observations, patient history, and epidemiological information. The expected result is Negative.  Fact Sheet for Patients:  EntrepreneurPulse.com.au  Fact Sheet for Healthcare Providers:  IncredibleEmployment.be  This test is no t yet approved or cleared by the [place] FDA and  has been authorized for detection and/or diagnosis of SARS-CoV-2 by FDA under an Emergency Use Authorization (EUA). This EUA will remain  in effect (meaning this test can be used) for the duration of the COVID-19 declaration under Section 564(b)(1) of the Act, 21 U.S.C.section 360bbb-3(b)(1), unless the authorization is terminated  or revoked sooner.       Influenza A by PCR NEGATIVE NEGATIVE   Influenza B by PCR NEGATIVE NEGATIVE    Comment: (NOTE) The Xpert Xpress SARS-CoV-2/FLU/RSV plus assay is intended as an aid in the diagnosis of influenza from Nasopharyngeal swab specimens and should not be used as a sole basis for treatment. Nasal washings and aspirates are unacceptable  for Xpert Xpress SARS-CoV-2/FLU/RSV testing.  Fact Sheet for Patients: EntrepreneurPulse.com.au  Fact Sheet for Healthcare Providers: IncredibleEmployment.be  This test is not  yet approved or cleared by the [place] FDA and has been authorized for detection and/or diagnosis of SARS-CoV-2 by FDA under an Emergency Use Authorization (EUA). This EUA will remain in effect (meaning this test can be used) for the duration of the COVID-19 declaration under Section 564(b)(1) of the Act, 21 U.S.C. section 360bbb-3(b)(1), unless the authorization is terminated or revoked.     Resp Syncytial Virus by PCR NEGATIVE NEGATIVE    Comment: (NOTE) Fact Sheet for Patients: EntrepreneurPulse.com.au  Fact Sheet for Healthcare Providers: IncredibleEmployment.be  This test is not yet approved or cleared by the [place] FDA and has been authorized for detection and/or diagnosis of SARS-CoV-2 by FDA under an Emergency Use Authorization (EUA). This EUA will remain in effect (meaning this test can be used) for the duration of the COVID-19 declaration under Section 564(b)(1) of the Act, 21 U.S.C. section 360bbb-3(b)(1), unless the authorization is terminated or revoked.  Performed at [facility], [address], [place], [place] [address]   Group A Strep by PCR     Status: None   Collection Time: 01/19/22  7:29 PM   Specimen: Throat; Sterile Swab  Result Value Ref Range   Group A Strep by PCR NOT DETECTED NOT DETECTED    Comment: Performed at [facility], [address], [place], [place] [address]  CBC     Status: Abnormal   Collection Time: 03/15/22 11:34 AM  Result Value Ref Range   WBC 5.3 3.4 - 10.8 x10E3/uL   RBC 4.82 3.77 - 5.28 x10E6/uL   Hemoglobin 9.1 (L) 11.1 - 15.9 g/dL   Hematocrit 31.2 (L) 34.0 - 46.6 %   MCV 65 (L) 79 - 97 fL   MCH 18.9 (L) 26.6 - 33.0 pg   MCHC 29.2 (L) 31.5 - 35.7 g/dL   RDW 18.0 (H) 11.7 - 15.4 %   Platelets 404 150 - 450 x10E3/uL  Fe+TIBC+Fer     Status: Abnormal   Collection Time: 03/15/22 11:34 AM  Result Value Ref Range   Total Iron Binding Capacity 418 250 - 450 ug/dL   UIBC 400 131 -  425 ug/dL   Iron 18 (L) 27 - 159 ug/dL   Iron Saturation 4 (LL) 15 - 55 %   Ferritin 4 (L) 15 - 150 ng/mL     MEDS ordered this encounter: Meds ordered this encounter  Medications   polyethylene glycol powder (GLYCOLAX/MIRALAX) 17 GM/SCOOP powder    Sig: 1 scoop daily or as needed    Dispense:  255 g    Refill:  11   megestrol (MEGACE) 40 MG tablet    Sig: 3 tablets a day for 5 days, 2 tablets a day for 5 days then 1 tablet daily    Dispense:  45 tablet    Refill:  3    Orders for this encounter: No orders of the defined types were placed in this encounter.   Impression + Management Plan   ICD-10-CM   1. Menorrhagia with regular cycle  N92.0     2. Fibroid  D21.9     3. Dysmenorrhea  N94.6     4. Iron deficiency anemia due to chronic blood loss  D50.0       Follow Up: Return in about 3 months (around 07/12/2022) for Post Op, with Dr Elonda Husky.     All  questions were answered.  Past Medical History:  Diagnosis Date   Allergy    Anemia    Anxiety    Anxiety and depression 03/21/2020   COVID-19    Fibroid 01/08/2016   Hypothyroid 04/03/2017   Hypothyroidism    Screen for STD (sexually transmitted disease) 10/26/2020   Thyroid disease     Past Surgical History:  Procedure Laterality Date   BIOPSY  11/29/2021   Procedure: BIOPSY;  Surgeon: Eloise Harman, DO;  [facility];  Service: Endoscopy;;   CHOLECYSTECTOMY     COLONOSCOPY WITH PROPOFOL N/A 11/29/2021   Procedure: COLONOSCOPY WITH PROPOFOL;  Surgeon: Eloise Harman, DO;  [facility];  Service: Endoscopy;  Laterality: N/A;  2:15 pm, pt knows to arrive at 10:15   [place] N/A 02/02/2016   Procedure: DILATATION & CURETTAGE/HYSTEROSCOPY WITH NOVASURE ABLATION (procedure 2);  Surgeon: Florian Buff, MD;  [facility];  Service: Gynecology;  Laterality: N/A;   ESOPHAGOGASTRODUODENOSCOPY (EGD) WITH PROPOFOL N/A 11/29/2021   Procedure:  ESOPHAGOGASTRODUODENOSCOPY (EGD) WITH PROPOFOL;  Surgeon: Eloise Harman, DO;  [facility];  Service: Endoscopy;  Laterality: N/A;   HEMORRHOID SURGERY N/A 05/01/2016   Procedure: EXTENSIVE HEMORRHOIDECTOMY;  Surgeon: Aviva Signs, MD;  Location: [facility];  Service: General;  Laterality: N/A;   LAPAROSCOPIC BILATERAL SALPINGECTOMY Bilateral 02/02/2016   Procedure: LAPAROSCOPIC BILATERAL SALPINGECTOMY (procedure #1);  Surgeon: Florian Buff, MD;  Location: [facility];  Service: Gynecology;  Laterality: Bilateral;   TONSILLECTOMY     TUBAL LIGATION      OB History     Gravida  1   Para  1   Term  1   Preterm      AB      Living  1      SAB      IAB      Ectopic      Multiple      Live Births  1           No Known Allergies  Social History   Socioeconomic History   Marital status: Single    Spouse name: Not on file   Number of children: 1   Years of education: 12   Highest education level: Not on file  Occupational History   Occupation: dietary aid    Employer: [employer]  Tobacco Use   Smoking status: Never   Smokeless tobacco: Never  Vaping Use   Vaping Use: Never used  Substance and Sexual Activity   Alcohol use: Yes    Comment: occasional   Drug use: No   Sexual activity: Yes    Partners: Male    Birth control/protection: Surgical    Comment: tubal and ablation  Other Topics Concern   Not on file  Social History Narrative   Lives with son Naida Sleight      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: [place]  (03/21/2020)   Overall Financial Resource Strain (CARDIA)    Difficulty of Paying Living Expenses: Not hard at all  Food Insecurity: No [place] (03/21/2020)   Hunger Vital Sign    Worried About Running Out of Food in the Last Year: Never true    [place] in the Last Year: Never true  Transportation Needs: No Transportation Needs (03/21/2020)    PRAPARE - Transportation  Lack of Transportation (Medical): No    Lack of Transportation (Non-Medical): No  Physical Activity: Sufficiently Active (03/21/2020)   Exercise Vital Sign    Days of Exercise per Week: 6 days    Minutes of Exercise per Session: 100 min  Stress: Stress Concern Present (03/21/2020)   [place]    Feeling of Stress : To some extent  Social Connections: Socially Isolated (03/21/2020)   Social Connection and Isolation Panel [NHANES]    Frequency of Communication with Friends and Family: More than three times a week    Frequency of Social Gatherings with Friends and Family: Never    Attends Religious Services: Never    Marine scientist or Organizations: No    Attends Music therapist: Never    Marital Status: Never married    Family History  Problem Relation Age of Onset   Pulmonary embolism Mother    Early death Mother    Cancer Father        stomach   Early death Father    Asthma Son    Allergies Son    Heart disease Maternal Uncle    Cancer Maternal Grandmother        uterine   Cancer Maternal Grandfather        colon

## 2022-04-03 ENCOUNTER — Encounter: Payer: Self-pay | Admitting: Obstetrics & Gynecology

## 2022-04-23 ENCOUNTER — Ambulatory Visit: Payer: BC Managed Care – PPO | Admitting: Obstetrics & Gynecology

## 2022-04-30 ENCOUNTER — Encounter (HOSPITAL_COMMUNITY): Payer: Self-pay | Admitting: Oncology

## 2022-05-06 ENCOUNTER — Encounter: Payer: Self-pay | Admitting: Obstetrics & Gynecology

## 2022-05-06 MED ORDER — MEGESTROL ACETATE 40 MG PO TABS: 120.0000 mg | ORAL_TABLET | Freq: Every day | ORAL | 1 refills | Status: DC

## 2022-05-09 ENCOUNTER — Encounter: Payer: Self-pay | Admitting: Obstetrics & Gynecology

## 2022-05-13 ENCOUNTER — Other Ambulatory Visit (INDEPENDENT_AMBULATORY_CARE_PROVIDER_SITE_OTHER): Payer: BC Managed Care – PPO

## 2022-05-13 ENCOUNTER — Encounter: Payer: Self-pay | Admitting: Obstetrics & Gynecology

## 2022-05-13 DIAGNOSIS — Z8742 Personal history of other diseases of the female genital tract: Secondary | ICD-10-CM | POA: Diagnosis not present

## 2022-05-13 LAB — POCT HEMOGLOBIN: Hemoglobin: 12.2 g/dL (ref 11–14.6)

## 2022-05-13 NOTE — Progress Notes (Signed)
   NURSE VISIT- Hemoglobin check  SUBJECTIVE:  Hannah Osborn is a 37 y.o. G1P1001 GYN patient female here for a POCT hemoglobin check per Dr Despina Hidden due to heavy vaginal bleeding.   OBJECTIVE:  There were no vitals taken for this visit.  Appears well, in no apparent distress  ASSESSMENT: HCG 12.2  PLAN: Follow-up as needed if symptoms persist/worsen, or new symptoms develop Proceed with surgery as scheduled.  Debbe Odea Muscab Brenneman  05/13/2022 9:49 AM

## 2022-05-20 ENCOUNTER — Ambulatory Visit (HOSPITAL_COMMUNITY)
Admission: RE | Admit: 2022-05-20 | Discharge: 2022-05-20 | Disposition: A | Discharge status: Home / Self Care | Payer: BC Managed Care – PPO | Source: Ambulatory Visit | Attending: Family Medicine | Admitting: Family Medicine

## 2022-05-20 ENCOUNTER — Encounter: Payer: Self-pay | Admitting: Obstetrics & Gynecology

## 2022-05-20 DIAGNOSIS — M545 Low back pain, unspecified: Secondary | ICD-10-CM | POA: Insufficient documentation

## 2022-05-22 ENCOUNTER — Encounter (HOSPITAL_COMMUNITY): Payer: Self-pay | Admitting: Oncology

## 2022-06-13 ENCOUNTER — Encounter: Payer: Self-pay | Admitting: Gastroenterology

## 2022-06-20 IMAGING — US US PELVIS COMPLETE WITH TRANSVAGINAL
2 series · 13 of 25 positions shown · non-contrast
Comparison: 04/23/2017

CLINICAL DATA: Pelvic pain, R 10.2, BILATERAL salpingectomy 0648,
LMP 03/20/2021



[Series 1: gyn us · 12 of 89 slices shown (1 of 2)]
[im 1/89]
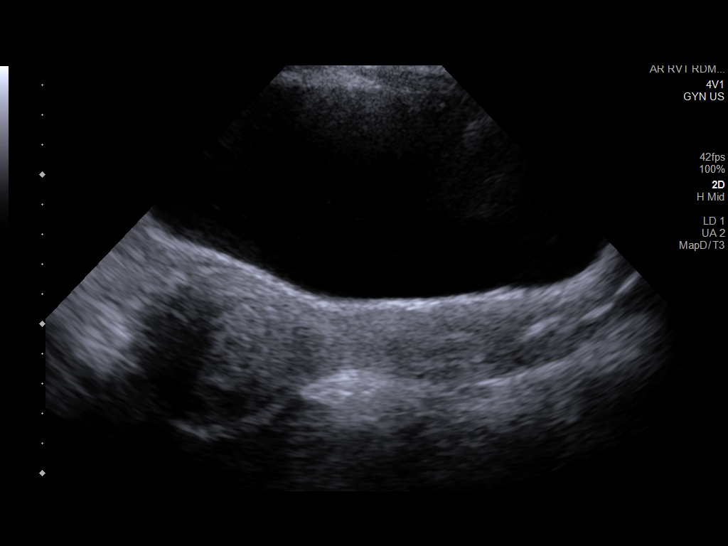
[im 8/89]
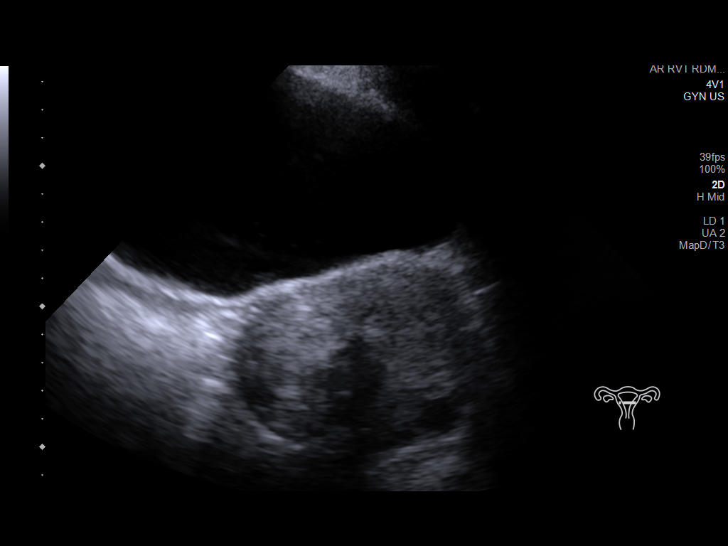
[im 16/89]
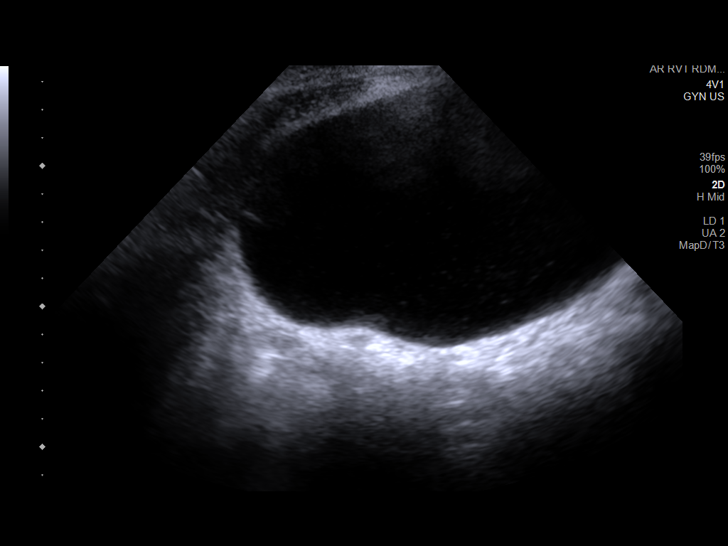
[im 23/89]
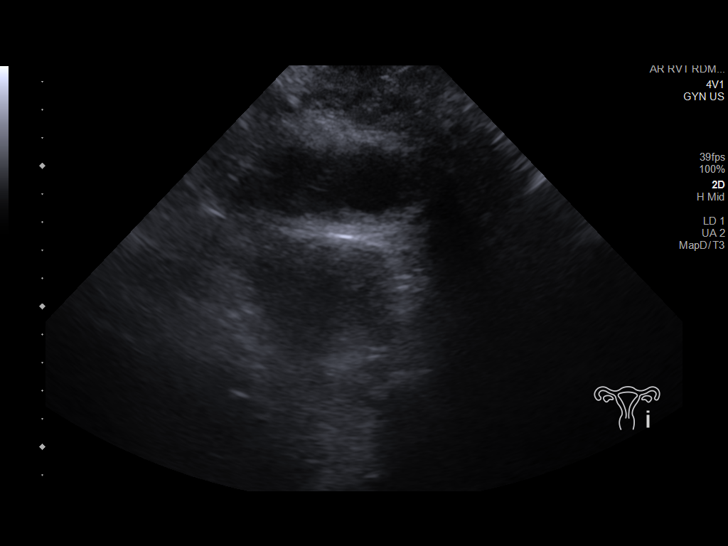
[im 31/89]
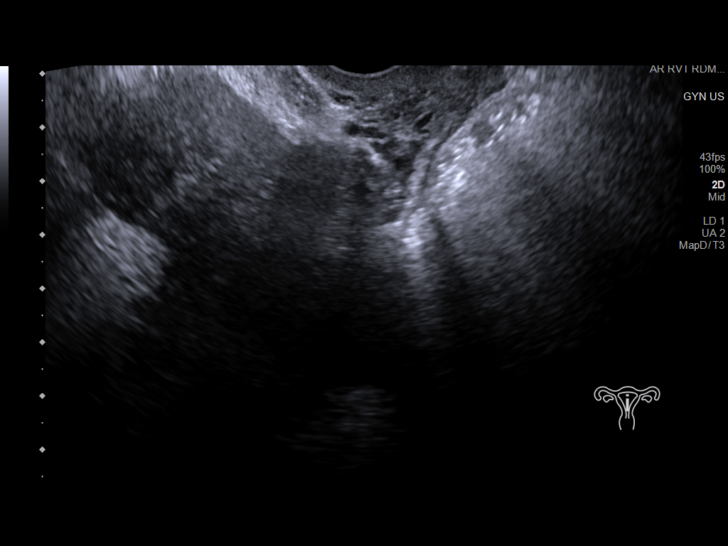
[im 39/89]
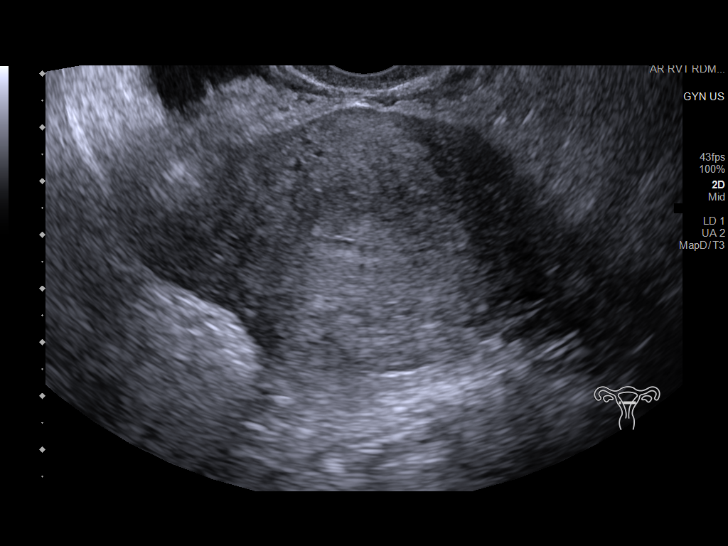
[im 46/89]
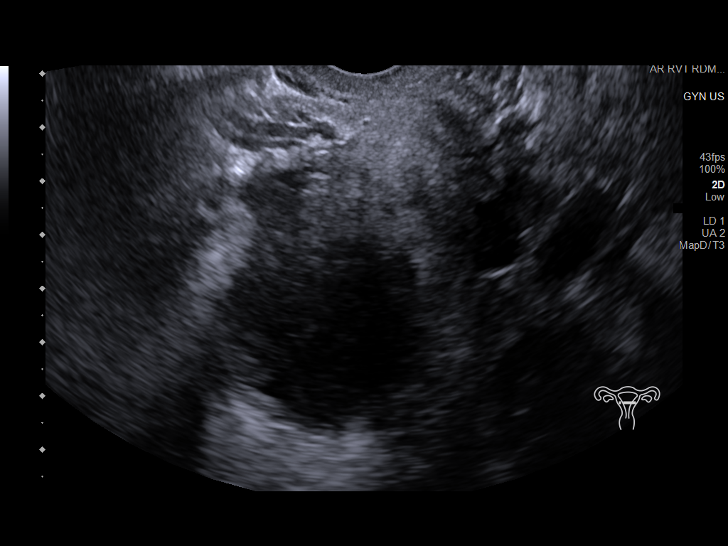
[im 54/89]
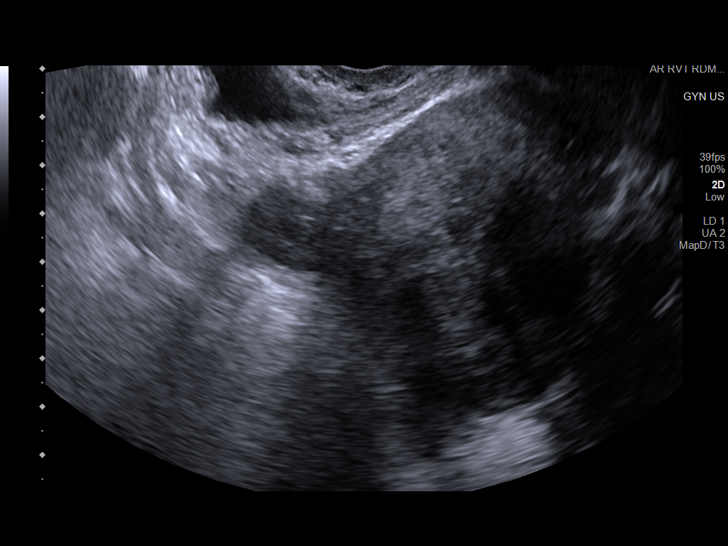
[im 62/89]
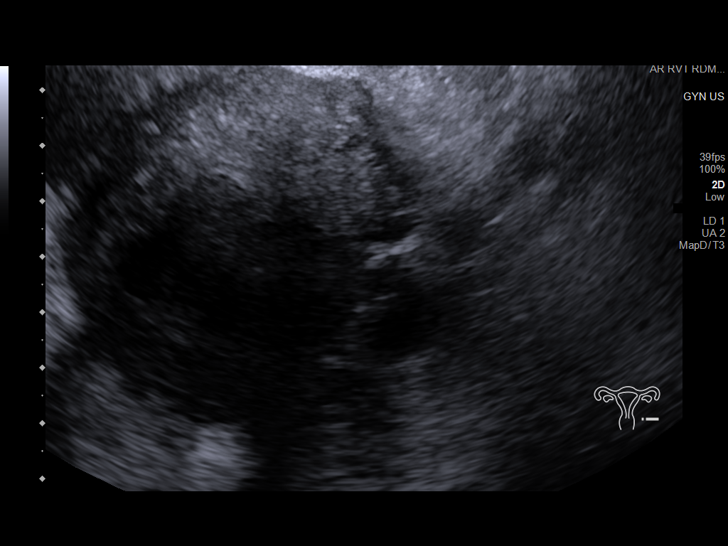
[im 69/89]
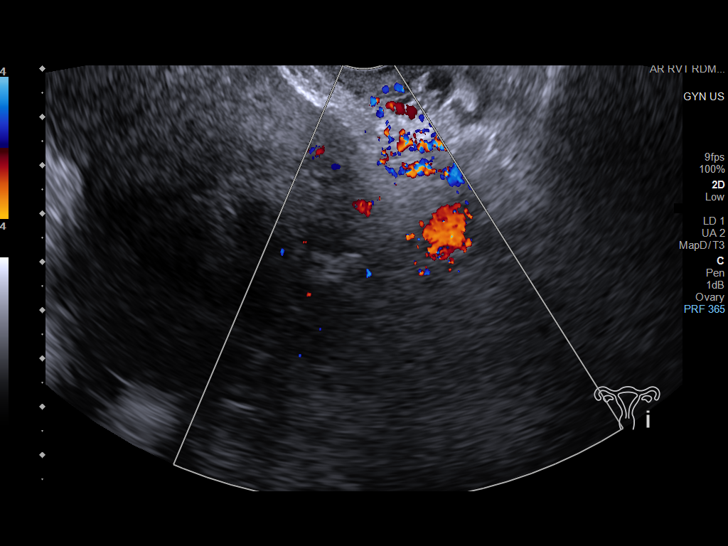
[im 77/89]
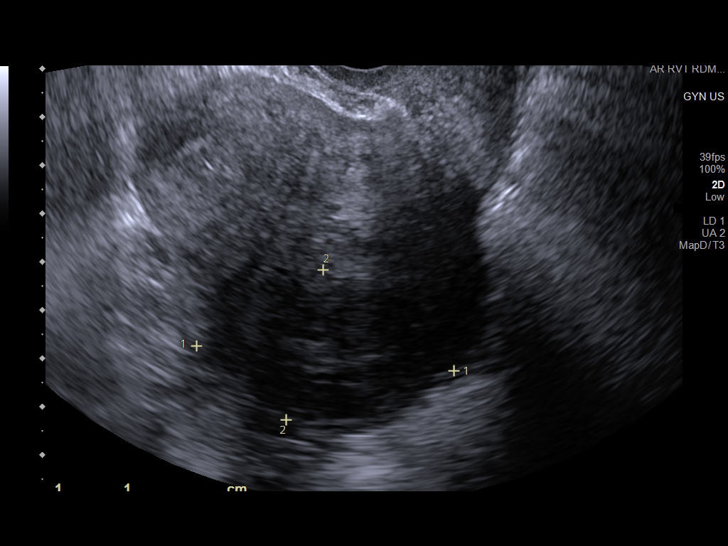
[im 85/89]
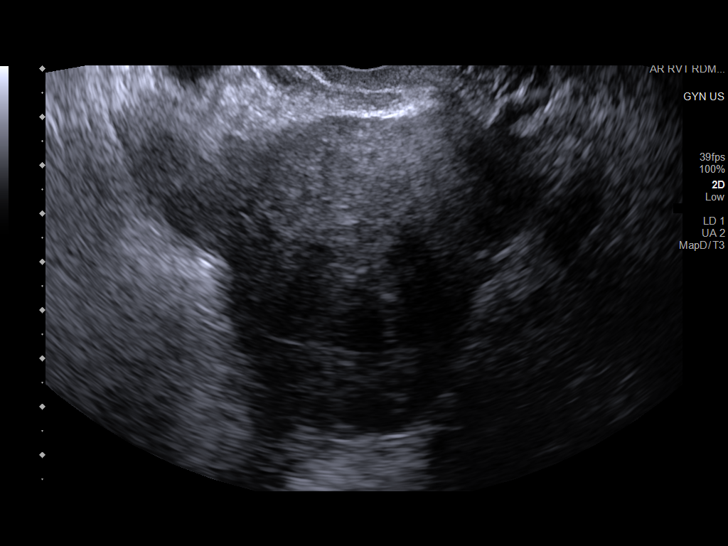

[Series 1001: gyn us · 1 of 2 slices shown (2 of 2)]
[im 1/2]
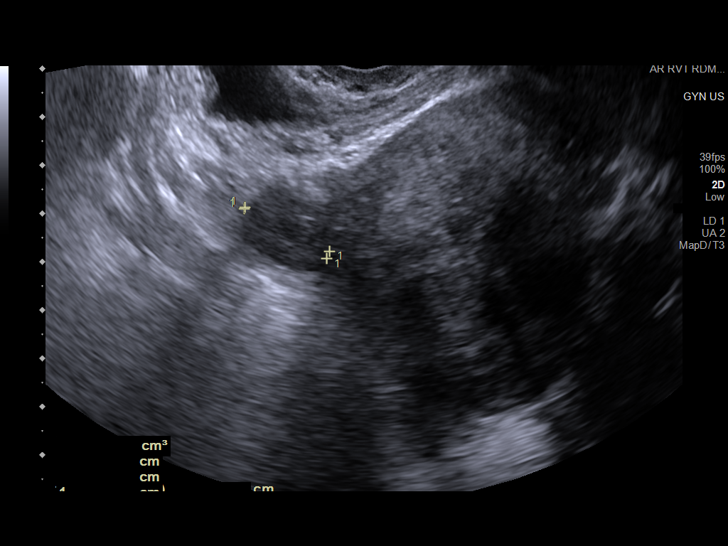

[13 of 25 positions shown; findings below may reference images not displayed]

FINDINGS: Uterus

Measurements: 9.2 x 5.2 x 4.9 cm = volume: 122 mL. Anteverted.
Heterogeneous myometrium. Exophytic leiomyoma posterior mid uterus
4.6 x 4.8 x 6.4 cm. No additional discrete masses.

Endometrium

Thickness: 2 mm.  No endometrial fluid or mass

Right ovary

Measurements: 2.1 x 2.8 x 2.0 cm = volume: 6.1 mL. Normal morphology
without mass

Left ovary

Measurements: 3.1 x 1.9 x 3.2 cm = volume: 9.8 mL. Normal morphology
without mass

Other findings

No free pelvic fluid. No adnexal masses. Visualized portions of
urinary bladder unremarkable.
IMPRESSION: Exophytic leiomyoma at posterior mid uterus 6.4 cm greatest size.

Otherwise negative exam.

## 2022-06-23 ENCOUNTER — Other Ambulatory Visit: Payer: Self-pay | Admitting: Obstetrics & Gynecology

## 2022-06-23 DIAGNOSIS — Z01818 Encounter for other preprocedural examination: Secondary | ICD-10-CM

## 2022-06-24 ENCOUNTER — Encounter: Payer: Self-pay | Admitting: Obstetrics & Gynecology

## 2022-06-24 ENCOUNTER — Ambulatory Visit
Admission: EM | Admit: 2022-06-24 | Discharge: 2022-06-24 | Disposition: A | Discharge status: Home / Self Care | Payer: BC Managed Care – PPO | Attending: Nurse Practitioner | Admitting: Nurse Practitioner

## 2022-06-24 DIAGNOSIS — M25511 Pain in right shoulder: Secondary | ICD-10-CM

## 2022-06-24 DIAGNOSIS — M542 Cervicalgia: Secondary | ICD-10-CM | POA: Diagnosis not present

## 2022-06-24 MED ORDER — METHOCARBAMOL 500 MG PO TABS: 500.0000 mg | ORAL_TABLET | Freq: Two times a day (BID) | ORAL | 0 refills | Status: AC

## 2022-06-24 MED ORDER — PREDNISONE 20 MG PO TABS: 40.0000 mg | ORAL_TABLET | Freq: Every day | ORAL | 0 refills | Status: AC

## 2022-06-24 NOTE — Discharge Instructions (Addendum)
Take medication as prescribed. May take over-the-counter Tylenol arthritis strength 650 mg tablets while taking the prednisone.  When she complete the prednisone, you can begin taking ibuprofen as needed. May apply ice or heat as needed.  Apply ice for pain or swelling, heat for spasm or stiffness.  Apply for 20 minutes, remove for 1 hour, then repeat as needed. Gentle stretching and range of motion exercises of the right neck and shoulder. If symptoms do not improve, recommend following up with orthopedics for further evaluation.  I have given you the information for [facility] [place] and for [facility]. Follow-up as needed.

## 2022-06-24 NOTE — ED Provider Notes (Addendum)
[facility]    CSN: [id number] Arrival date & time: 06/24/22  1035      History   Chief Complaint No chief complaint on file.   HPI Hannah Osborn is a 37 y.o. female.   The history is provided by the patient.   The patient presents for complaints of right shoulder pain. Symptoms started one day ago. Patient reports she thinks she may have slept on the shoulder wrong. She denies any specific any or trauma. She states this morning when she went to work, she tried lifting a heavy box and the pain worsened in the right shoulder and started in her right neck. She denies numbness, tingling, radiation of pain. She denies prior right shoulder injury.   Past Medical History:  Diagnosis Date   Allergy    Anemia    Anxiety    Anxiety and depression 03/21/2020   COVID-19    Fibroid 01/08/2016   Hypothyroid 04/03/2017   Hypothyroidism    Screen for STD (sexually transmitted disease) 10/26/2020   Thyroid disease     Patient Active Problem List   Diagnosis Date Noted   Bloating 03/29/2021   Pelvic pain 02/15/2021   Screen for STD (sexually transmitted disease) 10/26/2020   Rectocele 03/21/2020   Anxiety and depression 03/21/2020   Encounter for gynecological examination with Papanicolaou smear of cervix 03/21/2020   Hemorrhoids 03/21/2020   Dysmenorrhea 03/21/2020   Screening examination for STD (sexually transmitted disease) 07/22/2018   Encounter for well woman exam with routine gynecological exam 07/22/2018   Hypothyroidism 07/22/2018   Hypothyroid 04/03/2017   Internal and external bleeding hemorrhoids    Diarrhea, functional 03/06/2016   Rectal bleeding 03/06/2016   Fibroid 01/08/2016   Thyroid disorder 12/18/2015   Interstitial cystitis 12/18/2015   Anemia 12/18/2015   Functional ovarian cysts 12/18/2015   Menorrhagia 12/18/2015   Class 1 obesity due to excess calories with body mass index (BMI) of 34.0 to 34.9 in adult 12/18/2015   Environmental  allergies 12/18/2015    Past Surgical History:  Procedure Laterality Date   BIOPSY  11/29/2021   Procedure: BIOPSY;  Surgeon: Lanelle Bal, DO;  Location: [facility];  Service: Endoscopy;;   CHOLECYSTECTOMY     COLONOSCOPY WITH PROPOFOL N/A 11/29/2021   Procedure: COLONOSCOPY WITH PROPOFOL;  Surgeon: Lanelle Bal, DO;  Location: [facility];  Service: Endoscopy;  Laterality: N/A;  2:15 pm, pt knows to arrive at 10:15   DILITATION & CURRETTAGE/HYSTROSCOPY WITH NOVASURE ABLATION N/A 02/02/2016   Procedure: DILATATION & CURETTAGE/HYSTEROSCOPY WITH NOVASURE ABLATION (procedure 2);  Surgeon: Lazaro Arms, MD;  Location: [facility];  Service: Gynecology;  Laterality: N/A;   ESOPHAGOGASTRODUODENOSCOPY (EGD) WITH PROPOFOL N/A 11/29/2021   Procedure: ESOPHAGOGASTRODUODENOSCOPY (EGD) WITH PROPOFOL;  Surgeon: Lanelle Bal, DO;  Location: [facility];  Service: Endoscopy;  Laterality: N/A;   HEMORRHOID SURGERY N/A 05/01/2016   Procedure: EXTENSIVE HEMORRHOIDECTOMY;  Surgeon: Franky Macho, MD;  Location: [facility];  Service: General;  Laterality: N/A;   LAPAROSCOPIC BILATERAL SALPINGECTOMY Bilateral 02/02/2016   Procedure: LAPAROSCOPIC BILATERAL SALPINGECTOMY (procedure #1);  Surgeon: Lazaro Arms, MD;  Location: [facility];  Service: Gynecology;  Laterality: Bilateral;   TONSILLECTOMY     TUBAL LIGATION      OB History     Gravida  1   Para  1   Term  1   Preterm      AB      Living  1  SAB      IAB      Ectopic      Multiple      Live Births  1            Home Medications    Prior to Admission medications   Medication Sig Start Date End Date Taking? Authorizing Provider  methocarbamol (ROBAXIN) 500 MG tablet Take 1 tablet (500 mg total) by mouth 2 (two) times daily. 06/24/22  Yes Delona Clasby-Warren, Sadie Haber, NP  predniSONE (DELTASONE) 20 MG tablet Take 2 tablets (40 mg total) by mouth daily with breakfast for 5 days. 06/24/22 06/29/22 Yes Benjy Kana-Warren, Sadie Haber, NP   albuterol (VENTOLIN HFA) 108 (90 Base) MCG/ACT inhaler Inhale 1-2 puffs into the lungs every 6 (six) hours as needed for wheezing or shortness of breath. 06/22/21   Particia Nearing, PA-C  ALPRAZolam Prudy Feeler) 0.5 MG tablet Take 0.5 mg by mouth daily. 12/31/21   [provider]  ibuprofen (ADVIL) 800 MG tablet TAKE 1 TABLET BY MOUTH EVERY 8 HOURS AS NEEDED 04/01/22   Adline Potter, NP  lubiprostone (AMITIZA) 24 MCG capsule Take 1 capsule (24 mcg total) by mouth 2 (two) times daily with a meal. 03/14/22   Aida Raider, NP  megestrol (MEGACE) 40 MG tablet Take 3 tablets (120 mg total) by mouth daily. 05/06/22   Lazaro Arms, MD  polyethylene glycol powder [facility]) 17 GM/SCOOP powder 1 scoop daily or as needed 04/02/22   Lazaro Arms, MD    Family History Family History  Problem Relation Age of Onset   Pulmonary embolism Mother    Early death Mother    Cancer Father        stomach   Early death Father    Asthma Son    Allergies Son    Heart disease Maternal Uncle    Cancer Maternal Grandmother        uterine   Cancer Maternal Grandfather        colon    Social History Social History   Tobacco Use   Smoking status: Never   Smokeless tobacco: Never  Vaping Use   Vaping Use: Never used  Substance Use Topics   Alcohol use: Yes    Comment: occasional   Drug use: No     Allergies   Patient has no known allergies.   Review of Systems Review of Systems Per HPI  Physical Exam Triage Vital Signs ED Triage Vitals  Enc Vitals Group     BP 06/24/22 1120 134/89     Pulse Rate 06/24/22 1120 99     Resp 06/24/22 1120 16     Temp 06/24/22 1120 98.4 F (36.9 C)     Temp Source 06/24/22 1120 Oral     SpO2 06/24/22 1120 98 %     Weight --      Height --      Head Circumference --      Peak Flow --      Pain Score 06/24/22 1122 8     Pain Loc --      Pain Edu? --      Excl. in GC? --    No data found.  Updated Vital Signs BP 134/89 (BP  Location: Left Arm)   Pulse 99   Temp 98.4 F (36.9 C) (Oral)   Resp 16   SpO2 98%   Visual Acuity Right Eye Distance:   Left Eye Distance:   Bilateral Distance:  Right Eye Near:   Left Eye Near:    Bilateral Near:     Physical Exam Vitals and nursing note reviewed.  Constitutional:      General: She is not in acute distress.    Appearance: Normal appearance.  HENT:     Head: Normocephalic.  Pulmonary:     Effort: Pulmonary effort is normal.  Musculoskeletal:     Right shoulder: No swelling or tenderness. Decreased range of motion. Normal strength. Normal pulse.     Cervical back: Normal range of motion. Muscular tenderness (right neck) present. Normal range of motion.  Skin:    General: Skin is warm and dry.  Neurological:     General: No focal deficit present.     Mental Status: She is alert and oriented to person, place, and time.  Psychiatric:        Mood and Affect: Mood normal.        Behavior: Behavior normal.      UC Treatments / Results  Labs (all labs ordered are listed, but only abnormal results are displayed) Labs Reviewed - No data to display  EKG   Radiology No results found.  Procedures Procedures (including critical care time)  Medications Ordered in UC Medications - No data to display  Initial Impression / Assessment and Plan / UC Course  I have reviewed the triage vital signs and the nursing notes.  Pertinent labs & imaging results that were available during my care of the patient were reviewed by me and considered in my medical decision making (see chart for details).  The patient is well-appearing, she is in no acute distress, vital signs are stable.  Suspect possible inflammation of the right shoulder.  Will treat with prednisone 40 mg for the next 5 days, and methocarbamol 500 mg for muscle spasm and tightness.  Supportive care recommendations were provided and discussed with the patient to include the use of ice or heat, gentle  stretching, and over-the-counter Tylenol if symptoms persist.  Patient was advised that if symptoms do not improve, or if they worsen, recommend following up with orthopedics for further evaluation.  Patient is in agreement with this plan of care and verbalizes understanding.  All questions were answered.  Patient stable for discharge.  Work note was provided.  Final Clinical Impressions(s) / UC Diagnoses   Final diagnoses:  Right shoulder pain, unspecified chronicity  Neck pain on right side     Discharge Instructions      Take medication as prescribed. May take over-the-counter Tylenol arthritis strength 650 mg tablets while taking the prednisone.  When she complete the prednisone, you can begin taking ibuprofen as needed. May apply ice or heat as needed.  Apply ice for pain or swelling, heat for spasm or stiffness.  Apply for 20 minutes, remove for 1 hour, then repeat as needed. Gentle stretching and range of motion exercises of the right neck and shoulder. If symptoms do not improve, recommend following up with orthopedics for further evaluation.  I have given you the information for [facility] [place] and for [facility]. Follow-up as needed.     ED Prescriptions     Medication Sig Dispense Auth. Provider   predniSONE (DELTASONE) 20 MG tablet Take 2 tablets (40 mg total) by mouth daily with breakfast for 5 days. 10 tablet Hildred Pharo-Warren, Sadie Haber, NP   methocarbamol (ROBAXIN) 500 MG tablet Take 1 tablet (500 mg total) by mouth 2 (two) times daily. 20 tablet Deral Schellenberg-Warren, Sadie Haber, NP  PDMP not reviewed this encounter.   Abran Cantor, NP 06/24/22 1148    Balen Woolum-Warren, Sadie Haber, NP 06/24/22 1537

## 2022-06-24 NOTE — ED Triage Notes (Addendum)
Pt presents to UC w/ c/o rt shoulder pain since yesterday. Denies direct injury. Pt ROM limited d/t pain. Pt took ibuprofen without relief.

## 2022-06-26 NOTE — Patient Instructions (Signed)
Hannah Osborn  06/26/2022     @PREFPERIOPPHARMACY @   Your procedure is scheduled on  07/03/2022.   Report to [facility] at  0630  A.M.   Call this number if you have problems the morning of surgery:  [phone]  If you experience any cold or flu symptoms such as cough, fever, chills, shortness of breath, etc. between now and your scheduled surgery, please notify us at the above number.   Remember:  Do not eat after midnight.    You may drink clear liquids until 0430 am on 07/03/2022.           Clear liquids allowed are:                    Water, Juice (No red color; non-citric and without pulp; diabetics please choose diet or no sugar options), Carbonated beverages (diabetics please choose diet or no sugar options), Clear Tea (No creamer, milk, or cream, including half & half and powdered creamer), Black Coffee Only (No creamer, milk or cream, including half & half and powdered creamer), Plain Jell-O Only (No red color; diabetics please choose no sugar options), Clear Sports drink (No red color; diabetics please choose diet or no sugar options), and Plain Popsicles Only (No red color; diabetics please choose no sugar options)       At 0430 am on 07/03/2022 drink your carb drink.     DO NOT drink any thing else after your carb drink.    Take these medicines the morning of surgery with A SIP OF WATER                xanax(if needed, robaxin (If needed).     Do not wear jewelry, make-up or nail polish, including gel polish,  artificial nails, or any other type of covering on natural nails (fingers and  toes).  Do not wear lotions, powders, or perfumes, or deodorant.  Do not shave 48 hours prior to surgery.  Men may shave face and neck.  Do not bring valuables to the hospital.  [facility] is not responsible for any belongings or valuables.  Contacts, dentures or bridgework may not be worn into surgery.  Leave your suitcase in the car.  After surgery it may be brought to  your room.  For patients admitted to the hospital, discharge time will be determined by your treatment team.  Patients discharged the day of surgery will not be allowed to drive home and must have someone with them for 24 hours.    Special instructions:   DO NOT smoke tobacco or vape for 24 hours before your procedure.  Please read over the following fact sheets that you were given. Pain Booklet, Coughing and Deep Breathing, Blood Transfusion Information, Surgical Site Infection Prevention, Anesthesia Post-op Instructions, and Care and Recovery After Surgery         Total Laparoscopic Hysterectomy, Care After The following information offers guidance on how to care for yourself after your procedure. Your health care provider may also give you more specific instructions. If you have problems or questions, contact your health care provider. What can I expect after the procedure? After the procedure, it is common to have: Pain, bruising, and numbness around your incisions. Tiredness (fatigue). Poor appetite. Less interest in sex. Vaginal discharge or bleeding. You will need to use a sanitary pad after this procedure. Feelings of sadness or other emotions. If your ovaries were also removed, it is  also common to have symptoms of menopause, such as hot flashes, night sweats, and lack of sleep (insomnia). Follow these instructions at home: Medicines Take over-the-counter and prescription medicines only as told by your health care provider. Ask your health care provider if the medicine prescribed to you: Requires you to avoid driving or using machinery. Can cause constipation. You may need to take these actions to prevent or treat constipation: Drink enough fluid to keep your urine pale yellow. Take over-the-counter or prescription medicines. Eat foods that are high in fiber, such as beans, whole grains, and fresh fruits and vegetables. Limit foods that are high in fat and processed  sugars, such as fried or sweet foods. Incision care  Follow instructions from your health care provider about how to take care of your incisions. Make sure you: Wash your hands with soap and water for at least 20 seconds before and after you change your bandage (dressing). If soap and water are not available, use hand sanitizer. Change your dressing as told by your health care provider. Leave stitches (sutures), skin glue, or adhesive strips in place. These skin closures may need to stay in place for 2 weeks or longer. If adhesive strip edges start to loosen and curl up, you may trim the loose edges. Do not remove adhesive strips completely unless your health care provider tells you to do that. Check your incision areas every day for signs of infection. Check for: More redness, swelling, or pain. Fluid or blood. Warmth. Pus or a bad smell. Activity  Rest as told by your health care provider. Avoid sitting for a long time without moving. Get up to take short walks every 1-2 hours. This is important to improve blood flow and breathing. Ask for help if you feel weak or unsteady. Return to your normal activities as told by your health care provider. Ask your health care provider what activities are safe for you. Do not lift anything that is heavier than 10 lb (4.5 kg), or the limit that you are told, for one month after surgery or until your health care provider says that it is safe. If you were given a sedative during the procedure, it can affect you for several hours. Do not drive or operate machinery until your health care provider says that it is safe. Lifestyle Do not use any products that contain nicotine or tobacco. These products include cigarettes, chewing tobacco, and vaping devices, such as e-cigarettes. These can delay healing after surgery. If you need help quitting, ask your health care provider. Do not drink alcohol until your health care provider approves. General instructions  Do  not douche, use tampons, or have sex for at least 6 weeks, or as told by your health care provider. If you struggle with physical or emotional changes after your procedure, speak with your health care provider or a therapist. Do not take baths, swim, or use a hot tub until your health care provider approves. You may only be allowed to take showers for 2-3 weeks. Keep your dressing dry until your health care provider says it can be removed. Try to have someone at home with you for the first 1-2 weeks to help with your daily chores. Wear compression stockings as told by your health care provider. These stockings help to prevent blood clots and reduce swelling in your legs. Keep all follow-up visits. This is important. Contact a health care provider if: You have any of these signs of infection: Chills or a fever. More  redness, swelling, or pain around an incision. Fluid or blood coming from an incision. Warmth coming from an incision. Pus or a bad smell coming from an incision. An incision opens. You feel dizzy or light-headed. You have pain or bleeding when you urinate, or you are unable to urinate. You have abnormal vaginal discharge. You have pain that does not get better with medicine. Get help right away if: You have a fever and your symptoms suddenly get worse. You have severe abdominal pain. You have chest pain or shortness of breath. You faint. You have pain, swelling, or redness in your leg. You have heavy vaginal bleeding with blood clots, soaking through a sanitary pad in less than 1 hour. These symptoms may represent a serious problem that is an emergency. Do not wait to see if the symptoms will go away. Get medical help right away. Call your local emergency services (911 in the [place] Do not drive yourself to the hospital. Summary After the procedure, it is common to have pain and bruising around your incisions. Do not take baths, swim, or use a hot tub until your health care  provider approves. Do not lift anything that is heavier than 10 lb (4.5 kg), or the limit that you are told, for one month after surgery or until your health care provider says that it is safe. Tell your health care provider if you have any signs or symptoms of infection after the procedure. Get help right away if you have severe abdominal pain, chest pain, shortness of breath, or heavy bleeding from your vagina. This information is not intended to replace advice given to you by your health care provider. Make sure you discuss any questions you have with your health care provider. Document Revised: 09/09/2019 Document Reviewed: 09/10/2019 Elsevier Patient Education  2024 [employer] General Anesthesia, Adult, Care After The following information offers guidance on how to care for yourself after your procedure. Your health care provider may also give you more specific instructions. If you have problems or questions, contact your health care provider. What can I expect after the procedure? After the procedure, it is common for people to: Have pain or discomfort at the IV site. Have nausea or vomiting. Have a sore throat or hoarseness. Have trouble concentrating. Feel cold or chills. Feel weak, sleepy, or tired (fatigue). Have soreness and body aches. These can affect parts of the body that were not involved in surgery. Follow these instructions at home: For the time period you were told by your health care provider:  Rest. Do not participate in activities where you could fall or become injured. Do not drive or use machinery. Do not drink alcohol. Do not take sleeping pills or medicines that cause drowsiness. Do not make important decisions or sign legal documents. Do not take care of children on your own. General instructions Drink enough fluid to keep your urine pale yellow. If you have sleep apnea, surgery and certain medicines can increase your risk for breathing problems. Follow  instructions from your health care provider about wearing your sleep device: Anytime you are sleeping, including during daytime naps. While taking prescription pain medicines, sleeping medicines, or medicines that make you drowsy. Return to your normal activities as told by your health care provider. Ask your health care provider what activities are safe for you. Take over-the-counter and prescription medicines only as told by your health care provider. Do not use any products that contain nicotine or tobacco. These products include cigarettes, chewing tobacco,  and vaping devices, such as e-cigarettes. These can delay incision healing after surgery. If you need help quitting, ask your health care provider. Contact a health care provider if: You have nausea or vomiting that does not get better with medicine. You vomit every time you eat or drink. You have pain that does not get better with medicine. You cannot urinate or have bloody urine. You develop a skin rash. You have a fever. Get help right away if: You have trouble breathing. You have chest pain. You vomit blood. These symptoms may be an emergency. Get help right away. Call 911. Do not wait to see if the symptoms will go away. Do not drive yourself to the hospital. Summary After the procedure, it is common to have a sore throat, hoarseness, nausea, vomiting, or to feel weak, sleepy, or fatigue. For the time period you were told by your health care provider, do not drive or use machinery. Get help right away if you have difficulty breathing, have chest pain, or vomit blood. These symptoms may be an emergency. This information is not intended to replace advice given to you by your health care provider. Make sure you discuss any questions you have with your health care provider. Document Revised: 04/06/2021 Document Reviewed: 04/06/2021 Elsevier Patient Education  2024 [employer] How to Use Chlorhexidine Before  Surgery Chlorhexidine gluconate (CHG) is a germ-killing (antiseptic) solution that is used to clean the skin. It can get rid of the bacteria that normally live on the skin and can keep them away for about 24 hours. To clean your skin with CHG, you may be given: A CHG solution to use in the shower or as part of a sponge bath. A prepackaged cloth that contains CHG. Cleaning your skin with CHG may help lower the risk for infection: While you are staying in the intensive care unit of the hospital. If you have a vascular access, such as a central line, to provide short-term or long-term access to your veins. If you have a catheter to drain urine from your bladder. If you are on a ventilator. A ventilator is a machine that helps you breathe by moving air in and out of your lungs. After surgery. What are the risks? Risks of using CHG include: A skin reaction. Hearing loss, if CHG gets in your ears and you have a perforated eardrum. Eye injury, if CHG gets in your eyes and is not rinsed out. The CHG product catching fire. Make sure that you avoid smoking and flames after applying CHG to your skin. Do not use CHG: If you have a chlorhexidine allergy or have previously reacted to chlorhexidine. On babies younger than 31 months of age. How to use CHG solution Use CHG only as told by your health care provider, and follow the instructions on the label. Use the full amount of CHG as directed. Usually, this is one bottle. During a shower Follow these steps when using CHG solution during a shower (unless your health care provider gives you different instructions): Start the shower. Use your normal soap and shampoo to wash your face and hair. Turn off the shower or move out of the shower stream. Pour the CHG onto a clean washcloth. Do not use any type of brush or rough-edged sponge. Starting at your neck, lather your body down to your toes. Make sure you follow these instructions: If you will be having  surgery, pay special attention to the part of your body where you will be having  surgery. Scrub this area for at least 1 minute. Do not use CHG on your head or face. If the solution gets into your ears or eyes, rinse them well with water. Avoid your genital area. Avoid any areas of skin that have broken skin, cuts, or scrapes. Scrub your back and under your arms. Make sure to wash skin folds. Let the lather sit on your skin for 1-2 minutes or as long as told by your health care provider. Thoroughly rinse your entire body in the shower. Make sure that all body creases and crevices are rinsed well. Dry off with a clean towel. Do not put any substances on your body afterward--such as powder, lotion, or perfume--unless you are told to do so by your health care provider. Only use lotions that are recommended by the manufacturer. Put on clean clothes or pajamas. If it is the night before your surgery, sleep in clean sheets.  During a sponge bath Follow these steps when using CHG solution during a sponge bath (unless your health care provider gives you different instructions): Use your normal soap and shampoo to wash your face and hair. Pour the CHG onto a clean washcloth. Starting at your neck, lather your body down to your toes. Make sure you follow these instructions: If you will be having surgery, pay special attention to the part of your body where you will be having surgery. Scrub this area for at least 1 minute. Do not use CHG on your head or face. If the solution gets into your ears or eyes, rinse them well with water. Avoid your genital area. Avoid any areas of skin that have broken skin, cuts, or scrapes. Scrub your back and under your arms. Make sure to wash skin folds. Let the lather sit on your skin for 1-2 minutes or as long as told by your health care provider. Using a different clean, wet washcloth, thoroughly rinse your entire body. Make sure that all body creases and crevices are  rinsed well. Dry off with a clean towel. Do not put any substances on your body afterward--such as powder, lotion, or perfume--unless you are told to do so by your health care provider. Only use lotions that are recommended by the manufacturer. Put on clean clothes or pajamas. If it is the night before your surgery, sleep in clean sheets. How to use CHG prepackaged cloths Only use CHG cloths as told by your health care provider, and follow the instructions on the label. Use the CHG cloth on clean, dry skin. Do not use the CHG cloth on your head or face unless your health care provider tells you to. When washing with the CHG cloth: Avoid your genital area. Avoid any areas of skin that have broken skin, cuts, or scrapes. Before surgery Follow these steps when using a CHG cloth to clean before surgery (unless your health care provider gives you different instructions): Using the CHG cloth, vigorously scrub the part of your body where you will be having surgery. Scrub using a back-and-forth motion for 3 minutes. The area on your body should be completely wet with CHG when you are done scrubbing. Do not rinse. Discard the cloth and let the area air-dry. Do not put any substances on the area afterward, such as powder, lotion, or perfume. Put on clean clothes or pajamas. If it is the night before your surgery, sleep in clean sheets.  For general bathing Follow these steps when using CHG cloths for general bathing (unless your health care  provider gives you different instructions). Use a separate CHG cloth for each area of your body. Make sure you wash between any folds of skin and between your fingers and toes. Wash your body in the following order, switching to a new cloth after each step: The front of your neck, shoulders, and chest. Both of your arms, under your arms, and your hands. Your stomach and groin area, avoiding the genitals. Your right leg and foot. Your left leg and foot. The back of  your neck, your back, and your buttocks. Do not rinse. Discard the cloth and let the area air-dry. Do not put any substances on your body afterward--such as powder, lotion, or perfume--unless you are told to do so by your health care provider. Only use lotions that are recommended by the manufacturer. Put on clean clothes or pajamas. Contact a health care provider if: Your skin gets irritated after scrubbing. You have questions about using your solution or cloth. You swallow any chlorhexidine. Call your local poison control center (4707408914 in the [place] Get help right away if: Your eyes itch badly, or they become very red or swollen. Your skin itches badly and is red or swollen. Your hearing changes. You have trouble seeing. You have swelling or tingling in your mouth or throat. You have trouble breathing. These symptoms may represent a serious problem that is an emergency. Do not wait to see if the symptoms will go away. Get medical help right away. Call your local emergency services (911 in the [place] Do not drive yourself to the hospital. Summary Chlorhexidine gluconate (CHG) is a germ-killing (antiseptic) solution that is used to clean the skin. Cleaning your skin with CHG may help to lower your risk for infection. You may be given CHG to use for bathing. It may be in a bottle or in a prepackaged cloth to use on your skin. Carefully follow your health care provider's instructions and the instructions on the product label. Do not use CHG if you have a chlorhexidine allergy. Contact your health care provider if your skin gets irritated after scrubbing. This information is not intended to replace advice given to you by your health care provider. Make sure you discuss any questions you have with your health care provider. Document Revised: 05/07/2021 Document Reviewed: 03/20/2020 Elsevier Patient Education  2023 [employer].

## 2022-06-28 ENCOUNTER — Encounter (HOSPITAL_COMMUNITY)
Admission: RE | Admit: 2022-06-28 | Discharge: 2022-06-28 | Disposition: A | Discharge status: Home / Self Care | Payer: BC Managed Care – PPO | Source: Ambulatory Visit | Attending: Obstetrics & Gynecology | Admitting: Obstetrics & Gynecology

## 2022-06-28 DIAGNOSIS — Z01812 Encounter for preprocedural laboratory examination: Secondary | ICD-10-CM | POA: Insufficient documentation

## 2022-06-28 DIAGNOSIS — Z01818 Encounter for other preprocedural examination: Secondary | ICD-10-CM

## 2022-06-28 LAB — CBC
HCT: 32.7 % — ABNORMAL LOW (ref 36.0–46.0)
Hemoglobin: 10 g/dL — ABNORMAL LOW (ref 12.0–15.0)
MCH: 19.6 pg — ABNORMAL LOW (ref 26.0–34.0)
MCHC: 30.6 g/dL (ref 30.0–36.0)
MCV: 64.2 fL — ABNORMAL LOW (ref 80.0–100.0)
Platelets: 367 10*3/uL (ref 150–400)
RBC: 5.09 MIL/uL (ref 3.87–5.11)
RDW: 20.9 % — ABNORMAL HIGH (ref 11.5–15.5)
WBC: 7.4 10*3/uL (ref 4.0–10.5)
nRBC: 0 % (ref 0.0–0.2)

## 2022-06-28 LAB — POCT PREGNANCY, URINE: Preg Test, Ur: NEGATIVE

## 2022-06-28 LAB — COMPREHENSIVE METABOLIC PANEL
ALT: 16 U/L (ref 0–44)
AST: 14 U/L — ABNORMAL LOW (ref 15–41)
Albumin: 3.8 g/dL (ref 3.5–5.0)
Alkaline Phosphatase: 48 U/L (ref 38–126)
Anion gap: 9 (ref 5–15)
BUN: 16 mg/dL (ref 6–20)
CO2: 24 mmol/L (ref 22–32)
Calcium: 9.1 mg/dL (ref 8.9–10.3)
Chloride: 104 mmol/L (ref 98–111)
Creatinine, Ser: 0.73 mg/dL (ref 0.44–1.00)
GFR, Estimated: >60 mL/min (ref >60)
Glucose, Bld: 82 mg/dL (ref 70–99)
Potassium: 3.3 mmol/L — ABNORMAL LOW (ref 3.5–5.1)
Sodium: 137 mmol/L (ref 135–145)
Total Bilirubin: 0.3 mg/dL (ref 0.3–1.2)
Total Protein: 7.1 g/dL (ref 6.5–8.1)

## 2022-06-28 LAB — TYPE AND SCREEN
ABO/RH(D): O POS
Antibody Screen: NEGATIVE

## 2022-06-28 LAB — RAPID HIV SCREEN (HIV 1/2 AB+AG)
HIV 1/2 Antibodies: NONREACTIVE
HIV-1 P24 Antigen - HIV24: NONREACTIVE

## 2022-07-03 ENCOUNTER — Ambulatory Visit (HOSPITAL_COMMUNITY)
Admission: RE | Admit: 2022-07-03 | Discharge: 2022-07-03 | Disposition: A | Discharge status: Home / Self Care | Payer: BC Managed Care – PPO | Source: Ambulatory Visit | Attending: Obstetrics & Gynecology | Admitting: Obstetrics & Gynecology

## 2022-07-03 ENCOUNTER — Ambulatory Visit (HOSPITAL_COMMUNITY): Payer: BC Managed Care – PPO | Admitting: Anesthesiology

## 2022-07-03 ENCOUNTER — Encounter (HOSPITAL_COMMUNITY)
Admission: RE | Disposition: A | Discharge status: Home / Self Care | Payer: Self-pay | Source: Ambulatory Visit | Attending: Obstetrics & Gynecology

## 2022-07-03 ENCOUNTER — Other Ambulatory Visit: Payer: Self-pay

## 2022-07-03 DIAGNOSIS — D259 Leiomyoma of uterus, unspecified: Secondary | ICD-10-CM

## 2022-07-03 DIAGNOSIS — N946 Dysmenorrhea, unspecified: Secondary | ICD-10-CM | POA: Diagnosis not present

## 2022-07-03 DIAGNOSIS — D219 Benign neoplasm of connective and other soft tissue, unspecified: Secondary | ICD-10-CM

## 2022-07-03 DIAGNOSIS — D759 Disease of blood and blood-forming organs, unspecified: Secondary | ICD-10-CM | POA: Diagnosis not present

## 2022-07-03 DIAGNOSIS — D500 Iron deficiency anemia secondary to blood loss (chronic): Secondary | ICD-10-CM | POA: Diagnosis not present

## 2022-07-03 DIAGNOSIS — D649 Anemia, unspecified: Secondary | ICD-10-CM | POA: Diagnosis not present

## 2022-07-03 DIAGNOSIS — N920 Excessive and frequent menstruation with regular cycle: Secondary | ICD-10-CM | POA: Diagnosis not present

## 2022-07-03 HISTORY — PX: ROBOTIC ASSISTED LAPAROSCOPIC HYSTERECTOMY AND SALPINGECTOMY: SHX6379

## 2022-07-03 LAB — ABO/RH: ABO/RH(D): O POS

## 2022-07-03 SURGERY — XI ROBOTIC ASSISTED LAPAROSCOPIC HYSTERECTOMY AND SALPINGECTOMY
Anesthesia: General | Site: Abdomen

## 2022-07-03 MED ORDER — PHENYLEPHRINE 80 MCG/ML (10ML) SYRINGE FOR IV PUSH (FOR BLOOD PRESSURE SUPPORT)
PREFILLED_SYRINGE | INTRAVENOUS | Status: AC
  Filled 2022-07-03: qty 10

## 2022-07-03 MED ORDER — ONDANSETRON HCL 4 MG/2ML IJ SOLN
INTRAMUSCULAR | Status: DC | PRN
  Administered 2022-07-03: 4 mg via INTRAVENOUS

## 2022-07-03 MED ORDER — DEXAMETHASONE SODIUM PHOSPHATE 10 MG/ML IJ SOLN
INTRAMUSCULAR | Status: AC
  Filled 2022-07-03: qty 1

## 2022-07-03 MED ORDER — BUPIVACAINE HCL (PF) 0.5 % IJ SOLN
INTRAMUSCULAR | Status: DC | PRN
  Administered 2022-07-03: 40 mL

## 2022-07-03 MED ORDER — CIPROFLOXACIN HCL 500 MG PO TABS: 500.0000 mg | ORAL_TABLET | Freq: Two times a day (BID) | ORAL | 0 refills | Status: DC

## 2022-07-03 MED ORDER — FENTANYL CITRATE (PF) 100 MCG/2ML IJ SOLN
INTRAMUSCULAR | Status: AC
  Filled 2022-07-03: qty 2

## 2022-07-03 MED ORDER — MIDAZOLAM HCL 2 MG/2ML IJ SOLN
INTRAMUSCULAR | Status: AC
  Filled 2022-07-03: qty 2

## 2022-07-03 MED ORDER — ONDANSETRON 8 MG PO TBDP: 8.0000 mg | ORAL_TABLET | Freq: Three times a day (TID) | ORAL | 0 refills | Status: DC | PRN

## 2022-07-03 MED ORDER — ONDANSETRON HCL 4 MG/2ML IJ SOLN
INTRAMUSCULAR | Status: AC
  Filled 2022-07-03: qty 2

## 2022-07-03 MED ORDER — KETOROLAC TROMETHAMINE 10 MG PO TABS: 10.0000 mg | ORAL_TABLET | Freq: Three times a day (TID) | ORAL | 0 refills | Status: DC | PRN

## 2022-07-03 MED ORDER — FENTANYL CITRATE (PF) 100 MCG/2ML IJ SOLN
INTRAMUSCULAR | Status: DC | PRN
  Administered 2022-07-03: 100 ug via INTRAVENOUS
  Administered 2022-07-03 (×3): 50 ug via INTRAVENOUS

## 2022-07-03 MED ORDER — KETOROLAC TROMETHAMINE 30 MG/ML IJ SOLN
INTRAMUSCULAR | Status: AC
  Filled 2022-07-03: qty 1

## 2022-07-03 MED ORDER — PROPOFOL 10 MG/ML IV BOLUS
INTRAVENOUS | Status: AC
  Filled 2022-07-03: qty 20

## 2022-07-03 MED ORDER — MEPERIDINE HCL 50 MG/ML IJ SOLN: 6.2500 mg | INTRAMUSCULAR | Status: DC | PRN

## 2022-07-03 MED ORDER — HYDROMORPHONE HCL 1 MG/ML IJ SOLN
INTRAMUSCULAR | Status: DC | PRN
  Administered 2022-07-03 (×2): .5 mg via INTRAVENOUS

## 2022-07-03 MED ORDER — ONDANSETRON HCL 4 MG/2ML IJ SOLN: 4.0000 mg | Freq: Once | INTRAMUSCULAR | Status: DC | PRN

## 2022-07-03 MED ORDER — LACTATED RINGERS IV SOLN: INTRAVENOUS | Status: DC

## 2022-07-03 MED ORDER — KETOROLAC TROMETHAMINE 30 MG/ML IJ SOLN
30.0000 mg | Freq: Once | INTRAMUSCULAR | Status: AC
  Administered 2022-07-03: 30 mg via INTRAVENOUS
  Filled 2022-07-03: qty 1

## 2022-07-03 MED ORDER — BUPIVACAINE HCL 0.25 % IJ SOLN: INTRAMUSCULAR | Status: DC | PRN

## 2022-07-03 MED ORDER — PROPOFOL 10 MG/ML IV BOLUS
INTRAVENOUS | Status: DC | PRN
  Administered 2022-07-03: 150 mg via INTRAVENOUS
  Administered 2022-07-03: 30 mg via INTRAVENOUS

## 2022-07-03 MED ORDER — BUPIVACAINE HCL (PF) 0.5 % IJ SOLN
INTRAMUSCULAR | Status: AC
  Filled 2022-07-03: qty 30

## 2022-07-03 MED ORDER — POVIDONE-IODINE 10 % EX SWAB: 2.0000 | Freq: Once | CUTANEOUS | Status: DC

## 2022-07-03 MED ORDER — ROCURONIUM BROMIDE 10 MG/ML (PF) SYRINGE
PREFILLED_SYRINGE | INTRAVENOUS | Status: DC | PRN
  Administered 2022-07-03: 50 mg via INTRAVENOUS
  Administered 2022-07-03: 20 mg via INTRAVENOUS
  Administered 2022-07-03: 30 mg via INTRAVENOUS
  Administered 2022-07-03: 50 mg via INTRAVENOUS

## 2022-07-03 MED ORDER — ROCURONIUM BROMIDE 10 MG/ML (PF) SYRINGE
PREFILLED_SYRINGE | INTRAVENOUS | Status: AC
  Filled 2022-07-03: qty 10

## 2022-07-03 MED ORDER — OXYCODONE-ACETAMINOPHEN 7.5-325 MG PO TABS: 1.0000 | ORAL_TABLET | Freq: Four times a day (QID) | ORAL | 0 refills | Status: DC | PRN

## 2022-07-03 MED ORDER — ORAL CARE MOUTH RINSE: 15.0000 mL | Freq: Once | OROMUCOSAL | Status: DC

## 2022-07-03 MED ORDER — KETOROLAC TROMETHAMINE 30 MG/ML IJ SOLN
30.0000 mg | Freq: Once | INTRAMUSCULAR | Status: AC
  Administered 2022-07-03: 30 mg via INTRAMUSCULAR

## 2022-07-03 MED ORDER — CHLORHEXIDINE GLUCONATE 0.12 % MT SOLN: 15.0000 mL | Freq: Once | OROMUCOSAL | Status: DC

## 2022-07-03 MED ORDER — SUGAMMADEX SODIUM 200 MG/2ML IV SOLN
INTRAVENOUS | Status: DC | PRN
  Administered 2022-07-03: 200 mg via INTRAVENOUS

## 2022-07-03 MED ORDER — MIDAZOLAM HCL 2 MG/2ML IJ SOLN
INTRAMUSCULAR | Status: DC | PRN
  Administered 2022-07-03: 2 mg via INTRAVENOUS

## 2022-07-03 MED ORDER — CEFAZOLIN SODIUM-DEXTROSE 2-4 GM/100ML-% IV SOLN
2.0000 g | INTRAVENOUS | Status: AC
  Administered 2022-07-03: 2 g via INTRAVENOUS

## 2022-07-03 MED ORDER — SCOPOLAMINE 1 MG/3DAYS TD PT72
1.0000 | MEDICATED_PATCH | Freq: Once | TRANSDERMAL | Status: DC
  Administered 2022-07-03: 1.5 mg via TRANSDERMAL
  Filled 2022-07-03: qty 1

## 2022-07-03 MED ORDER — CEFAZOLIN SODIUM-DEXTROSE 2-4 GM/100ML-% IV SOLN
INTRAVENOUS | Status: AC
  Filled 2022-07-03: qty 100

## 2022-07-03 MED ORDER — HYDROMORPHONE HCL 1 MG/ML IJ SOLN
INTRAMUSCULAR | Status: AC
  Filled 2022-07-03: qty 1

## 2022-07-03 MED ORDER — DEXAMETHASONE SODIUM PHOSPHATE 10 MG/ML IJ SOLN
INTRAMUSCULAR | Status: DC | PRN
  Administered 2022-07-03: 8 mg via INTRAVENOUS

## 2022-07-03 MED ORDER — LIDOCAINE HCL (PF) 2 % IJ SOLN
INTRAMUSCULAR | Status: AC
  Filled 2022-07-03: qty 5

## 2022-07-03 MED ORDER — LIDOCAINE HCL (CARDIAC) PF 100 MG/5ML IV SOSY
PREFILLED_SYRINGE | INTRAVENOUS | Status: DC | PRN
  Administered 2022-07-03: 80 mg via INTRATRACHEAL

## 2022-07-03 MED ORDER — KETOROLAC TROMETHAMINE 60 MG/2ML IM SOLN
30.0000 mg | Freq: Once | INTRAMUSCULAR | Status: DC
  Filled 2022-07-03: qty 2

## 2022-07-03 MED ORDER — HYDROMORPHONE HCL 1 MG/ML IJ SOLN: 0.2500 mg | INTRAMUSCULAR | Status: DC | PRN

## 2022-07-03 MED ORDER — STERILE WATER FOR IRRIGATION IR SOLN
Status: DC | PRN
  Administered 2022-07-03: 1000 mL

## 2022-07-03 MED ORDER — CHLORHEXIDINE GLUCONATE 0.12 % MT SOLN
OROMUCOSAL | Status: AC
  Administered 2022-07-03: 15 mL
  Filled 2022-07-03: qty 15

## 2022-07-03 SURGICAL SUPPLY — 61 items
ADH SKN CLS APL DERMABOND .7 (GAUZE/BANDAGES/DRESSINGS) ×1
BLADE SURG 15 STRL LF DISP TIS (BLADE) IMPLANT
BLADE SURG 15 STRL SS (BLADE) ×1
BLADE SURG SZ10 CARB STEEL (BLADE) IMPLANT
BLADE SURG SZ11 CARB STEEL (BLADE) ×1 IMPLANT
CAUTERY HOOK MNPLR 1.6 DVNC XI (INSTRUMENTS) ×1 IMPLANT
COVER LIGHT HANDLE (MISCELLANEOUS) IMPLANT
COVER LIGHT HANDLE STERIS (MISCELLANEOUS) ×2 IMPLANT
COVER MAYO STAND XLG (MISCELLANEOUS) ×1 IMPLANT
COVER TIP SHEARS 8 DVNC (MISCELLANEOUS) ×1 IMPLANT
DERMABOND ADVANCED .7 DNX12 (GAUZE/BANDAGES/DRESSINGS) ×1 IMPLANT
DRAPE ARM DVNC X/XI (DISPOSABLE) ×4 IMPLANT
DRAPE COLUMN DVNC XI (DISPOSABLE) ×1 IMPLANT
DRIVER NDL MEGA SUTCUT DVNCXI (INSTRUMENTS) ×1 IMPLANT
DRIVER NDLE MEGA SUTCUT DVNCXI (INSTRUMENTS) ×1 IMPLANT
ELECT REM PT RETURN 9FT ADLT (ELECTROSURGICAL) ×1
ELECTRODE REM PT RTRN 9FT ADLT (ELECTROSURGICAL) ×1 IMPLANT
FORCEPS PROGRASP DVNC XI (FORCEP) ×1 IMPLANT
GAUZE 4X4 16PLY ~~LOC~~+RFID DBL (SPONGE) ×2 IMPLANT
GLOVE BIOGEL PI IND STRL 7.0 (GLOVE) ×3 IMPLANT
GLOVE BIOGEL PI IND STRL 8 (GLOVE) ×2 IMPLANT
GLOVE ECLIPSE 6.5 STRL STRAW (GLOVE) IMPLANT
GLOVE ECLIPSE 7.0 STRL STRAW (GLOVE) IMPLANT
GLOVE ECLIPSE 8.0 STRL XLNG CF (GLOVE) ×3 IMPLANT
GOWN STRL REUS W/ TWL XL LVL3 (GOWN DISPOSABLE) IMPLANT
GOWN STRL REUS W/TWL LRG LVL3 (GOWN DISPOSABLE) ×2 IMPLANT
GOWN STRL REUS W/TWL XL LVL3 (GOWN DISPOSABLE) ×5 IMPLANT
GYRUS RUMI II 3.5CM BLUE (DISPOSABLE) ×1
KIT PINK PAD W/HEAD ARE REST (MISCELLANEOUS) ×1
KIT PINK PAD W/HEAD ARM REST (MISCELLANEOUS) ×1 IMPLANT
KIT TURNOVER CYSTO (KITS) ×1 IMPLANT
MANIFOLD NEPTUNE II (INSTRUMENTS) ×1 IMPLANT
NDL HYPO 21X1.5 SAFETY (NEEDLE) ×1 IMPLANT
NDL INSUFFLATION 14GA 120MM (NEEDLE) ×1 IMPLANT
NEEDLE HYPO 21X1.5 SAFETY (NEEDLE) ×1 IMPLANT
NEEDLE INSUFFLATION 14GA 120MM (NEEDLE) ×1 IMPLANT
OBTURATOR OPTICAL STND 8 DVNC (TROCAR) ×1
OBTURATOR OPTICALSTD 8 DVNC (TROCAR) ×1 IMPLANT
PACK PERI GYN (CUSTOM PROCEDURE TRAY) ×1 IMPLANT
RETRACTOR WOUND ALXS 18CM MED (MISCELLANEOUS) IMPLANT
RTRCTR WOUND ALEXIS O 18CM MED (MISCELLANEOUS) ×1
RUMI II GYRUS 3.5CM BLUE (DISPOSABLE) IMPLANT
SCISSORS MNPLR CVD DVNC XI (INSTRUMENTS) IMPLANT
SEAL CANN UNIV 5-8 DVNC XI (MISCELLANEOUS) ×3 IMPLANT
SEALER VESSEL EXT DVNC XI (MISCELLANEOUS) ×1 IMPLANT
SET BASIN LINEN APH (SET/KITS/TRAYS/PACK) ×1 IMPLANT
SET TRI-LUMEN FLTR TB AIRSEAL (TUBING) ×1 IMPLANT
SOL ANTI FOG 6CC (MISCELLANEOUS) ×1 IMPLANT
SPONGE T-LAP 18X18 ~~LOC~~+RFID (SPONGE) ×1 IMPLANT
SUT STRATAFIX 0 PDS+ CT-2 23 (SUTURE) ×1
SUT VICRYL 0 UR6 27IN ABS (SUTURE) ×1 IMPLANT
SUT VICRYL AB 3-0 FS1 BRD 27IN (SUTURE) ×2 IMPLANT
SUTURE STRATFX 0 PDS+ CT-2 23 (SUTURE) ×1 IMPLANT
SYR 10ML LL (SYRINGE) ×2 IMPLANT
SYR 20ML LL LF (SYRINGE) ×1 IMPLANT
SYR 50ML LL SCALE MARK (SYRINGE) ×2 IMPLANT
SYR CONTROL 10ML LL (SYRINGE) ×2 IMPLANT
TIP UTERINE 6.7X8CM BLUE DISP (MISCELLANEOUS) IMPLANT
TRAY FOLEY W/BAG SLVR 16FR (SET/KITS/TRAYS/PACK) ×1
TRAY FOLEY W/BAG SLVR 16FR ST (SET/KITS/TRAYS/PACK) ×1 IMPLANT
TROCAR PORT AIRSEAL 8X120 (TROCAR) ×1 IMPLANT

## 2022-07-03 NOTE — H&P (Signed)
Preoperative History and Physical  Hannah Osborn is a 37 y.o. G1P1001 with No LMP recorded. (Menstrual status: Irregular Periods). admitted for a Xi Robot assisted total laparoscopic hysterectomy with bilateral salpingectomy for definitive management of menorrhagia, dysmenorrhea and resulting iron deficiency anemia with a sonographic finding of a fibroid.  Hannah Osborn underwent a bilateral salpingectomy + Novasure endometrial ablation 02/02/2016 for similar At that time she responded to megestrol which is no longer working She has been having pelvic pain as well Having exhausted our conservative measures pt opts for hysterectomy and specifically robot assisted TLH, consented for salpingectomy although likely no distal tube remains We will also perform a TAP block  PMH:    Past Medical History:  Diagnosis Date   Allergy    Anemia    Anxiety    Anxiety and depression 03/21/2020   COVID-19    Fibroid 01/08/2016   Hypothyroid 04/03/2017   Hypothyroidism    Screen for STD (sexually transmitted disease) 10/26/2020   Thyroid disease     PSH:     Past Surgical History:  Procedure Laterality Date   BIOPSY  11/29/2021   Procedure: BIOPSY;  Surgeon: Lanelle Bal, DO;  [facility]: [facility];  Service: Endoscopy;;   CHOLECYSTECTOMY     COLONOSCOPY WITH PROPOFOL N/A 11/29/2021   Procedure: COLONOSCOPY WITH PROPOFOL;  Surgeon: Lanelle Bal, DO;  Location: [facility];  Service: Endoscopy;  Laterality: N/A;  2:15 pm, pt knows to arrive at 10:15   DILITATION & CURRETTAGE/HYSTROSCOPY WITH NOVASURE ABLATION N/A 02/02/2016   Procedure: DILATATION & CURETTAGE/HYSTEROSCOPY WITH NOVASURE ABLATION (procedure 2);  Surgeon: Lazaro Arms, MD;  Location: [facility];  Service: Gynecology;  Laterality: N/A;   ESOPHAGOGASTRODUODENOSCOPY (EGD) WITH PROPOFOL N/A 11/29/2021   Procedure: ESOPHAGOGASTRODUODENOSCOPY (EGD) WITH PROPOFOL;  Surgeon: Lanelle Bal, DO;  Location: [facility];  Service: Endoscopy;   Laterality: N/A;   HEMORRHOID SURGERY N/A 05/01/2016   Procedure: EXTENSIVE HEMORRHOIDECTOMY;  Surgeon: Franky Macho, MD;  Location: [facility];  Service: General;  Laterality: N/A;   LAPAROSCOPIC BILATERAL SALPINGECTOMY Bilateral 02/02/2016   Procedure: LAPAROSCOPIC BILATERAL SALPINGECTOMY (procedure #1);  Surgeon: Lazaro Arms, MD;  Location: [facility];  Service: Gynecology;  Laterality: Bilateral;   TONSILLECTOMY     TUBAL LIGATION      POb/GynH:      OB History     Gravida  1   Para  1   Term  1   Preterm      AB      Living  1      SAB      IAB      Ectopic      Multiple      Live Births  1           SH:   Social History   Tobacco Use   Smoking status: Never   Smokeless tobacco: Never  Vaping Use   Vaping Use: Never used  Substance Use Topics   Alcohol use: Yes    Comment: occasional   Drug use: No    FH:    Family History  Problem Relation Age of Onset   Pulmonary embolism Mother    Early death Mother    Cancer Father        stomach   Early death Father    Asthma Son    Allergies Son    Heart disease Maternal Uncle    Cancer Maternal Grandmother  uterine   Cancer Maternal Grandfather        colon     Allergies: No Known Allergies  Medications:       Current Facility-Administered Medications:    ceFAZolin (ANCEF) 2-4 GM/100ML-% IVPB, , , ,    ceFAZolin (ANCEF) IVPB 2g/100 mL premix, 2 g, Intravenous, On Call to OR, Lazaro Arms, MD   chlorhexidine (PERIDEX) 0.12 % solution 15 mL, 15 mL, Mouth/Throat, Once **OR** Oral care mouth rinse, 15 mL, Mouth Rinse, Once, Battula, Rajamani C, MD   lactated ringers infusion, , Intravenous, Continuous, Battula, Rajamani C, MD, Last Rate: 50 mL/hr at 07/03/22 0711, New Bag at 07/03/22 0711   povidone-iodine 10 % swab 2 Application, 2 Application, Topical, Once, Lazaro Arms, MD   scopolamine (TRANSDERM-SCOP) 1 MG/3DAYS 1.5 mg, 1 patch, Transdermal, Once, Battula, Rajamani C, MD, 1.5 mg at  07/03/22 1610  Review of Systems:   Review of Systems  Constitutional: Negative for fever, chills, weight loss, malaise/fatigue and diaphoresis.  HENT: Negative for hearing loss, ear pain, nosebleeds, congestion, sore throat, neck pain, tinnitus and ear discharge.   Eyes: Negative for blurred vision, double vision, photophobia, pain, discharge and redness.  Respiratory: Negative for cough, hemoptysis, sputum production, shortness of breath, wheezing and stridor.   Cardiovascular: Negative for chest pain, palpitations, orthopnea, claudication, leg swelling and PND.  Gastrointestinal: Positive for abdominal pain. Negative for heartburn, nausea, vomiting, diarrhea, constipation, blood in stool and melena.  Genitourinary: Negative for dysuria, urgency, frequency, hematuria and flank pain.  Musculoskeletal: Negative for myalgias, back pain, joint pain and falls.  Skin: Negative for itching and rash.  Neurological: Negative for dizziness, tingling, tremors, sensory change, speech change, focal weakness, seizures, loss of consciousness, weakness and headaches.  Endo/Heme/Allergies: Negative for environmental allergies and polydipsia. Does not bruise/bleed easily.  Psychiatric/Behavioral: Negative for depression, suicidal ideas, hallucinations, memory loss and substance abuse. The patient is not nervous/anxious and does not have insomnia.      PHYSICAL EXAM:  Blood pressure 123/82, pulse 99, temperature 98.8 F (37.1 C), temperature source Oral, resp. rate 18, height 5\' 3"  (1.6 m), weight 91.6 kg, SpO2 99 %.    Vitals reviewed. Constitutional: She is oriented to person, place, and time. She appears well-developed and well-nourished.  HENT:  Head: Normocephalic and atraumatic.  Right Ear: External ear normal.  Left Ear: External ear normal.  Nose: Nose normal.  Mouth/Throat: Oropharynx is clear and moist.  Eyes: Conjunctivae and EOM are normal. Pupils are equal, round, and reactive to light.  Right eye exhibits no discharge. Left eye exhibits no discharge. No scleral icterus.  Neck: Normal range of motion. Neck supple. No tracheal deviation present. No thyromegaly present.  Cardiovascular: Normal rate, regular rhythm, normal heart sounds and intact distal pulses.  Exam reveals no gallop and no friction rub.   No murmur heard. Respiratory: Effort normal and breath sounds normal. No respiratory distress. She has no wheezes. She has no rales. She exhibits no tenderness.  GI: Soft. Bowel sounds are normal. She exhibits no distension and no mass. There is tenderness. There is no rebound and no guarding.  Genitourinary:       Vulva is normal without lesions Vagina is pink moist without discharge Cervix normal in appearance and pap is normal Uterus is normal size, contour, position, consistency, mobility, non-tender Adnexa is negative with normal sized ovaries by sonogram  Musculoskeletal: Normal range of motion. She exhibits no edema and no tenderness.  Neurological: She is alert and  oriented to person, place, and time. She has normal reflexes. She displays normal reflexes. No cranial nerve deficit. She exhibits normal muscle tone. Coordination normal.  Skin: Skin is warm and dry. No rash noted. No erythema. No pallor.  Psychiatric: She has a normal mood and affect. Her behavior is normal. Judgment and thought content normal.    Labs: Results for orders placed or performed during the hospital encounter of 07/03/22 (from the past 336 hour(s))  ABO/Rh   Collection Time: 07/03/22  6:52 AM  Result Value Ref Range   ABO/RH(D)      O POS Performed at [facility], [address], [place], [place] [address]   Results for orders placed or performed during the hospital encounter of 06/28/22 (from the past 336 hour(s))  CBC   Collection Time: 06/28/22  8:19 AM  Result Value Ref Range   WBC 7.4 4.0 - 10.5 K/uL   RBC 5.09 3.87 - 5.11 MIL/uL   Hemoglobin 10.0 (L) 12.0 - 15.0 g/dL   HCT 65.7  (L) 84.6 - 46.0 %   MCV 64.2 (L) 80.0 - 100.0 fL   MCH 19.6 (L) 26.0 - 34.0 pg   MCHC 30.6 30.0 - 36.0 g/dL   RDW 96.2 (H) 95.2 - 84.1 %   Platelets 367 150 - 400 K/uL   nRBC 0.0 0.0 - 0.2 %  Comprehensive metabolic panel   Collection Time: 06/28/22  8:19 AM  Result Value Ref Range   Sodium 137 135 - 145 mmol/L   Potassium 3.3 (L) 3.5 - 5.1 mmol/L   Chloride 104 98 - 111 mmol/L   CO2 24 22 - 32 mmol/L   Glucose, Bld 82 70 - 99 mg/dL   BUN 16 6 - 20 mg/dL   Creatinine, Ser 3.24 0.44 - 1.00 mg/dL   Calcium 9.1 8.9 - 40.1 mg/dL   Total Protein 7.1 6.5 - 8.1 g/dL   Albumin 3.8 3.5 - 5.0 g/dL   AST 14 (L) 15 - 41 U/L   ALT 16 0 - 44 U/L   Alkaline Phosphatase 48 38 - 126 U/L   Total Bilirubin 0.3 0.3 - 1.2 mg/dL   GFR, Estimated >02 >72 mL/min   Anion gap 9 5 - 15  Rapid HIV screen (HIV 1/2 Ab+Ag)   Collection Time: 06/28/22  8:19 AM  Result Value Ref Range   HIV-1 P24 Antigen - HIV24 NON REACTIVE NON REACTIVE   HIV 1/2 Antibodies NON REACTIVE NON REACTIVE   Interpretation (HIV Ag Ab)      A non reactive test result means that HIV 1 or HIV 2 antibodies and HIV 1 p24 antigen were not detected in the specimen.  Type and screen   Collection Time: 06/28/22  8:19 AM  Result Value Ref Range   ABO/RH(D) O POS    Antibody Screen NEG    Sample Expiration 07/12/2022,2359    Extend sample reason      NO TRANSFUSIONS OR PREGNANCY IN THE PAST 3 MONTHS Performed at [facility], [address], [place], [place] [address]   Pregnancy, urine POC   Collection Time: 06/28/22  9:00 AM  Result Value Ref Range   Preg Test, Ur NEGATIVE NEGATIVE    EKG: Orders placed or performed during the hospital encounter of 04/15/19   ED EKG   ED EKG   EKG 12-Lead   EKG 12-Lead   EKG    Imaging Studies: No results found.    Assessment: 1. Menorrhagia with regular cycle  N92.0       2. Fibroid  D21.9       3. Dysmenorrhea  N94.6       4. Iron deficiency anemia due to chronic blood loss   D50.0    Plan: Robotic assisted total laparoscopic hysterectomy with bilateral salpingectomy  Pt understands the risks of surgery including but not limited t  excessive bleeding requiring transfusion or reoperation, post-operative infection requiring prolonged hospitalization or re-hospitalization and antibiotic therapy, and damage to other organs including bladder, bowel, ureters and major vessels.  The patient also understands the alternative treatment options which were discussed in full.  All questions were answered.  Lazaro Arms 07/03/2022 8:10 AM   Lazaro Arms 07/03/2022 8:09 AM

## 2022-07-03 NOTE — Anesthesia Procedure Notes (Signed)
Procedure Name: Intubation Date/Time: 07/03/2022 8:43 AM  Performed by: Oletha Cruel, CRNAPre-anesthesia Checklist: Patient identified, Emergency Drugs available, Suction available, Patient being monitored and Timeout performed Patient Re-evaluated:Patient Re-evaluated prior to induction Oxygen Delivery Method: Circle system utilized Preoxygenation: Pre-oxygenation with 100% oxygen Induction Type: IV induction Ventilation: Mask ventilation without difficulty Laryngoscope Size: Mac and 4 Grade View: Grade II Tube type: Oral Tube size: 7.0 mm Number of attempts: 1 Airway Equipment and Method: Stylet Placement Confirmation: ETT inserted through vocal cords under direct vision, positive ETCO2 and breath sounds checked- equal and bilateral Secured at: 21 (Secured 21cm at lower lip.) cm Dental Injury: Teeth and Oropharynx as per pre-operative assessment

## 2022-07-03 NOTE — Anesthesia Preprocedure Evaluation (Signed)
Anesthesia Evaluation  Patient identified by MRN, date of birth, ID band Patient awake    Reviewed: Allergy & Precautions, H&P , NPO status , Patient's Chart, lab work & pertinent test results  Airway Mallampati: II  TM Distance: >3 FB Neck ROM: Full    Dental  (+) Upper Dentures, Dental Advisory Given   Pulmonary neg pulmonary ROS   Pulmonary exam normal breath sounds clear to auscultation       Cardiovascular Exercise Tolerance: Good negative cardio ROS Normal cardiovascular exam Rhythm:Regular Rate:Normal     Neuro/Psych  PSYCHIATRIC DISORDERS Anxiety Depression    negative neurological ROS     GI/Hepatic negative GI ROS, Neg liver ROS,,,  Endo/Other  Hypothyroidism    Renal/GU negative Renal ROS  negative genitourinary   Musculoskeletal negative musculoskeletal ROS (+)    Abdominal   Peds negative pediatric ROS (+)  Hematology  (+) Blood dyscrasia, anemia   Anesthesia Other Findings   Reproductive/Obstetrics negative OB ROS                             Anesthesia Physical Anesthesia Plan  ASA: 2  Anesthesia Plan: General   Post-op Pain Management: Dilaudid IV   Induction: Intravenous  PONV Risk Score and Plan: 4 or greater and Ondansetron, Dexamethasone, Midazolam and Scopolamine patch - Pre-op  Airway Management Planned: Oral ETT  Additional Equipment:   Intra-op Plan:   Post-operative Plan: Extubation in OR  Informed Consent: I have reviewed the patients History and Physical, chart, labs and discussed the procedure including the risks, benefits and alternatives for the proposed anesthesia with the patient or authorized representative who has indicated his/her understanding and acceptance.     Dental advisory given  Plan Discussed with: CRNA and Surgeon  Anesthesia Plan Comments:        Anesthesia Quick Evaluation

## 2022-07-03 NOTE — Anesthesia Postprocedure Evaluation (Signed)
Anesthesia Post Note  Patient: Hannah Osborn  Procedure(s) Performed: XI ROBOTIC ASSISTED LAPAROSCOPIC HYSTERECTOMY, TAP BLOCK (Abdomen)  Patient location during evaluation: Phase II Anesthesia Type: General Level of consciousness: awake and alert and oriented Pain management: pain level controlled Vital Signs Assessment: post-procedure vital signs reviewed and stable Respiratory status: spontaneous breathing, nonlabored ventilation and respiratory function stable Cardiovascular status: blood pressure returned to baseline and stable Postop Assessment: no apparent nausea or vomiting Anesthetic complications: no  No notable events documented.   Last Vitals:  Vitals:   07/03/22 1400 07/03/22 1412  BP: 116/78 121/82  Pulse: 91 78  Resp: (!) 26   Temp:  37 C  SpO2:  99%    Last Pain:  Vitals:   07/03/22 1412  TempSrc: Oral  PainSc: 5                  Charli Halle C Waleed Dettman

## 2022-07-03 NOTE — Op Note (Signed)
Preoperative diagnosis: Menorrhagia with regular cycle Fibroid uterus Dysmenorrhea Iron deficiency anemia due to menorrhagia Status post endometrial ablation in 2018   Postoperative diagnosis: SAA   Procedure: Xi Robotic total laparoscopic hysterectomy   Laparoscopic guided TAP block   Surgeon: Lazaro Arms, MD   Anesthesia: General endotracheal   Findings: Patient had a preoperative sonogram that revealed she had 122 cc uterus however thing that included the posterior myoma it was probably 3 times that size and as it was posterior it made it rise out of the pelvis  The ovaries  and peritoneal cavity was otherwise normal I removed the tubes in 2018   Description of operation: Patient was taken to the operating room and placed in the low lithotomy position She was prepped and draped in the usual sterile fashion robotic assisted laparoscopic procedure  A Foley catheter was placed The vagina was once again prepped additionally   A Rumi II was placed, with an 8 cm uterine attachment and a large colpotomy cup   An incision was then made above the umbilicus The umbilical fascia was grasped A Veres needle was used and placed into the peritoneum with 1 pass A pneumoperitoneum was created to a pressure of 15   An 8 mm port was placed into the peritoneal cavity using a nonbladed trocar easily with 1 pass using the video laparoscope The peritoneal cavity was confirmed   There were no unusual findings    3 additional 8 mm ports were placed at approximately the same level as the supraumbilical port 2 of the ports were robotic and 1 is an air seal assist port   One was left lateral and 1 was placed right lateral, both lateral to the rectus anterior muscle These were placed under direct visualization without difficulty into the peritoneal cavity Nonbladed trocars were used in all instances The air seal assist port was placed 8 cm to the left of the umbilical incision and 3 cm  above the umbilical incision   The patient was placed in 24 degrees of Trendelenburg   The [employer] robot was then docked to the 3 robotic ports   Instruments used during the robotic hysterectomy: Vessel sealer extender using bipolar energy ProGrasp forceps with no energy Monopolar hook Megacut needle driver 2-0 PDS symmetrical STRATAFIX on a CT 2 needle   I then left the patient and went to the surgical console   The Rumi II was used throughout the case to apply traction and anterior/posterior displacement of the uterus to facilitate the robotic procedure The ProGrasp was used to put traction on the left adnexa The left ureter was identified and found to be well away from the adnexal vessels The vessel sealer extender using bipolar energy was used and the utero-ovarian ligament was coagulated and then transected I used traction medially and anteriorly and used the vessel sealer to take the broad ligament and round ligament down to the level of the cervical isthmus just lateral to the left uterine vessels   I then turned my attention to the right adnexa The pro grasp was used and medial and upward traction was placed The vessel sealer extender using bipolar energy was used to coagulate and ligate the right infundibulopelvic ligament vessels Again the right ureter was well inferior to the vessels I continued use medial and anterior retraction using the ProGrasp and the vessel sealer with the bipolar energy was used to take the broad ligament and round ligament down to the level of  the cervical isthmus and lateral to the uterine vessels   I then placed the monopolar hook in the place of the ProGrasp The vessel sealer extender was used to grasp the peritoneum of the bladder provide traction, of course no energy was used for this I used the monopolar hook with energy to open of the peritoneal leaf anteriorly and dissect the bladder peritoneum off the lower uterine segment and cervix  beyond the level of the vaginal colpotomy cup I then used the monopolar hook to take the peritoneum down where I stopped using the vessel sealer and met in the bladder dissection bilaterally   The vessel sealer extender with bipolar energy was used to coagulate the uterine vessels at the level of the cervical isthmus bilaterally and then just above and just below to manage backbleeding The uterine vessels were transected There was good hemostasis I then stayed medial to this uterine vessel pedicle with the vessel sealer extender and took down the paracervical tissue to allow colpotomy incision using the monopolar hook, preserving the cardinal ligament Again there was good hemostasis bilaterally   I then used the monopolar hook and made a posterior colpotomy incision above the level of insertion of the uterosacral ligaments I used a frowny face then smiley face technique and opened the vagina to approximately 8:00 and 4:00 posteriorly I then used the vessel sealer extender with bipolar energy, coagulating and then transecting the vagina   The monopolar hook was used to make the anterior colpotomy incision as the bladder had been dissected well past the colpotomy ring Cephalad tension was placed on the Rumi II handle throughout this portion of the procedure to prevent thermal injury to the bladder and safe distance from the ureters bilaterally Colpotomy incision was made from 10:00 to 2:00 The vessel sealer with bipolar energy was then used to coagulate and transect the vagina, again inside of the cardinal ligament   The uterus was enlarged to be removed through the vagina which was not the preoperative plan I tried to morcellate through the vagina but was unsuccessful At that point my plan was to do an extracorporeal morcellation through enlarging the umbilical incision  The ovaries were thus preserved  A wet lap tape was placed in the vagina to maintain pneumoperitoneum   All pedicles were  found to be hemostatic there was very little blood loss I then removed the vessel sealer and monopolar hook The pro grasp was replaced and the needle driver was also placed along with the suture   The vagina was closed using the 2-0 PDS STRATAFIX symmetrical suture on the CT 2 needle I pay close attention to the vaginal corners bilaterally and incorporated those in the closure 1.5 cm of vagina was operating to the vaginal closure I also fixed the uterosacral ligaments to the vaginal cuff repair shortening the uterosacral ligaments The cardinal ligaments were intact incorporated into the vaginal angle closure again improving postoperative vaginal support Pubocervical rectovaginal and posterior peritoneum were all included in the vaginal closure There was good result hemostasis   At the end of the procedure all the pedicles were identified and found to be hemostatic Both ureters were identified and undergoing normal peristalsis, they were well out of the way of surgical field throughout   I then got up from the console and went back to the side of the patient after gowning and gloving sterilely  The robot was undocked The umbilical incision was extended  A small Alexis self-retaining retractor was placed  The uterus was grasped and intracorporeal morcellation of the uterus was performed without difficulty  I then closed the umbilical fascia and the subcutaneous tissue of the umbilicus The umbilicus was closed with 3-0 Vicryl in a subcuticular fashion with good hemostasis  I then used the additional laparoscopic ports to place a transversus abdominis plane block direct laparoscopic visualization The block was placed bilaterally at approximately T7 and T10 10 cc of 0.25% Marcaine was injected at each site using a Doyles bubble technique with drawing 1/2 cm to pick the proper plane Total of 40 cc was injected without difficulty The 2 remaining robotic ports were removed The air seal port was  used to actively desufflate the peritoneal cavity to a pressure of 0 Air seal port was then removed   The subcutaneous tissue of all 3 remaining incisions was closed with 3-0 Vicryl  Dermabond was used all 4 incision sites  Each incision was dressed   The patient tolerated the procedure well She received 2 g of Ancef and 30 mg of Toradol preoperatively   EBL: 20 cc Active consult time 65 minutes  Specimens were sent to pathology    Lazaro Arms, MD 07/03/2022 12:44 PM

## 2022-07-03 NOTE — Transfer of Care (Signed)
Immediate Anesthesia Transfer of Care Note  Patient: Hannah Osborn  Procedure(s) Performed: XI ROBOTIC ASSISTED LAPAROSCOPIC HYSTERECTOMY, TAP BLOCK (Abdomen)  Patient Location: PACU  Anesthesia Type:General  Level of Consciousness: awake, drowsy, and patient cooperative  Airway & Oxygen Therapy: Patient Spontanous Breathing and Patient connected to face mask oxygen  Post-op Assessment: Report given to RN and Post -op Vital signs reviewed and stable  Post vital signs: Reviewed and stable  Last Vitals:  Vitals Value Taken Time  BP 129/85 07/03/22 1231  Temp    Pulse 98 07/03/22 1234  Resp 14 07/03/22 1234  SpO2 100 % 07/03/22 1234  Vitals shown include unvalidated device data.  Last Pain:  Vitals:   07/03/22 0708  TempSrc: Oral  PainSc: 0-No pain      Patients Stated Pain Goal: 8 (07/03/22 0708)  Complications: No notable events documented.

## 2022-07-04 ENCOUNTER — Ambulatory Visit: Payer: BC Managed Care – PPO | Admitting: Obstetrics & Gynecology

## 2022-07-05 ENCOUNTER — Encounter (HOSPITAL_COMMUNITY): Payer: Self-pay | Admitting: Obstetrics & Gynecology

## 2022-07-05 LAB — SURGICAL PATHOLOGY

## 2022-07-06 ENCOUNTER — Encounter: Payer: Self-pay | Admitting: Obstetrics & Gynecology

## 2022-07-06 DIAGNOSIS — D219 Benign neoplasm of connective and other soft tissue, unspecified: Secondary | ICD-10-CM

## 2022-07-09 ENCOUNTER — Ambulatory Visit (INDEPENDENT_AMBULATORY_CARE_PROVIDER_SITE_OTHER): Payer: BC Managed Care – PPO | Admitting: Obstetrics & Gynecology

## 2022-07-09 ENCOUNTER — Encounter: Payer: Self-pay | Admitting: Obstetrics & Gynecology

## 2022-07-09 VITALS — BP 132/89 | HR 81 | Ht 63.0 in | Wt 204.0 lb

## 2022-07-09 DIAGNOSIS — Z9889 Other specified postprocedural states: Secondary | ICD-10-CM

## 2022-07-09 NOTE — Progress Notes (Signed)
  HPI: Patient returns for routine postoperative follow-up having undergone RA TLH  on 07/03/22.  The patient's immediate postoperative recovery has been unremarkable. Since hospital discharge the patient reports no problems.   Current Outpatient Medications: albuterol (VENTOLIN HFA) 108 (90 Base) MCG/ACT inhaler, Inhale 1-2 puffs into the lungs every 6 (six) hours as needed for wheezing or shortness of breath., Disp: 18 g, Rfl: 0 ALPRAZolam (XANAX) 0.5 MG tablet, Take 0.5 mg by mouth daily as needed for anxiety., Disp: , Rfl:  ciprofloxacin (CIPRO) 500 MG tablet, Take 1 tablet (500 mg total) by mouth 2 (two) times daily., Disp: 14 tablet, Rfl: 0 ibuprofen (ADVIL) 800 MG tablet, TAKE 1 TABLET BY MOUTH EVERY 8 HOURS AS NEEDED, Disp: 60 tablet, Rfl: 0 ketorolac (TORADOL) 10 MG tablet, Take 1 tablet (10 mg total) by mouth every 8 (eight) hours as needed., Disp: 15 tablet, Rfl: 0 lubiprostone (AMITIZA) 24 MCG capsule, Take 1 capsule (24 mcg total) by mouth 2 (two) times daily with a meal., Disp: 60 capsule, Rfl: 3 methocarbamol (ROBAXIN) 500 MG tablet, Take 1 tablet (500 mg total) by mouth 2 (two) times daily., Disp: 20 tablet, Rfl: 0 ondansetron (ZOFRAN-ODT) 8 MG disintegrating tablet, Take 1 tablet (8 mg total) by mouth every 8 (eight) hours as needed for nausea or vomiting., Disp: 8 tablet, Rfl: 0 oxyCODONE-acetaminophen (PERCOCET) 7.5-325 MG tablet, Take 1 tablet by mouth every 6 (six) hours as needed., Disp: 28 tablet, Rfl: 0 polyethylene glycol powder (GLYCOLAX/MIRALAX) 17 GM/SCOOP powder, 1 scoop daily or as needed, Disp: 255 g, Rfl: 11  No current facility-administered medications for this visit.    Blood pressure (!) 138/91, pulse 81, height 5\' 3"  (1.6 m), weight 204 lb (92.5 kg), last menstrual period 03/14/2022.  Physical Exam: Incisions x 4 all look good healing well Cuff intact non tender normal for 1 week post op  Diagnostic Tests:   Pathology: benign  Impression +  Management plan:   ICD-10-CM   1. Post-operative state: RA TLH with morcellation  Z98.890    normal post op course         Medications Prescribed this encounter: No orders of the defined types were placed in this encounter.     Follow up: 6 weeks post op   Lazaro Arms, MD Attending Physician for the Center for [facility] 07/09/2022 4:05 PM

## 2022-07-11 ENCOUNTER — Ambulatory Visit: Payer: BC Managed Care – PPO | Admitting: Obstetrics & Gynecology

## 2022-07-17 ENCOUNTER — Other Ambulatory Visit: Payer: Self-pay | Admitting: Obstetrics & Gynecology

## 2022-07-17 ENCOUNTER — Other Ambulatory Visit: Payer: Self-pay | Admitting: Adult Health

## 2022-07-19 ENCOUNTER — Encounter: Payer: Self-pay | Admitting: Obstetrics & Gynecology

## 2022-08-05 ENCOUNTER — Encounter: Payer: Self-pay | Admitting: Obstetrics & Gynecology

## 2022-08-09 ENCOUNTER — Ambulatory Visit (INDEPENDENT_AMBULATORY_CARE_PROVIDER_SITE_OTHER): Payer: BC Managed Care – PPO | Admitting: Obstetrics & Gynecology

## 2022-08-09 VITALS — BP 138/89 | HR 105 | Ht 63.0 in | Wt 205.0 lb

## 2022-08-09 DIAGNOSIS — Z9889 Other specified postprocedural states: Secondary | ICD-10-CM

## 2022-08-09 DIAGNOSIS — T8141XA Infection following a procedure, superficial incisional surgical site, initial encounter: Secondary | ICD-10-CM

## 2022-08-09 MED ORDER — KETOROLAC TROMETHAMINE 10 MG PO TABS: 10.0000 mg | ORAL_TABLET | Freq: Three times a day (TID) | ORAL | 0 refills | Status: DC | PRN

## 2022-08-09 MED ORDER — CEPHALEXIN 500 MG PO CAPS: 500.0000 mg | ORAL_CAPSULE | Freq: Three times a day (TID) | ORAL | 0 refills | Status: DC

## 2022-08-09 NOTE — Progress Notes (Signed)
  HPI: Patient returns for routine postoperative follow-up having undergone RA TLH BS on 07/03/22.  The patient's immediate postoperative recovery has been unremarkable. Since hospital discharge the patient reports tenderness around the umbilical incision and some drainage as well.  No N/V.   Current Outpatient Medications: albuterol (VENTOLIN HFA) 108 (90 Base) MCG/ACT inhaler, Inhale 1-2 puffs into the lungs every 6 (six) hours as needed for wheezing or shortness of breath., Disp: 18 g, Rfl: 0 ALPRAZolam (XANAX) 0.5 MG tablet, Take 0.5 mg by mouth daily as needed for anxiety., Disp: , Rfl:  cephALEXin (KEFLEX) 500 MG capsule, Take 1 capsule (500 mg total) by mouth 3 (three) times daily., Disp: 30 capsule, Rfl: 0 ibuprofen (ADVIL) 800 MG tablet, TAKE 1 TABLET BY MOUTH EVERY 8 HOURS AS NEEDED, Disp: 60 tablet, Rfl: 0 ketorolac (TORADOL) 10 MG tablet, TAKE 1 TABLET BY MOUTH EVERY 8 HOURS AS NEEDED, Disp: 15 tablet, Rfl: 0 ketorolac (TORADOL) 10 MG tablet, Take 1 tablet (10 mg total) by mouth every 8 (eight) hours as needed., Disp: 15 tablet, Rfl: 0 lubiprostone (AMITIZA) 24 MCG capsule, Take 1 capsule (24 mcg total) by mouth 2 (two) times daily with a meal., Disp: 60 capsule, Rfl: 3 methocarbamol (ROBAXIN) 500 MG tablet, Take 1 tablet (500 mg total) by mouth 2 (two) times daily., Disp: 20 tablet, Rfl: 0 oxyCODONE-acetaminophen (PERCOCET) 7.5-325 MG tablet, Take 1 tablet by mouth every 6 (six) hours as needed., Disp: 28 tablet, Rfl: 0 polyethylene glycol powder (GLYCOLAX/MIRALAX) 17 GM/SCOOP powder, 1 scoop daily or as needed, Disp: 255 g, Rfl: 11 ondansetron (ZOFRAN-ODT) 8 MG disintegrating tablet, Take 1 tablet (8 mg total) by mouth every 8 (eight) hours as needed for nausea or vomiting. (Patient not taking: Reported on 08/09/2022), Disp: 8 tablet, Rfl: 0  No current facility-administered medications for this visit.    Blood pressure 138/89, pulse (!) 105, height 5\' 3"  (1.6 m), weight 205 lb (93  kg), last menstrual period 03/14/2022.  Physical Exam: All incisions clean dry intact Abd soft benign Incisions look good She is spitting out a stitch, maybe it was a stitch abscess No hernia with provocation noted  Diagnostic Tests:   Pathology: benign  Impression + Management plan:   ICD-10-CM   1. Post-operative state: RA TLH with morcellation 07/03/2022  Z98.890     2. Postoperative stitch abscess, possible: keflex 500 TID x 10 days  T81.41XA    keep follow up         Medications Prescribed this encounter: Orders Placed This Encounter     cephALEXin (KEFLEX) 500 MG capsule         Sig: Take 1 capsule (500 mg total) by mouth 3 (three) times daily.         Dispense:  30 capsule         Refill:  0     ketorolac (TORADOL) 10 MG tablet         Sig: Take 1 tablet (10 mg total) by mouth every 8 (eight) hours as needed.         Dispense:  15 tablet         Refill:  0     Follow up: No follow-ups on file.    Lazaro Arms, MD Attending Physician for the Center for [facility] 08/09/2022 9:20 AM

## 2022-08-12 ENCOUNTER — Encounter: Payer: Self-pay | Admitting: Diagnostic Neuroimaging

## 2022-08-12 ENCOUNTER — Ambulatory Visit (INDEPENDENT_AMBULATORY_CARE_PROVIDER_SITE_OTHER): Payer: BC Managed Care – PPO | Admitting: Diagnostic Neuroimaging

## 2022-08-12 VITALS — BP 116/82 | HR 89 | Ht 63.0 in | Wt 205.0 lb

## 2022-08-12 DIAGNOSIS — R200 Anesthesia of skin: Secondary | ICD-10-CM

## 2022-08-12 DIAGNOSIS — R42 Dizziness and giddiness: Secondary | ICD-10-CM

## 2022-08-12 NOTE — Progress Notes (Signed)
[facility]  PATIENT: Hannah Osborn DOB: Jan 04, 1986  REFERRING CLINICIAN: John Giovanni, MD HISTORY FROM: patient  REASON FOR VISIT: new consult   HISTORICAL  CHIEF COMPLAINT:  Chief Complaint  Patient presents with   New Patient (Initial Visit)    Patient in room #7 and alone. Patient states she has numbness on side of her body. Patient states it travel from her head and down left side of her arm then body. Patient this happen when she sit for a long time then stand.    HISTORY OF PRESENT ILLNESS:   37 year old female with dizziness and numbness.  Since ~2020, has intermittent lightheaded / dizzy sensations when standing after long time sitting. No vertigo or weakness. Has had tunnel vision. No syncope. Sometimes has numbness in left hand (arm, fingers up to shoulder). Episodes happen ~2x per week, lasting 5 minutes at a time. Had 1 event of brief muscle spasms in the left arm with this type of attack.   REVIEW OF SYSTEMS: Full 14 system review of systems performed and negative with exception of: as per HPI.  ALLERGIES: No Known Allergies  HOME MEDICATIONS: Outpatient Medications Prior to Visit  Medication Sig Dispense Refill   albuterol (VENTOLIN HFA) 108 (90 Base) MCG/ACT inhaler Inhale 1-2 puffs into the lungs every 6 (six) hours as needed for wheezing or shortness of breath. 18 g 0   ALPRAZolam (XANAX) 0.5 MG tablet Take 0.5 mg by mouth daily as needed for anxiety.     cephALEXin (KEFLEX) 500 MG capsule Take 1 capsule (500 mg total) by mouth 3 (three) times daily. 30 capsule 0   ibuprofen (ADVIL) 800 MG tablet TAKE 1 TABLET BY MOUTH EVERY 8 HOURS AS NEEDED 60 tablet 0   ketorolac (TORADOL) 10 MG tablet TAKE 1 TABLET BY MOUTH EVERY 8 HOURS AS NEEDED 15 tablet 0   ketorolac (TORADOL) 10 MG tablet Take 1 tablet (10 mg total) by mouth every 8 (eight) hours as needed. 15 tablet 0   lubiprostone (AMITIZA) 24 MCG capsule Take 1 capsule (24 mcg total)  by mouth 2 (two) times daily with a meal. 60 capsule 3   methocarbamol (ROBAXIN) 500 MG tablet Take 1 tablet (500 mg total) by mouth 2 (two) times daily. 20 tablet 0   oxyCODONE-acetaminophen (PERCOCET) 7.5-325 MG tablet Take 1 tablet by mouth every 6 (six) hours as needed. 28 tablet 0   polyethylene glycol powder (GLYCOLAX/MIRALAX) 17 GM/SCOOP powder 1 scoop daily or as needed 255 g 11   ondansetron (ZOFRAN-ODT) 8 MG disintegrating tablet Take 1 tablet (8 mg total) by mouth every 8 (eight) hours as needed for nausea or vomiting. (Patient not taking: Reported on 08/09/2022) 8 tablet 0   No facility-administered medications prior to visit.    PAST MEDICAL HISTORY: Past Medical History:  Diagnosis Date   Allergy    Anemia    Anxiety    Anxiety and depression 03/21/2020   COVID-19    Fibroid 01/08/2016   Hypothyroid 04/03/2017   Hypothyroidism    Screen for STD (sexually transmitted disease) 10/26/2020   Thyroid disease     PAST SURGICAL HISTORY: Past Surgical History:  Procedure Laterality Date   BIOPSY  11/29/2021   Procedure: BIOPSY;  Surgeon: Lanelle Bal, DO;  [facility];  Service: Endoscopy;;   CHOLECYSTECTOMY     COLONOSCOPY WITH PROPOFOL N/A 11/29/2021   Procedure: COLONOSCOPY WITH PROPOFOL;  Surgeon: Lanelle Bal, DO;  [facility];  Service: Endoscopy;  Laterality: N/A;  2:15 pm, pt knows to arrive at 10:15   DILITATION & CURRETTAGE/HYSTROSCOPY WITH NOVASURE ABLATION N/A 02/02/2016   Procedure: DILATATION & CURETTAGE/HYSTEROSCOPY WITH NOVASURE ABLATION (procedure 2);  Surgeon: Lazaro Arms, MD;  Location: [facility];  Service: Gynecology;  Laterality: N/A;   ESOPHAGOGASTRODUODENOSCOPY (EGD) WITH PROPOFOL N/A 11/29/2021   Procedure: ESOPHAGOGASTRODUODENOSCOPY (EGD) WITH PROPOFOL;  Surgeon: Lanelle Bal, DO;  [facility];  Service: Endoscopy;  Laterality: N/A;   HEMORRHOID SURGERY N/A 05/01/2016   Procedure: EXTENSIVE  HEMORRHOIDECTOMY;  Surgeon: Franky Macho, MD;  Location: [facility];  Service: General;  Laterality: N/A;   LAPAROSCOPIC BILATERAL SALPINGECTOMY Bilateral 02/02/2016   Procedure: LAPAROSCOPIC BILATERAL SALPINGECTOMY (procedure #1);  Surgeon: Lazaro Arms, MD;  Location: [facility];  Service: Gynecology;  Laterality: Bilateral;   ROBOTIC ASSISTED LAPAROSCOPIC HYSTERECTOMY AND SALPINGECTOMY N/A 07/03/2022   Procedure: XI ROBOTIC ASSISTED LAPAROSCOPIC HYSTERECTOMY, TAP BLOCK;  Surgeon: Lazaro Arms, MD;  Location: [facility];  Service: Gynecology;  Laterality: N/A;   TONSILLECTOMY     TUBAL LIGATION      FAMILY HISTORY: Family History  Problem Relation Age of Onset   Pulmonary embolism Mother    Early death Mother    Cancer Father        stomach   Early death Father    Asthma Son    Allergies Son    Heart disease Maternal Uncle    Cancer Maternal Grandmother        uterine   Cancer Maternal Grandfather        colon    SOCIAL HISTORY: Social History   Socioeconomic History   Marital status: Single    Spouse name: Not on file   Number of children: 1   Years of education: 12   Highest education level: Not on file  Occupational History   Occupation: dietary aid    Employer: [employer]  Tobacco Use   Smoking status: Never   Smokeless tobacco: Never  Vaping Use   Vaping status: Never Used  Substance and Sexual Activity   Alcohol use: Yes    Comment: occasional   Drug use: No   Sexual activity: Yes    Partners: Male    Birth control/protection: Surgical    Comment: tubal and ablation  Other Topics Concern   Not on file  Social History Narrative   Lives with son Denny Peon      Works in Surveyor, mining at [employer]   Social Determinants of Health   Financial Resource Strain: Low Risk  (03/21/2020)   Overall Financial Resource Strain (CARDIA)    Difficulty of Paying Living Expenses: Not hard at all  Food Insecurity: No Food Insecurity (03/21/2020)   Hunger Vital Sign     Worried About Running Out of Food in the Last Year: Never true    Ran Out of Food in the Last Year: Never true  Transportation Needs: No Transportation Needs (03/21/2020)   PRAPARE - Administrator, Civil Service (Medical): No    Lack of Transportation (Non-Medical): No  Physical Activity: Sufficiently Active (03/21/2020)   Exercise Vital Sign    Days of Exercise per Week: 6 days    Minutes of Exercise per Session: 100 min  Stress: Stress Concern Present (03/21/2020)   [employer] of Occupational Health - Occupational Stress Questionnaire    Feeling of Stress : To some extent  Social Connections: Socially Isolated (03/21/2020)  Social Advertising account executive [NHANES]    Frequency of Communication with Friends and Family: More than three times a week    Frequency of Social Gatherings with Friends and Family: Never    Attends Religious Services: Never    Database administrator or Organizations: No    Attends Banker Meetings: Never    Marital Status: Never married  Intimate Partner Violence: Not At Risk (03/21/2020)   Humiliation, Afraid, Rape, and Kick questionnaire    Fear of Current or Ex-Partner: No    Emotionally Abused: No    Physically Abused: No    Sexually Abused: No     PHYSICAL EXAM  GENERAL EXAM/CONSTITUTIONAL: Vitals:  Vitals:   08/12/22 0759  BP: 116/82  Pulse: 89  Weight: 205 lb (93 kg)  Height: 5\' 3"  (1.6 m)  No data found.  Body mass index is 36.31 kg/m. Wt Readings from Last 3 Encounters:  08/12/22 205 lb (93 kg)  08/09/22 205 lb (93 kg)  07/09/22 204 lb (92.5 kg)   Patient is in no distress; well developed, nourished and groomed; neck is supple  CARDIOVASCULAR: Examination of carotid arteries is normal; no carotid bruits Regular rate and rhythm, no murmurs Examination of peripheral vascular system by observation and palpation is normal  EYES: Ophthalmoscopic exam of optic discs and posterior segments is normal;  no papilledema or hemorrhages No results found.  MUSCULOSKELETAL: Gait, strength, tone, movements noted in Neurologic exam below  NEUROLOGIC: MENTAL STATUS:      No data to display         awake, alert, oriented to person, place and time recent and remote memory intact normal attention and concentration language fluent, comprehension intact, naming intact fund of knowledge appropriate  CRANIAL NERVE:  2nd - no papilledema on fundoscopic exam 2nd, 3rd, 4th, 6th - pupils equal and reactive to light, visual fields full to confrontation, extraocular muscles intact, no nystagmus 5th - facial sensation symmetric 7th - facial strength symmetric 8th - hearing intact 9th - palate elevates symmetrically, uvula midline 11th - shoulder shrug symmetric 12th - tongue protrusion midline  MOTOR:  normal bulk and tone, full strength in the BUE, BLE  SENSORY:  normal and symmetric to light touch, pinprick, temperature, vibration  COORDINATION:  finger-nose-finger, fine finger movements normal  REFLEXES:  deep tendon reflexes 1+ and symmetric  GAIT/STATION:  narrow based gait     DIAGNOSTIC DATA (LABS, IMAGING, TESTING) - I reviewed patient records, labs, notes, testing and imaging myself where available.  Lab Results  Component Value Date   WBC 7.4 06/28/2022   HGB 10.0 (L) 06/28/2022   HCT 32.7 (L) 06/28/2022   MCV 64.2 (L) 06/28/2022   PLT 367 06/28/2022      Component Value Date/Time   NA 137 06/28/2022 0819   NA 146 (H) 07/22/2018 1624   K 3.3 (L) 06/28/2022 0819   CL 104 06/28/2022 0819   CO2 24 06/28/2022 0819   GLUCOSE 82 06/28/2022 0819   BUN 16 06/28/2022 0819   BUN 13 07/22/2018 1624   CREATININE 0.73 06/28/2022 0819   CREATININE 0.87 12/18/2015 1551   CALCIUM 9.1 06/28/2022 0819   PROT 7.1 06/28/2022 0819   PROT 6.7 07/22/2018 1624   ALBUMIN 3.8 06/28/2022 0819   ALBUMIN 4.3 07/22/2018 1624   AST 14 (L) 06/28/2022 0819   ALT 16 06/28/2022 0819    ALKPHOS 48 06/28/2022 0819   BILITOT 0.3 06/28/2022 0819   BILITOT <0.2 07/22/2018  1624   GFRNONAA >60 06/28/2022 0819   GFRNONAA >89 12/18/2015 1551   GFRAA >60 04/15/2019 1601   GFRAA >89 12/18/2015 1551   Lab Results  Component Value Date   CHOL 227 (H) 04/01/2017   HDL 78 04/01/2017   LDLCALC 138 (H) 04/01/2017   TRIG 55 04/01/2017   CHOLHDL 2.9 04/01/2017   Lab Results  Component Value Date   HGBA1C 4.9 12/18/2015   No results found for: "VITAMINB12" Lab Results  Component Value Date   TSH 3.440 07/22/2018      ASSESSMENT AND PLAN  37 y.o. year old female here with:   Dx:  1. Orthostatic dizziness   2. Left arm numbness      PLAN:  INTERMITTENT LEFT ARM NUMBNESS (since ~2020; up to 2x per week; 5-10 minutes; possible intermittent nerve compression of ulnar nerve at the elbow) - normal neuro exam today; monitor symptoms; avoid pressure at left elbow or left wrist - if sxs worsen, then consider EMG/NCS  INTERMITTENT ORTHOSTATIC DIZZINESS - optimize nutrition, hydration, sleep - if sxs worsen, consider MRI brain w/wo  Return for pending if symptoms worsen or fail to improve.    Suanne Marker, MD 08/12/2022, 9:14 AM Certified in Neurology, Neurophysiology and Neuroimaging  [facility] [address], [address] [place], [place] [address] [phone]

## 2022-08-12 NOTE — Patient Instructions (Signed)
  INTERMITTENT LEFT ARM NUMBNESS (since ~2020; up to 2x per week; 5-10 minutes; possible intermittent nerve compression of ulnar nerve at the elbow) - normal neuro exam today; monitor symptoms; avoid pressure at left elbow or left wrist - if sxs worsen, then consider EMG/NCS  INTERMITTENT ORTHOSTATIC DIZZINESS - optimize nutrition, hydration, sleep - if sxs worsen, consider MRI brain w/wo

## 2022-08-20 ENCOUNTER — Encounter: Payer: Self-pay | Admitting: Obstetrics & Gynecology

## 2022-08-20 ENCOUNTER — Ambulatory Visit (INDEPENDENT_AMBULATORY_CARE_PROVIDER_SITE_OTHER): Payer: BC Managed Care – PPO | Admitting: Obstetrics & Gynecology

## 2022-08-20 VITALS — BP 127/83 | HR 90 | Ht 63.0 in | Wt 210.0 lb

## 2022-08-20 DIAGNOSIS — Z9889 Other specified postprocedural states: Secondary | ICD-10-CM

## 2022-08-20 NOTE — Progress Notes (Signed)
  HPI: Patient returns for routine postoperative follow-up having undergone RA TLH + bilateral salpingectomy on 07/03/22.  The patient's immediate postoperative recovery has been unremarkable. Since hospital discharge the patient reports no problems, umbilical incision tenderness has resolved.   Current Outpatient Medications: albuterol (VENTOLIN HFA) 108 (90 Base) MCG/ACT inhaler, Inhale 1-2 puffs into the lungs every 6 (six) hours as needed for wheezing or shortness of breath., Disp: 18 g, Rfl: 0 ALPRAZolam (XANAX) 0.5 MG tablet, Take 0.5 mg by mouth daily as needed for anxiety., Disp: , Rfl:  lubiprostone (AMITIZA) 24 MCG capsule, Take 1 capsule (24 mcg total) by mouth 2 (two) times daily with a meal., Disp: 60 capsule, Rfl: 3 methocarbamol (ROBAXIN) 500 MG tablet, Take 1 tablet (500 mg total) by mouth 2 (two) times daily., Disp: 20 tablet, Rfl: 0 polyethylene glycol powder (GLYCOLAX/MIRALAX) 17 GM/SCOOP powder, 1 scoop daily or as needed, Disp: 255 g, Rfl: 11 ondansetron (ZOFRAN-ODT) 8 MG disintegrating tablet, Take 1 tablet (8 mg total) by mouth every 8 (eight) hours as needed for nausea or vomiting. (Patient not taking: Reported on 08/09/2022), Disp: 8 tablet, Rfl: 0  No current facility-administered medications for this visit.    Blood pressure 127/83, pulse 90, height 5\' 3"  (1.6 m), weight 210 lb (95.3 kg), last menstrual period 03/14/2022.  Physical Exam: All incisions well healed non tender  Abdomen soft non tender Cuff intact healing well  Diagnostic Tests:   Pathology: Benign 322 cc  Impression + Management plan:   ICD-10-CM   1. Post-operative state: RA TLH with morcellation 07/03/2022  Z98.890          Medications Prescribed this encounter: No orders of the defined types were placed in this encounter.     Follow up: prn    Lazaro Arms, MD Attending Physician for the Center for [facility] and [facility] 08/20/2022 4:20 PM

## 2022-08-22 ENCOUNTER — Encounter: Payer: Self-pay | Admitting: Family Medicine

## 2022-08-22 ENCOUNTER — Ambulatory Visit (INDEPENDENT_AMBULATORY_CARE_PROVIDER_SITE_OTHER): Payer: BC Managed Care – PPO | Admitting: Family Medicine

## 2022-08-22 VITALS — BP 121/81 | HR 95 | Ht 63.0 in | Wt 206.0 lb

## 2022-08-22 DIAGNOSIS — Z131 Encounter for screening for diabetes mellitus: Secondary | ICD-10-CM

## 2022-08-22 DIAGNOSIS — Z1329 Encounter for screening for other suspected endocrine disorder: Secondary | ICD-10-CM

## 2022-08-22 DIAGNOSIS — D509 Iron deficiency anemia, unspecified: Secondary | ICD-10-CM

## 2022-08-22 DIAGNOSIS — E559 Vitamin D deficiency, unspecified: Secondary | ICD-10-CM

## 2022-08-22 DIAGNOSIS — F419 Anxiety disorder, unspecified: Secondary | ICD-10-CM | POA: Diagnosis not present

## 2022-08-22 DIAGNOSIS — Z13220 Encounter for screening for lipoid disorders: Secondary | ICD-10-CM

## 2022-08-22 MED ORDER — PAROXETINE HCL 10 MG PO TABS
10.0000 mg | ORAL_TABLET | Freq: Every evening | ORAL | 1 refills | Status: DC
Start: 2022-08-22 — End: 2022-09-19

## 2022-08-22 MED ORDER — ALBUTEROL SULFATE HFA 108 (90 BASE) MCG/ACT IN AERS: 1.0000 | INHALATION_SPRAY | Freq: Four times a day (QID) | RESPIRATORY_TRACT | 3 refills | Status: DC | PRN

## 2022-08-22 NOTE — Progress Notes (Deleted)
   New Patient Office Visit   Subjective   Patient ID: Hannah Osborn, female    DOB: Dec 20, 1985  Age: 37 y.o. MRN: [id number]  CC: No chief complaint on file.   HPI Hannah Osborn 37 year old female, presents to establish care. She  has a past medical history of Allergy, Anemia, Anxiety, Anxiety and depression (03/21/2020), COVID-19, Fibroid (01/08/2016), Hypothyroid (04/03/2017), Hypothyroidism, Screen for STD (sexually transmitted disease) (10/26/2020), and Thyroid disease.  HPI    Outpatient Encounter Medications as of 08/22/2022  Medication Sig   albuterol (VENTOLIN HFA) 108 (90 Base) MCG/ACT inhaler Inhale 1-2 puffs into the lungs every 6 (six) hours as needed for wheezing or shortness of breath.   ALPRAZolam (XANAX) 0.5 MG tablet Take 0.5 mg by mouth daily as needed for anxiety.   lubiprostone (AMITIZA) 24 MCG capsule Take 1 capsule (24 mcg total) by mouth 2 (two) times daily with a meal.   methocarbamol (ROBAXIN) 500 MG tablet Take 1 tablet (500 mg total) by mouth 2 (two) times daily.   ondansetron (ZOFRAN-ODT) 8 MG disintegrating tablet Take 1 tablet (8 mg total) by mouth every 8 (eight) hours as needed for nausea or vomiting. (Patient not taking: Reported on 08/09/2022)   polyethylene glycol powder (GLYCOLAX/MIRALAX) 17 GM/SCOOP powder 1 scoop daily or as needed   No facility-administered encounter medications on file as of 08/22/2022.    Past Surgical History:  Procedure Laterality Date   BIOPSY  11/29/2021   Procedure: BIOPSY;  Surgeon: Lanelle Bal, DO;  [facility];  Service: Endoscopy;;   CHOLECYSTECTOMY     COLONOSCOPY WITH PROPOFOL N/A 11/29/2021   Procedure: COLONOSCOPY WITH PROPOFOL;  Surgeon: Lanelle Bal, DO;  [facility];  Service: Endoscopy;  Laterality: N/A;  2:15 pm, pt knows to arrive at 10:15   DILITATION & CURRETTAGE/HYSTROSCOPY WITH NOVASURE ABLATION N/A 02/02/2016   Procedure: DILATATION & CURETTAGE/HYSTEROSCOPY WITH  NOVASURE ABLATION (procedure 2);  Surgeon: Lazaro Arms, MD;  [facility]: [facility];  Service: Gynecology;  Laterality: N/A;   ESOPHAGOGASTRODUODENOSCOPY (EGD) WITH PROPOFOL N/A 11/29/2021   Procedure: ESOPHAGOGASTRODUODENOSCOPY (EGD) WITH PROPOFOL;  Surgeon: Lanelle Bal, DO;  [facility];  Service: Endoscopy;  Laterality: N/A;   HEMORRHOID SURGERY N/A 05/01/2016   Procedure: EXTENSIVE HEMORRHOIDECTOMY;  Surgeon: Franky Macho, MD;  Location: [facility];  Service: General;  Laterality: N/A;   LAPAROSCOPIC BILATERAL SALPINGECTOMY Bilateral 02/02/2016   Procedure: LAPAROSCOPIC BILATERAL SALPINGECTOMY (procedure #1);  Surgeon: Lazaro Arms, MD;  Location: [facility];  Service: Gynecology;  Laterality: Bilateral;   ROBOTIC ASSISTED LAPAROSCOPIC HYSTERECTOMY AND SALPINGECTOMY N/A 07/03/2022   Procedure: XI ROBOTIC ASSISTED LAPAROSCOPIC HYSTERECTOMY, TAP BLOCK;  Surgeon: Lazaro Arms, MD;  Location: [facility];  Service: Gynecology;  Laterality: N/A;   TONSILLECTOMY     TUBAL LIGATION      ROS    Objective    LMP 03/14/2022 (Approximate)   Physical Exam    Assessment & Plan:  Screening for lipid disorders  Screening for diabetes mellitus  Screening for thyroid disorder  Vitamin D deficiency    No follow-ups on file.   Cruzita Lederer Newman Nip, FNP

## 2022-08-22 NOTE — Progress Notes (Signed)
New Patient Office Visit   Subjective   Patient ID: Hannah Osborn, female    DOB: Jun 24, 1985  Age: 37 y.o. MRN: [id number]  CC:  Chief Complaint  Patient presents with   Establish Care    Patient is here to establish care. Wants to discuss weight loss options.     HPI Hannah Osborn 37 year old female, presents to establish care. She  has a past medical history of Allergy, Anemia, Anxiety, Anxiety and depression (03/21/2020), COVID-19, Fibroid (01/08/2016), Hypothyroid (04/03/2017), Hypothyroidism, Screen for STD (sexually transmitted disease) (10/26/2020), and Thyroid disease.  Anxiety Presents for initial visit. Anxiety  has been gradually worsening. Symptoms include excessive worry, muscle tension, nervous/anxious behavior, restlessness and shortness of breath. Symptoms occur constantly. The severity of symptoms is interfering with daily activities. The symptoms are aggravated by social activities. The patient sleeps 5 hours per night. The quality of sleep is poor. Nighttime awakenings: several. Risk factors include a major life event and prior traumatic experience. Her past medical history is significant for anxiety/panic attacks. Past treatments include lifestyle changes and SSRIs. The treatment provided mild relief. Compliance with prior treatments has been variable. Prior compliance problems include insurance issues.        Outpatient Encounter Medications as of 08/22/2022  Medication Sig   ALPRAZolam (XANAX) 0.5 MG tablet Take 0.5 mg by mouth daily as needed for anxiety.   lubiprostone (AMITIZA) 24 MCG capsule Take 1 capsule (24 mcg total) by mouth 2 (two) times daily with a meal.   methocarbamol (ROBAXIN) 500 MG tablet Take 1 tablet (500 mg total) by mouth 2 (two) times daily.   PARoxetine (PAXIL) 10 MG tablet Take 1 tablet (10 mg total) by mouth at bedtime.   polyethylene glycol powder (GLYCOLAX/MIRALAX) 17 GM/SCOOP powder 1 scoop daily or as needed   [DISCONTINUED]  albuterol (VENTOLIN HFA) 108 (90 Base) MCG/ACT inhaler Inhale 1-2 puffs into the lungs every 6 (six) hours as needed for wheezing or shortness of breath.   albuterol (VENTOLIN HFA) 108 (90 Base) MCG/ACT inhaler Inhale 1-2 puffs into the lungs every 6 (six) hours as needed for wheezing or shortness of breath.   ondansetron (ZOFRAN-ODT) 8 MG disintegrating tablet Take 1 tablet (8 mg total) by mouth every 8 (eight) hours as needed for nausea or vomiting. (Patient not taking: Reported on 08/22/2022)   No facility-administered encounter medications on file as of 08/22/2022.    Past Surgical History:  Procedure Laterality Date   BIOPSY  11/29/2021   Procedure: BIOPSY;  Surgeon: Lanelle Bal, DO;  [facility];  Service: Endoscopy;;   CHOLECYSTECTOMY     COLONOSCOPY WITH PROPOFOL N/A 11/29/2021   Procedure: COLONOSCOPY WITH PROPOFOL;  Surgeon: Lanelle Bal, DO;  [facility];  Service: Endoscopy;  Laterality: N/A;  2:15 pm, pt knows to arrive at 10:15   DILITATION & CURRETTAGE/HYSTROSCOPY WITH NOVASURE ABLATION N/A 02/02/2016   Procedure: DILATATION & CURETTAGE/HYSTEROSCOPY WITH NOVASURE ABLATION (procedure 2);  Surgeon: Lazaro Arms, MD;  Location: [facility];  Service: Gynecology;  Laterality: N/A;   ESOPHAGOGASTRODUODENOSCOPY (EGD) WITH PROPOFOL N/A 11/29/2021   Procedure: ESOPHAGOGASTRODUODENOSCOPY (EGD) WITH PROPOFOL;  Surgeon: Lanelle Bal, DO;  [facility];  Service: Endoscopy;  Laterality: N/A;   HEMORRHOID SURGERY N/A 05/01/2016   Procedure: EXTENSIVE HEMORRHOIDECTOMY;  Surgeon: Franky Macho, MD;  Location: [facility];  Service: General;  Laterality: N/A;   LAPAROSCOPIC BILATERAL SALPINGECTOMY Bilateral 02/02/2016   Procedure: LAPAROSCOPIC BILATERAL SALPINGECTOMY (procedure #  1);  Surgeon: Lazaro Arms, MD;  Location: [facility];  Service: Gynecology;  Laterality: Bilateral;   ROBOTIC ASSISTED LAPAROSCOPIC HYSTERECTOMY AND SALPINGECTOMY N/A 07/03/2022   Procedure:  XI ROBOTIC ASSISTED LAPAROSCOPIC HYSTERECTOMY, TAP BLOCK;  Surgeon: Lazaro Arms, MD;  Location: [facility];  Service: Gynecology;  Laterality: N/A;   TONSILLECTOMY     TUBAL LIGATION      Review of Systems  Constitutional:  Negative for chills and fever.  Eyes:  Negative for blurred vision.  Respiratory:  Negative for shortness of breath.   Cardiovascular:  Negative for chest pain.  Neurological:  Negative for dizziness and headaches.  Psychiatric/Behavioral:  The patient is nervous/anxious.       Objective    BP 121/81   Pulse 95   Ht 5\' 3"  (1.6 m)   Wt 206 lb (93.4 kg)   LMP 03/14/2022 (Approximate)   SpO2 97%   BMI 36.49 kg/m   Physical Exam Vitals reviewed.  Constitutional:      General: She is not in acute distress.    Appearance: Normal appearance. She is not ill-appearing, toxic-appearing or diaphoretic.  HENT:     Head: Normocephalic.     Right Ear: Tympanic membrane normal.     Left Ear: Tympanic membrane normal.     Nose: Nose normal.     Mouth/Throat:     Mouth: Mucous membranes are moist.  Eyes:     General:        Right eye: No discharge.        Left eye: No discharge.     Extraocular Movements: Extraocular movements intact.     Conjunctiva/sclera: Conjunctivae normal.     Pupils: Pupils are equal, round, and reactive to light.  Cardiovascular:     Rate and Rhythm: Normal rate.     Pulses: Normal pulses.     Heart sounds: Normal heart sounds.  Pulmonary:     Effort: Pulmonary effort is normal. No respiratory distress.     Breath sounds: Normal breath sounds.  Abdominal:     General: Bowel sounds are normal.     Palpations: Abdomen is soft.     Tenderness: There is no abdominal tenderness. There is no right CVA tenderness, left CVA tenderness or guarding.  Musculoskeletal:        General: Normal range of motion.     Cervical back: Normal range of motion.  Skin:    General: Skin is warm and dry.     Capillary Refill: Capillary refill takes less  than 2 seconds.  Neurological:     General: No focal deficit present.     Mental Status: She is alert and oriented to person, place, and time.     Coordination: Coordination normal.     Gait: Gait normal.  Psychiatric:        Mood and Affect: Mood normal.        Behavior: Behavior normal.       Assessment & Plan:  Anxiety Assessment & Plan:    08/22/2022    3:59 PM 03/29/2021    3:52 PM 02/15/2021    4:11 PM 12/21/2020    3:51 PM  GAD 7 : Generalized Anxiety Score  Nervous, Anxious, on Edge 3 2 3 3   Control/stop worrying 3 2 3 3   Worry too much - different things 2 3 3 3   Trouble relaxing 1 3 3 3   Restless 0 2 1 1   Easily annoyed or irritable 1 1 2  2  Afraid - awful might happen 3 3 3 3   Total GAD 7 Score 13 16 18 18   Anxiety Difficulty Not difficult at all   Somewhat difficult    Trial on Paxil 10 mg at bedtime Continued discussion on lifestyle changes, establishing a daily routine, going outdoors, exercise, healthy eating habits, mindfulness and mediatation.  Follow up in 8 weeks  Orders: -     PARoxetine HCl; Take 1 tablet (10 mg total) by mouth at bedtime.  Dispense: 30 tablet; Refill: 1  Screening for lipid disorders -     CBC with Differential/Platelet -     BMP8+EGFR -     Lipid panel  Screening for diabetes mellitus -     Hemoglobin A1c -     Microalbumin / creatinine urine ratio  Screening for thyroid disorder -     TSH + free T4 -     Thyroid Peroxidase Antibodies (TPO) (REFL) -     Thyroid peroxidase antibody  Vitamin D deficiency -     VITAMIN D 25 Hydroxy (Vit-D Deficiency, Fractures)  Iron deficiency anemia, unspecified iron deficiency anemia type -     Iron, TIBC and Ferritin Panel -     B12 and Folate Panel  Other orders -     Albuterol Sulfate HFA; Inhale 1-2 puffs into the lungs every 6 (six) hours as needed for wheezing or shortness of breath.  Dispense: 8 g; Refill: 3    Return in 3 weeks (on 09/12/2022), or if symptoms worsen or fail to  improve, for 3 weeks for Weight Loss Mangment, 8 weeks for Anxiety managment.   Cruzita Lederer Newman Nip, FNP

## 2022-08-22 NOTE — Assessment & Plan Note (Signed)
    08/22/2022    3:59 PM 03/29/2021    3:52 PM 02/15/2021    4:11 PM 12/21/2020    3:51 PM  GAD 7 : Generalized Anxiety Score  Nervous, Anxious, on Edge 3 2 3 3   Control/stop worrying 3 2 3 3   Worry too much - different things 2 3 3 3   Trouble relaxing 1 3 3 3   Restless 0 2 1 1   Easily annoyed or irritable 1 1 2 2   Afraid - awful might happen 3 3 3 3   Total GAD 7 Score 13 16 18 18   Anxiety Difficulty Not difficult at all   Somewhat difficult    Trial on Paxil 10 mg at bedtime Continued discussion on lifestyle changes, establishing a daily routine, going outdoors, exercise, healthy eating habits, mindfulness and mediatation.  Follow up in 8 weeks

## 2022-08-22 NOTE — Patient Instructions (Signed)

## 2022-08-25 ENCOUNTER — Other Ambulatory Visit: Payer: Self-pay | Admitting: Family Medicine

## 2022-08-25 DIAGNOSIS — D509 Iron deficiency anemia, unspecified: Secondary | ICD-10-CM

## 2022-08-25 MED ORDER — ROSUVASTATIN CALCIUM 10 MG PO TABS: 10.0000 mg | ORAL_TABLET | Freq: Every day | ORAL | 3 refills | Status: AC

## 2022-09-11 ENCOUNTER — Encounter: Payer: Self-pay | Admitting: Obstetrics & Gynecology

## 2022-09-12 ENCOUNTER — Inpatient Hospital Stay: Payer: BC Managed Care – PPO | Admitting: Hematology

## 2022-09-12 ENCOUNTER — Ambulatory Visit: Payer: BC Managed Care – PPO | Admitting: Family Medicine

## 2022-09-17 ENCOUNTER — Inpatient Hospital Stay: Payer: BC Managed Care – PPO | Attending: Hematology | Admitting: Hematology

## 2022-09-17 VITALS — BP 131/89 | HR 104 | Temp 98.5°F | Resp 18 | Ht 63.0 in | Wt 210.0 lb

## 2022-09-17 DIAGNOSIS — Z832 Family history of diseases of the blood and blood-forming organs and certain disorders involving the immune mechanism: Secondary | ICD-10-CM | POA: Diagnosis not present

## 2022-09-17 DIAGNOSIS — D500 Iron deficiency anemia secondary to blood loss (chronic): Secondary | ICD-10-CM

## 2022-09-17 DIAGNOSIS — D509 Iron deficiency anemia, unspecified: Secondary | ICD-10-CM | POA: Diagnosis not present

## 2022-09-17 DIAGNOSIS — Z90710 Acquired absence of both cervix and uterus: Secondary | ICD-10-CM | POA: Diagnosis not present

## 2022-09-17 DIAGNOSIS — Z79899 Other long term (current) drug therapy: Secondary | ICD-10-CM | POA: Insufficient documentation

## 2022-09-17 DIAGNOSIS — Z800 Family history of malignant neoplasm of digestive organs: Secondary | ICD-10-CM | POA: Insufficient documentation

## 2022-09-17 DIAGNOSIS — Z808 Family history of malignant neoplasm of other organs or systems: Secondary | ICD-10-CM | POA: Insufficient documentation

## 2022-09-17 NOTE — Patient Instructions (Addendum)
You were seen and examined today by Dr. Ellin Saba. Dr. Ellin Saba is a hematologist, meaning that he specializes in blood abnormalities. Dr. Ellin Saba discussed your past medical history, family history of cancers/blood conditions and the events that led to you being here today.  You were referred to Dr. Ellin Saba due to iron deficiency anemia.  We will arrange for you to have IV iron. You will receive 2 doses one week apart.   Follow-up as scheduled.

## 2022-09-17 NOTE — Progress Notes (Signed)
[facility] [address] [place] [address]   Clinic Day:  09/17/2022  Referring physician: Wylene Men*  Patient Care Team: Del Newman Nip, Tenna Child, FNP as PCP - General (Family Medicine) West Bali, MD (Inactive) as Consulting Physician (Gastroenterology) Lanelle Bal, DO as Consulting Physician (Gastroenterology)   ASSESSMENT & PLAN:   Assessment:  1.  Iron deficiency anemia: - Patient had hysterectomy in June 2024 secondary to fibroid bleeding. - Cannot take oral iron therapy secondary to constipation. - Positive for tiredness, ice pica.  No prior blood transfusion.  Last Feraheme in 2017. - Occasional bleeding per rectum when she is constipated.  2. Social/Family History: -Works in the school system, including some physical labor. No tobacco use.  -Mother had anemia and sickle cell trait. Father had stomach cancer.  Maternal grandfather had colon cancer. Maternal grandmother had uterine cancer.    Plan:  1.  Iron deficiency anemia: - We reviewed recent labs from 08/23/2022: Ferritin low at 6, hemoglobin low at 10.2, MCV 66.  Folic acid and B12 are normal. - Recommend parenteral iron therapy with Feraheme x 2 if she is not able to tolerate oral iron therapy.  She reports that she did not get good response after last set of iron infusion in 2017.  It is likely that she may be having active blood loss at that time. - We discussed side effects including rare chance of anaphylactic reaction. - RTC 3 months for follow-up with repeat CBC, ferritin and iron panel.   Orders Placed This Encounter  Procedures   CBC    Standing Status:   Future    Standing Expiration Date:   09/17/2023   Iron and TIBC ([facility] ONLY)    Standing Status:   Future    Standing Expiration Date:   09/17/2023   Ferritin    Standing Status:   Future    Standing Expiration Date:   09/17/2023      Mikeal Hawthorne R Teague,acting as a scribe for  Doreatha Massed, MD.,have documented all relevant documentation on the behalf of Doreatha Massed, MD,as directed by  Doreatha Massed, MD while in the presence of Doreatha Massed, MD.   I, Doreatha Massed MD, have reviewed the above documentation for accuracy and completeness, and I agree with the above.   Doreatha Massed, MD   8/27/20243:40 PM  CHIEF COMPLAINT/PURPOSE OF CONSULT:   Diagnosis: Iron deficiency anemia  Current Therapy:  none  HISTORY OF PRESENT ILLNESS:   Hannah Osborn is a 37 y.o. female presenting to clinic today for evaluation of IDA at the request of Del Nigel Berthold, FNP.  Today, she states that she is doing well overall. Her appetite level is at 100%. Her energy level is at 25%.  She was found to have abnormal CBC from 08/23/22 with decreased HGB at 10.2, decreased MCV at 66, decreased MCH at 19.7, decreased MCHC at 29.7, and elevated RDW at 19.1. She had an abnormal Fe+TIBC+Fer panel from 08/23/22 with severely decreased iron saturation at 6% and decreased ferritin at 5. Vitamin D was decreased at 13.7 on 08/23/22. TSH, Vitamin B, and Folic acid from 08/23/22 were WNL.   Of note she underwent a total hysterectomy on 07/03/22 with Dr. Despina Hidden due to fibroid issues.  She notes constant fatigue. She occasionally has ice pica. She has never had a blood transfusion. She has had an iron transfusion in 01/11/16. She does not wish to receive blood transfusions. She is  supposed to be on iron, but cannot take it because it causes her constipation. She notes she does not normally have regular BM's and that iron pills worsen them. She currently takes a laxative to improve BM's. She used to have heavy bleeding during her menstruation before her hysterectomy. She reports occasional bleeding and black stools with BM's. She denies hematuria.    PAST MEDICAL HISTORY:   Past Medical History: Past Medical History:  Diagnosis Date   Allergy    Anemia    Anxiety     Anxiety and depression 03/21/2020   COVID-19    Fibroid 01/08/2016   Hypothyroid 04/03/2017   Hypothyroidism    Screen for STD (sexually transmitted disease) 10/26/2020   Thyroid disease     Surgical History: Past Surgical History:  Procedure Laterality Date   BIOPSY  11/29/2021   Procedure: BIOPSY;  Surgeon: Lanelle Bal, DO;  [facility];  Service: Endoscopy;;   CHOLECYSTECTOMY     COLONOSCOPY WITH PROPOFOL N/A 11/29/2021   Procedure: COLONOSCOPY WITH PROPOFOL;  Surgeon: Lanelle Bal, DO;  [facility];  Service: Endoscopy;  Laterality: N/A;  2:15 pm, pt knows to arrive at 10:15   DILITATION & CURRETTAGE/HYSTROSCOPY WITH NOVASURE ABLATION N/A 02/02/2016   Procedure: DILATATION & CURETTAGE/HYSTEROSCOPY WITH NOVASURE ABLATION (procedure 2);  Surgeon: Lazaro Arms, MD;  [facility];  Service: Gynecology;  Laterality: N/A;   ESOPHAGOGASTRODUODENOSCOPY (EGD) WITH PROPOFOL N/A 11/29/2021   Procedure: ESOPHAGOGASTRODUODENOSCOPY (EGD) WITH PROPOFOL;  Surgeon: Lanelle Bal, DO;  [facility];  Service: Endoscopy;  Laterality: N/A;   HEMORRHOID SURGERY N/A 05/01/2016   Procedure: EXTENSIVE HEMORRHOIDECTOMY;  Surgeon: Franky Macho, MD;  Location: [facility];  Service: General;  Laterality: N/A;   LAPAROSCOPIC BILATERAL SALPINGECTOMY Bilateral 02/02/2016   Procedure: LAPAROSCOPIC BILATERAL SALPINGECTOMY (procedure #1);  Surgeon: Lazaro Arms, MD;  [facility]: [facility];  Service: Gynecology;  Laterality: Bilateral;   ROBOTIC ASSISTED LAPAROSCOPIC HYSTERECTOMY AND SALPINGECTOMY N/A 07/03/2022   Procedure: XI ROBOTIC ASSISTED LAPAROSCOPIC HYSTERECTOMY, TAP BLOCK;  Surgeon: Lazaro Arms, MD;  Location: [facility];  Service: Gynecology;  Laterality: N/A;   TONSILLECTOMY     TUBAL LIGATION      Social History: Social History   Socioeconomic History   Marital status: Single    Spouse name: Not on file   Number of children: 1   Years of education: 12    Highest education level: Not on file  Occupational History   Occupation: dietary aid    Employer: [employer]  Tobacco Use   Smoking status: Never   Smokeless tobacco: Never  Vaping Use   Vaping status: Never Used  Substance and Sexual Activity   Alcohol use: Yes    Comment: occasional   Drug use: No   Sexual activity: Yes    Partners: Male    Birth control/protection: Surgical    Comment: tubal and ablation  Other Topics Concern   Not on file  Social History Narrative   Lives with son Denny Peon      Works in Surveyor, mining at [employer]   Social Determinants of Health   Financial Resource Strain: Low Risk  (03/21/2020)   Overall Financial Resource Strain (CARDIA)    Difficulty of Paying Living Expenses: Not hard at all  Food Insecurity: No Food Insecurity (03/21/2020)   Hunger Vital Sign    Worried About Running Out of Food in the Last Year: Never true  Ran Out of Food in the Last Year: Never true  Transportation Needs: No Transportation Needs (03/21/2020)   PRAPARE - Administrator, Civil Service (Medical): No    Lack of Transportation (Non-Medical): No  Physical Activity: Sufficiently Active (03/21/2020)   Exercise Vital Sign    Days of Exercise per Week: 6 days    Minutes of Exercise per Session: 100 min  Stress: Stress Concern Present (03/21/2020)   [employer] of Occupational Health - Occupational Stress Questionnaire    Feeling of Stress : To some extent  Social Connections: Socially Isolated (03/21/2020)   Social Connection and Isolation Panel [NHANES]    Frequency of Communication with Friends and Family: More than three times a week    Frequency of Social Gatherings with Friends and Family: Never    Attends Religious Services: Never    Database administrator or Organizations: No    Attends Banker Meetings: Never    Marital Status: Never married  Intimate Partner Violence: Not At Risk (03/21/2020)   Humiliation, Afraid, Rape,  and Kick questionnaire    Fear of Current or Ex-Partner: No    Emotionally Abused: No    Physically Abused: No    Sexually Abused: No    Family History: Family History  Problem Relation Age of Onset   Pulmonary embolism Mother    Early death Mother    Cancer Father        stomach   Early death Father    Asthma Son    Allergies Son    Heart disease Maternal Uncle    Cancer Maternal Grandmother        uterine   Cancer Maternal Grandfather        colon    Current Medications:  Current Outpatient Medications:    albuterol (VENTOLIN HFA) 108 (90 Base) MCG/ACT inhaler, Inhale 1-2 puffs into the lungs every 6 (six) hours as needed for wheezing or shortness of breath., Disp: 8 g, Rfl: 3   ALPRAZolam (XANAX) 0.5 MG tablet, Take 0.5 mg by mouth daily as needed for anxiety., Disp: , Rfl:    lubiprostone (AMITIZA) 24 MCG capsule, Take 1 capsule (24 mcg total) by mouth 2 (two) times daily with a meal., Disp: 60 capsule, Rfl: 3   methocarbamol (ROBAXIN) 500 MG tablet, Take 1 tablet (500 mg total) by mouth 2 (two) times daily., Disp: 20 tablet, Rfl: 0   ondansetron (ZOFRAN-ODT) 8 MG disintegrating tablet, Take 1 tablet (8 mg total) by mouth every 8 (eight) hours as needed for nausea or vomiting., Disp: 8 tablet, Rfl: 0   PARoxetine (PAXIL) 10 MG tablet, Take 1 tablet (10 mg total) by mouth at bedtime., Disp: 30 tablet, Rfl: 1   polyethylene glycol powder (GLYCOLAX/MIRALAX) 17 GM/SCOOP powder, 1 scoop daily or as needed, Disp: 255 g, Rfl: 11   rosuvastatin (CRESTOR) 10 MG tablet, Take 1 tablet (10 mg total) by mouth daily., Disp: 90 tablet, Rfl: 3   Allergies: No Known Allergies  REVIEW OF SYSTEMS:   Review of Systems  Constitutional:  Negative for chills, fatigue and fever.  HENT:   Negative for lump/mass, mouth sores, nosebleeds, sore throat and trouble swallowing.   Eyes:  Negative for eye problems.  Respiratory:  Positive for cough and shortness of breath.   Cardiovascular:   Negative for chest pain, leg swelling and palpitations.  Gastrointestinal:  Negative for abdominal pain, constipation, diarrhea, nausea and vomiting.  Genitourinary:  Negative for bladder incontinence, difficulty  urinating, dysuria, frequency, hematuria and nocturia.   Musculoskeletal:  Negative for arthralgias, back pain, flank pain, myalgias and neck pain.  Skin:  Negative for itching and rash.  Neurological:  Negative for dizziness, headaches and numbness.  Hematological:  Does not bruise/bleed easily.  Psychiatric/Behavioral:  Positive for depression and sleep disturbance. Negative for suicidal ideas. The patient is nervous/anxious.   All other systems reviewed and are negative.    VITALS:   Blood pressure 131/89, pulse (!) 104, temperature 98.5 F (36.9 C), temperature source Oral, resp. rate 18, height 5\' 3"  (1.6 m), weight 210 lb (95.3 kg), last menstrual period 03/14/2022, SpO2 99%.  Wt Readings from Last 3 Encounters:  09/17/22 210 lb (95.3 kg)  08/22/22 206 lb (93.4 kg)  08/20/22 210 lb (95.3 kg)    Body mass index is 37.2 kg/m.   PHYSICAL EXAM:   Physical Exam Vitals and nursing note reviewed. Exam conducted with a chaperone present.  Constitutional:      Appearance: Normal appearance.  Cardiovascular:     Rate and Rhythm: Regular rhythm. Tachycardia present.     Pulses: Normal pulses.     Heart sounds: Normal heart sounds.  Pulmonary:     Effort: Pulmonary effort is normal.     Breath sounds: Normal breath sounds.  Abdominal:     Palpations: Abdomen is soft. There is no hepatomegaly, splenomegaly or mass.     Tenderness: There is no abdominal tenderness.  Musculoskeletal:     Right lower leg: No edema.     Left lower leg: No edema.  Lymphadenopathy:     Cervical: No cervical adenopathy.     Right cervical: No superficial, deep or posterior cervical adenopathy.    Left cervical: No superficial, deep or posterior cervical adenopathy.     Upper Body:     Right  upper body: No supraclavicular or axillary adenopathy.     Left upper body: No supraclavicular or axillary adenopathy.  Neurological:     General: No focal deficit present.     Mental Status: She is alert and oriented to person, place, and time.  Psychiatric:        Mood and Affect: Mood normal.        Behavior: Behavior normal.     LABS:      Latest Ref Rng & Units 08/23/2022   10:31 AM 06/28/2022    8:19 AM 05/13/2022    9:50 AM  CBC  WBC 3.4 - 10.8 x10E3/uL 5.0  7.4    Hemoglobin 11.1 - 15.9 g/dL 16.1  09.6  04.5   Hematocrit 34.0 - 46.6 % 34.3  32.7    Platelets 150 - 450 x10E3/uL 407  367        Latest Ref Rng & Units 08/23/2022   10:31 AM 06/28/2022    8:19 AM 04/15/2019    4:01 PM  CMP  Glucose 70 - 99 mg/dL 92  82  90   BUN 6 - 20 mg/dL 8  16  13    Creatinine 0.57 - 1.00 mg/dL 4.09  8.11  9.14   Sodium 134 - 144 mmol/L 138  137  139   Potassium 3.5 - 5.2 mmol/L 4.5  3.3  3.5   Chloride 96 - 106 mmol/L 104  104  105   CO2 20 - 29 mmol/L 22  24  27    Calcium 8.7 - 10.2 mg/dL 9.6  9.1  9.4   Total Protein 6.5 - 8.1 g/dL  7.1  Total Bilirubin 0.3 - 1.2 mg/dL  0.3    Alkaline Phos 38 - 126 U/L  48    AST 15 - 41 U/L  14    ALT 0 - 44 U/L  16       No results found for: "CEA1", "CEA" / No results found for: "CEA1", "CEA" No results found for: "PSA1" No results found for: "YNW295" No results found for: "CAN125"  No results found for: "TOTALPROTELP", "ALBUMINELP", "A1GS", "A2GS", "BETS", "BETA2SER", "GAMS", "MSPIKE", "SPEI" Lab Results  Component Value Date   TIBC 441 08/23/2022   TIBC 418 03/15/2022   TIBC 503 (H) 12/27/2015   FERRITIN 5 (L) 08/23/2022   FERRITIN 4 (L) 03/15/2022   FERRITIN 8 (L) 12/27/2015   IRONPCTSAT 6 (LL) 08/23/2022   IRONPCTSAT 4 (LL) 03/15/2022   IRONPCTSAT 27 12/27/2015   No results found for: "LDH"   STUDIES:   No results found.

## 2022-09-19 ENCOUNTER — Ambulatory Visit (INDEPENDENT_AMBULATORY_CARE_PROVIDER_SITE_OTHER): Payer: BC Managed Care – PPO | Admitting: Family Medicine

## 2022-09-19 ENCOUNTER — Encounter: Payer: Self-pay | Admitting: Family Medicine

## 2022-09-19 VITALS — BP 121/76 | HR 91 | Resp 15 | Ht 63.0 in | Wt 210.0 lb

## 2022-09-19 DIAGNOSIS — G8929 Other chronic pain: Secondary | ICD-10-CM | POA: Diagnosis not present

## 2022-09-19 DIAGNOSIS — R52 Pain, unspecified: Secondary | ICD-10-CM

## 2022-09-19 MED ORDER — SEMAGLUTIDE-WEIGHT MANAGEMENT 0.5 MG/0.5ML ~~LOC~~ SOAJ: 0.5000 mg | SUBCUTANEOUS | 0 refills | Status: AC

## 2022-09-19 MED ORDER — DULOXETINE HCL 30 MG PO CPEP: 30.0000 mg | ORAL_CAPSULE | Freq: Every day | ORAL | 3 refills | Status: AC

## 2022-09-19 NOTE — Progress Notes (Signed)
Established Patient Office Visit   Subjective  Patient ID: Hannah Osborn, female    DOB: [birth year]  Age: 37 y.o. MRN: [id number]  Chief Complaint  Patient presents with   Obesity    3 week follow up visit    Back Pain    Lower back pain x 2-3 days going down both legs down to bottom of her feet     She  has a past medical history of Allergy, Anemia, Anxiety, Anxiety and depression (03/21/2020), COVID-19, Fibroid (01/08/2016), Hypothyroid (04/03/2017), Hypothyroidism, Screen for STD (sexually transmitted disease) (10/26/2020), and Thyroid disease.  Muscle Pain This is a recurrent problem. The problem occurs constantly. The problem has been waxing and waning since onset. The context of the pain is unknown. Pain location: diffuse: joints, hands, back , legs, feet, ankle. The pain is medium. Nothing aggravates the symptoms. Associated symptoms include fatigue, headaches and joint swelling. Pertinent negatives include no chest pain, eye pain or nausea. Past treatments include acetaminophen and OTC NSAID. The treatment provided no relief. The swelling is presently diffuse. She has been Sleeping poorly. Her past medical history is significant for chronic back pain. There is no history of rheumatic disease or sickle cell disease.    Review of Systems  Constitutional:  Positive for fatigue.  Eyes:  Negative for pain.  Cardiovascular:  Negative for chest pain.  Gastrointestinal:  Negative for nausea.  Musculoskeletal:  Positive for joint swelling.  Neurological:  Positive for headaches.      Objective:     BP 121/76   Pulse 91   Resp 15   Ht 5\' 3"  (1.6 m)   Wt 210 lb (95.3 kg)   LMP 03/14/2022 (Approximate)   SpO2 96%   BMI 37.20 kg/m  BP Readings from Last 3 Encounters:  09/19/22 121/76  09/17/22 131/89  08/22/22 121/81      Physical Exam Vitals reviewed.  Constitutional:      General: She is not in acute distress.    Appearance: Normal appearance. She is not  ill-appearing, toxic-appearing or diaphoretic.  HENT:     Head: Normocephalic.  Eyes:     General:        Right eye: No discharge.        Left eye: No discharge.     Conjunctiva/sclera: Conjunctivae normal.  Cardiovascular:     Rate and Rhythm: Normal rate.     Pulses: Normal pulses.     Heart sounds: Normal heart sounds.  Pulmonary:     Effort: Pulmonary effort is normal. No respiratory distress.     Breath sounds: Normal breath sounds.  Abdominal:     General: Bowel sounds are normal.     Palpations: Abdomen is soft.     Tenderness: There is no abdominal tenderness. There is no guarding.  Musculoskeletal:     Cervical back: Normal range of motion.     Lumbar back: Decreased range of motion. Positive right straight leg raise test and positive left straight leg raise test.  Skin:    General: Skin is warm and dry.     Capillary Refill: Capillary refill takes less than 2 seconds.  Neurological:     General: No focal deficit present.     Mental Status: She is alert and oriented to person, place, and time.     Coordination: Coordination normal.     Gait: Gait normal.  Psychiatric:        Mood and Affect: Mood normal.  Behavior: Behavior normal.      No results found for any visits on 09/19/22.  The ASCVD Risk score (Arnett DK, et al., 2019) failed to calculate for the following reasons:   The 2019 ASCVD risk score is only valid for ages 84 to 84    Assessment & Plan:  Diffuse pain -     Rheumatoid factor -     ANA w/Reflex  Other chronic pain Assessment & Plan: Diffuse pain can be related to low iron levels Advise patient to follow up with Hematology for iron infusionS Trial on Cymbalta 30 mg daily Labs ordered to rule out autoimmune diseases or RA   Other orders -     DULoxetine HCl; Take 1 capsule (30 mg total) by mouth daily.  Dispense: 30 capsule; Refill: 3 -     Semaglutide-Weight Management; Inject 0.5 mg into the skin once a week for 28 days.   Dispense: 2 mL; Refill: 0    Return if symptoms worsen or fail to improve.   Cruzita Lederer Newman Nip, FNP

## 2022-09-19 NOTE — Assessment & Plan Note (Addendum)
Diffuse pain can be related to low iron levels Advise patient to follow up with Hematology for iron infusionS Trial on Cymbalta 30 mg daily Labs ordered to rule out autoimmune diseases or RA

## 2022-09-19 NOTE — Patient Instructions (Signed)

## 2022-09-22 LAB — ANA W/REFLEX: Anti Nuclear Antibody (ANA): NEGATIVE

## 2022-09-22 LAB — RHEUMATOID FACTOR: Rheumatoid fact SerPl-aCnc: <10 [IU]/mL (ref <14.0)

## 2022-09-26 ENCOUNTER — Inpatient Hospital Stay: Payer: BC Managed Care – PPO | Attending: Hematology

## 2022-09-26 VITALS — BP 119/80 | HR 75 | Temp 97.2°F | Resp 18

## 2022-09-26 DIAGNOSIS — N920 Excessive and frequent menstruation with regular cycle: Secondary | ICD-10-CM

## 2022-09-26 DIAGNOSIS — D509 Iron deficiency anemia, unspecified: Secondary | ICD-10-CM | POA: Diagnosis present

## 2022-09-26 DIAGNOSIS — D500 Iron deficiency anemia secondary to blood loss (chronic): Secondary | ICD-10-CM

## 2022-09-26 MED ORDER — SODIUM CHLORIDE 0.9 % IV SOLN: Freq: Once | INTRAVENOUS | Status: AC

## 2022-09-26 MED ORDER — SODIUM CHLORIDE 0.9 % IV SOLN
510.0000 mg | Freq: Once | INTRAVENOUS | Status: AC
  Administered 2022-09-26: 510 mg via INTRAVENOUS
  Filled 2022-09-26: qty 510

## 2022-09-26 NOTE — Patient Instructions (Signed)
[facility]  Discharge Instructions: Thank you for choosing [facility] to provide your oncology and hematology care.  If you have a lab appointment with the Cancer Center - please note that after April 8th, 2024, all labs will be drawn in the cancer center.  You do not have to check in or register with the main entrance as you have in the past but will complete your check-in in the cancer center.  Wear comfortable clothing and clothing appropriate for easy access to any Portacath or PICC line.   We strive to give you quality time with your provider. You may need to reschedule your appointment if you arrive late (15 or more minutes).  Arriving late affects you and other patients whose appointments are after yours.  Also, if you miss three or more appointments without notifying the office, you may be dismissed from the clinic at the provider's discretion.      For prescription refill requests, have your pharmacy contact our office and allow 72 hours for refills to be completed.    Today you received Feraheme IV iron infusion.   .  BELOW ARE SYMPTOMS THAT SHOULD BE REPORTED IMMEDIATELY: *FEVER GREATER THAN 100.4 F (38 C) OR HIGHER *CHILLS OR SWEATING *NAUSEA AND VOMITING THAT IS NOT CONTROLLED WITH YOUR NAUSEA MEDICATION *UNUSUAL SHORTNESS OF BREATH *UNUSUAL BRUISING OR BLEEDING *URINARY PROBLEMS (pain or burning when urinating, or frequent urination) *BOWEL PROBLEMS (unusual diarrhea, constipation, pain near the anus) TENDERNESS IN MOUTH AND THROAT WITH OR WITHOUT PRESENCE OF ULCERS (sore throat, sores in mouth, or a toothache) UNUSUAL RASH, SWELLING OR PAIN  UNUSUAL VAGINAL DISCHARGE OR ITCHING   Items with * indicate a potential emergency and should be followed up as soon as possible or go to the Emergency Department if any problems should occur.  Please show the CHEMOTHERAPY ALERT CARD or IMMUNOTHERAPY ALERT CARD at check-in to the Emergency  Department and triage nurse.  Should you have questions after your visit or need to cancel or reschedule your appointment, please contact [facility] [phone]  and follow the prompts.  Office hours are 8:00 a.m. to 4:30 p.m. Monday - Friday. Please note that voicemails left after 4:00 p.m. may not be returned until the following business day.  We are closed weekends and major holidays. You have access to a nurse at all times for urgent questions. Please call the main number to the clinic [phone] and follow the prompts.  For any non-urgent questions, you may also contact your provider using MyChart. We now offer e-Visits for anyone 18 and older to request care online for non-urgent symptoms. For details visit [email or web address].   Also download the MyChart app! Go to the app store, search "MyChart", open the app, select [facility], and log in with your MyChart username and password.   

## 2022-09-26 NOTE — Progress Notes (Signed)
Patient presents today for iron infusion.  Patient is in satisfactory condition with no new complaints voiced.  Vital signs are stable.  We will proceed with infusion per provider orders.    Peripheral IV started with good blood return pre and post infusion.  Feraheme 510 mg  given today per MD orders. Tolerated infusion without adverse affects. Vital signs stable. No complaints at this time. Discharged from clinic ambulatory in stable condition. Alert and oriented x 3. F/U with [facility] as scheduled.   

## 2022-10-03 ENCOUNTER — Inpatient Hospital Stay: Payer: BC Managed Care – PPO

## 2022-10-03 VITALS — BP 110/77 | HR 74 | Temp 97.3°F | Resp 20

## 2022-10-03 DIAGNOSIS — D500 Iron deficiency anemia secondary to blood loss (chronic): Secondary | ICD-10-CM

## 2022-10-03 DIAGNOSIS — N920 Excessive and frequent menstruation with regular cycle: Secondary | ICD-10-CM

## 2022-10-03 DIAGNOSIS — D509 Iron deficiency anemia, unspecified: Secondary | ICD-10-CM | POA: Diagnosis not present

## 2022-10-03 MED ORDER — SODIUM CHLORIDE 0.9 % IV SOLN
510.0000 mg | Freq: Once | INTRAVENOUS | Status: AC
  Administered 2022-10-03: 510 mg via INTRAVENOUS
  Filled 2022-10-03: qty 510

## 2022-10-03 MED ORDER — SODIUM CHLORIDE 0.9 % IV SOLN: Freq: Once | INTRAVENOUS | Status: AC

## 2022-10-03 NOTE — Patient Instructions (Signed)
[facility]  Discharge Instructions: Thank you for choosing [facility] to provide your oncology and hematology care.  If you have a lab appointment with the Cancer Center - please note that after April 8th, 2024, all labs will be drawn in the cancer center.  You do not have to check in or register with the main entrance as you have in the past but will complete your check-in in the cancer center.  Wear comfortable clothing and clothing appropriate for easy access to any Portacath or PICC line.   We strive to give you quality time with your provider. You may need to reschedule your appointment if you arrive late (15 or more minutes).  Arriving late affects you and other patients whose appointments are after yours.  Also, if you miss three or more appointments without notifying the office, you may be dismissed from the clinic at the provider's discretion.      For prescription refill requests, have your pharmacy contact our office and allow 72 hours for refills to be completed.    Today you received Feraheme IV iron infusion.   .  BELOW ARE SYMPTOMS THAT SHOULD BE REPORTED IMMEDIATELY: *FEVER GREATER THAN 100.4 F (38 C) OR HIGHER *CHILLS OR SWEATING *NAUSEA AND VOMITING THAT IS NOT CONTROLLED WITH YOUR NAUSEA MEDICATION *UNUSUAL SHORTNESS OF BREATH *UNUSUAL BRUISING OR BLEEDING *URINARY PROBLEMS (pain or burning when urinating, or frequent urination) *BOWEL PROBLEMS (unusual diarrhea, constipation, pain near the anus) TENDERNESS IN MOUTH AND THROAT WITH OR WITHOUT PRESENCE OF ULCERS (sore throat, sores in mouth, or a toothache) UNUSUAL RASH, SWELLING OR PAIN  UNUSUAL VAGINAL DISCHARGE OR ITCHING   Items with * indicate a potential emergency and should be followed up as soon as possible or go to the Emergency Department if any problems should occur.  Please show the CHEMOTHERAPY ALERT CARD or IMMUNOTHERAPY ALERT CARD at check-in to the Emergency  Department and triage nurse.  Should you have questions after your visit or need to cancel or reschedule your appointment, please contact [facility] [phone]  and follow the prompts.  Office hours are 8:00 a.m. to 4:30 p.m. Monday - Friday. Please note that voicemails left after 4:00 p.m. may not be returned until the following business day.  We are closed weekends and major holidays. You have access to a nurse at all times for urgent questions. Please call the main number to the clinic [phone] and follow the prompts.  For any non-urgent questions, you may also contact your provider using MyChart. We now offer e-Visits for anyone 18 and older to request care online for non-urgent symptoms. For details visit [email or web address].   Also download the MyChart app! Go to the app store, search "MyChart", open the app, select [facility], and log in with your MyChart username and password.   

## 2022-10-03 NOTE — Progress Notes (Signed)
Patient presents today for iron infusion.  Patient is in satisfactory condition with no new complaints voiced.  Vital signs are stable.  We will proceed with infusion per provider orders.   Peripheral IV started with good blood return pre and post infusion.  Treatment given today per MD orders. Tolerated infusion without adverse affects. Vital signs stable. No complaints at this time. Discharged from clinic ambulatory in stable condition. Alert and oriented x 3. F/U with [facility] as scheduled.

## 2022-10-23 ENCOUNTER — Encounter: Payer: Self-pay | Admitting: Obstetrics & Gynecology

## 2022-11-14 NOTE — Progress Notes (Signed)
Established Patient Office Visit   Subjective  Patient ID: Hannah Osborn, female    DOB: 1985-02-11  Age: 37 y.o. MRN: [id number]  Chief Complaint  Patient presents with   Follow-up    Anemia, wants to discuss wt. Loss medication. Joint pain in Rt. Elbow and lft. Knee.     She  has a past medical history of Allergy, Anemia, Anxiety, Anxiety and depression (03/21/2020), COVID-19, Fibroid (01/08/2016), Hypothyroid (04/03/2017), Hypothyroidism, Screen for STD (sexually transmitted disease) (10/26/2020), and Thyroid disease.  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 She describes the pain as non-radiating and rates it at 3/10 in severity, with fluctuations since onset. Pertinent negatives include the absence of numbness or tingling. Symptoms are worsened by movement, lifting, and palpation. She has tried NSAIDs for relief, but they have provided no improvement.    Review of Systems  Constitutional:  Negative for chills and fever.  Respiratory:  Negative for shortness of breath.   Cardiovascular:  Negative for chest pain.  Gastrointestinal:  Negative for abdominal pain.  Musculoskeletal:  Positive for joint pain and myalgias.      Objective:     BP 123/86   Pulse 87   Ht 5\' 3"  (1.6 m)   Wt 210 lb 0.5 oz (95.3 kg)   LMP 03/14/2022 (Approximate)   SpO2 94%   BMI 37.21 kg/m  BP Readings from Last 3 Encounters:  11/15/22 123/86  10/03/22 110/77  09/26/22 119/80      Physical Exam Vitals reviewed.  Constitutional:      General: She is not in acute distress.    Appearance: Normal appearance. She is not ill-appearing, toxic-appearing or diaphoretic.  HENT:     Head: Normocephalic.  Eyes:     General:        Right eye: No discharge.        Left eye: No discharge.     Conjunctiva/sclera: Conjunctivae normal.  Cardiovascular:     Rate and  Rhythm: Normal rate.     Pulses: Normal pulses.     Heart sounds: Normal heart sounds.  Pulmonary:     Effort: Pulmonary effort is normal. No respiratory distress.     Breath sounds: Normal breath sounds.  Abdominal:     General: Bowel sounds are normal.     Palpations: Abdomen is soft.     Tenderness: There is no abdominal tenderness. There is no guarding.  Musculoskeletal:        General: Normal range of motion.     Right elbow: Normal. No swelling. Normal range of motion. No tenderness.     Left elbow: No swelling. Normal range of motion. Tenderness present.     Cervical back: Normal range of motion.  Skin:    General: Skin is warm and dry.     Capillary Refill: Capillary refill takes less than 2 seconds.  Neurological:     General: No focal deficit present.     Mental Status: She is alert and oriented to person, place, and time.     Coordination: Coordination normal.     Gait: Gait normal.  Psychiatric:        Mood and Affect: Mood normal.        Behavior: Behavior normal.      No results found for any visits on 11/15/22.  The ASCVD Risk score (Arnett DK, et al.,  2019) failed to calculate for the following reasons:   The 2019 ASCVD risk score is only valid for ages 108 to 50    Assessment & Plan:  Iron deficiency anemia, unspecified iron deficiency anemia type Assessment & Plan: Patient receives iron infusions CBC and Iron panel ordered today Discussed to consume iron rich foods such as Red meat, pork, poultry, seafood, beans, dark green leafy vegetables, such as spinach, dried fruit, such as raisins and apricots. Iron-fortified cereals, and peas.   Orders: -     CBC with Differential/Platelet -     Iron, TIBC and Ferritin Panel  Encounter for screening for cardiovascular disorders -     Lipid panel  Elbow tendonitis -     predniSONE; Take 1 tablet (20 mg total) by mouth 2 (two) times daily with a meal for 5 days.  Dispense: 10 tablet; Refill: 0  Left elbow  tendonitis Assessment & Plan: Prednisone 20 mg twice daily x 5 days Advise patient rest the affected arm and avoid activities that trigger pain, such as lifting heavy objects. Applying ice for 15-20 minutes a few times daily can help reduce inflammation, and using a compression band may provide added support. Gentle stretching and strengthening exercises     Return in about 6 months (around 05/16/2023), or if symptoms worsen or fail to improve, for hyperlipidemia.   Cruzita Lederer Newman Nip, FNP

## 2022-11-14 NOTE — Patient Instructions (Signed)

## 2022-11-15 ENCOUNTER — Ambulatory Visit (INDEPENDENT_AMBULATORY_CARE_PROVIDER_SITE_OTHER): Payer: BC Managed Care – PPO | Admitting: Family Medicine

## 2022-11-15 ENCOUNTER — Encounter: Payer: Self-pay | Admitting: Family Medicine

## 2022-11-15 VITALS — BP 123/86 | HR 87 | Ht 63.0 in | Wt 210.0 lb

## 2022-11-15 DIAGNOSIS — Z136 Encounter for screening for cardiovascular disorders: Secondary | ICD-10-CM | POA: Diagnosis not present

## 2022-11-15 DIAGNOSIS — Z23 Encounter for immunization: Secondary | ICD-10-CM

## 2022-11-15 DIAGNOSIS — D509 Iron deficiency anemia, unspecified: Secondary | ICD-10-CM

## 2022-11-15 DIAGNOSIS — M778 Other enthesopathies, not elsewhere classified: Secondary | ICD-10-CM | POA: Insufficient documentation

## 2022-11-15 MED ORDER — PREDNISONE 20 MG PO TABS: 20.0000 mg | ORAL_TABLET | Freq: Two times a day (BID) | ORAL | 0 refills | Status: AC

## 2022-11-15 NOTE — Assessment & Plan Note (Signed)
Prednisone 20 mg twice daily x 5 days Advise patient rest the affected arm and avoid activities that trigger pain, such as lifting heavy objects. Applying ice for 15-20 minutes a few times daily can help reduce inflammation, and using a compression band may provide added support. Gentle stretching and strengthening exercises

## 2022-11-15 NOTE — Assessment & Plan Note (Signed)
Patient receives iron infusions CBC and Iron panel ordered today Discussed to consume iron rich foods such as Red meat, pork, poultry, seafood, beans, dark green leafy vegetables, such as spinach, dried fruit, such as raisins and apricots. Iron-fortified cereals, and peas.

## 2022-11-18 ENCOUNTER — Encounter: Payer: Self-pay | Admitting: Family Medicine

## 2022-11-19 ENCOUNTER — Other Ambulatory Visit: Payer: Self-pay | Admitting: Family Medicine

## 2022-11-19 ENCOUNTER — Encounter: Payer: Self-pay | Admitting: Family Medicine

## 2022-11-19 MED ORDER — SEMAGLUTIDE-WEIGHT MANAGEMENT 0.25 MG/0.5ML ~~LOC~~ SOAJ: 0.2500 mg | SUBCUTANEOUS | 0 refills | Status: AC

## 2022-11-19 NOTE — Telephone Encounter (Signed)
Hey, We can start PA for wegovy :)  thanks

## 2022-11-19 NOTE — Telephone Encounter (Signed)
Please let patient know PA  for weogvy takes time

## 2022-11-20 ENCOUNTER — Other Ambulatory Visit: Payer: Self-pay | Admitting: Family Medicine

## 2022-11-20 LAB — LIPID PANEL
Chol/HDL Ratio: 3.5 ratio (ref 0.0–4.4)
Cholesterol, Total: 260 mg/dL — ABNORMAL HIGH (ref 100–199)
HDL: 74 mg/dL (ref >39)
LDL Chol Calc (NIH): 172 mg/dL — ABNORMAL HIGH (ref 0–99)
Triglycerides: 84 mg/dL (ref 0–149)
VLDL Cholesterol Cal: 14 mg/dL (ref 5–40)

## 2022-11-20 LAB — CBC WITH DIFFERENTIAL/PLATELET
Basophils Absolute: 0.1 10*3/uL (ref 0.0–0.2)
Basos: 1 %
EOS (ABSOLUTE): 0 10*3/uL (ref 0.0–0.4)
Eos: 0 %
Hematocrit: 39.6 % (ref 34.0–46.6)
Hemoglobin: 12.5 g/dL (ref 11.1–15.9)
Immature Grans (Abs): 0 10*3/uL (ref 0.0–0.1)
Immature Granulocytes: 1 %
Lymphocytes Absolute: 2.8 10*3/uL (ref 0.7–3.1)
Lymphs: 33 %
MCH: 24 pg — ABNORMAL LOW (ref 26.6–33.0)
MCHC: 31.6 g/dL (ref 31.5–35.7)
MCV: 76 fL — ABNORMAL LOW (ref 79–97)
Monocytes Absolute: 0.6 10*3/uL (ref 0.1–0.9)
Monocytes: 7 %
Neutrophils Absolute: 5 10*3/uL (ref 1.4–7.0)
Neutrophils: 58 %
Platelets: 305 10*3/uL (ref 150–450)
RBC: 5.21 x10E6/uL (ref 3.77–5.28)
WBC: 8.6 10*3/uL (ref 3.4–10.8)

## 2022-11-20 LAB — IRON,TIBC AND FERRITIN PANEL
Ferritin: 107 ng/mL (ref 15–150)
Iron Saturation: 25 % (ref 15–55)
Iron: 75 ug/dL (ref 27–159)
Total Iron Binding Capacity: 296 ug/dL (ref 250–450)
UIBC: 221 ug/dL (ref 131–425)

## 2022-11-21 NOTE — Telephone Encounter (Signed)
Please inform patient , Some iron-rich foods, like red meat, can indeed raise cholesterol if eaten frequently. Try balancing your iron sources with options like leafy greens, beans, and fortified cereals, which are lower in cholesterol

## 2022-12-06 ENCOUNTER — Inpatient Hospital Stay: Payer: BC Managed Care – PPO

## 2022-12-10 ENCOUNTER — Other Ambulatory Visit: Payer: Self-pay | Admitting: Gastroenterology

## 2022-12-10 ENCOUNTER — Inpatient Hospital Stay: Payer: BC Managed Care – PPO | Attending: Oncology

## 2022-12-10 DIAGNOSIS — D509 Iron deficiency anemia, unspecified: Secondary | ICD-10-CM | POA: Diagnosis present

## 2022-12-10 DIAGNOSIS — D500 Iron deficiency anemia secondary to blood loss (chronic): Secondary | ICD-10-CM

## 2022-12-10 LAB — CBC
HCT: 36.6 % (ref 36.0–46.0)
Hemoglobin: 12.1 g/dL (ref 12.0–15.0)
MCH: 25 pg — ABNORMAL LOW (ref 26.0–34.0)
MCHC: 33.1 g/dL (ref 30.0–36.0)
MCV: 75.6 fL — ABNORMAL LOW (ref 80.0–100.0)
Platelets: 269 10*3/uL (ref 150–400)
RBC: 4.84 MIL/uL (ref 3.87–5.11)
RDW: 22.8 % — ABNORMAL HIGH (ref 11.5–15.5)
WBC: 5.3 10*3/uL (ref 4.0–10.5)
nRBC: 0 % (ref 0.0–0.2)

## 2022-12-10 LAB — IRON AND TIBC
Iron: 72 ug/dL (ref 28–170)
Saturation Ratios: 21 % (ref 10.4–31.8)
TIBC: 339 ug/dL (ref 250–450)
UIBC: 267 ug/dL

## 2022-12-10 LAB — FERRITIN: Ferritin: 46 ng/mL (ref 11–307)

## 2022-12-13 ENCOUNTER — Inpatient Hospital Stay: Payer: BC Managed Care – PPO | Admitting: Oncology

## 2022-12-16 ENCOUNTER — Encounter: Payer: Self-pay | Admitting: Family Medicine

## 2022-12-16 ENCOUNTER — Other Ambulatory Visit: Payer: Self-pay | Admitting: Family Medicine

## 2022-12-16 MED ORDER — HYDROCORTISONE BUTYRATE 0.1 % EX CREA: 1.0000 | TOPICAL_CREAM | Freq: Two times a day (BID) | CUTANEOUS | 0 refills | Status: DC

## 2022-12-16 NOTE — Telephone Encounter (Signed)
Please advise : OTC CeraVe Eczema Relief Creamy Oil  I sent in prescription cream for 14 days  Moisturize twice daily with fragrance-free creams, especially after bathing. Use gentle, soap-free cleansers and lukewarm water, then pat dry and apply moisturizer. Wear soft fabrics like cotton and avoid wool or irritants. Keep your environment cool, use a humidifier, and stay hydrated. Identify triggers like allergens, stress, or certain foods, and avoid them. Over-the-counter hydrocortisone or antihistamines can help with itching, and oatmeal baths can soothe the skin. Keep nails short.    Can call for Dermatology  [facility] # [phone]

## 2023-01-12 NOTE — Progress Notes (Unsigned)
I connected with Hannah Osborn on 01/14/23 at  9:00 AM EST by telephone visit and verified that I am speaking with the correct person using two identifiers.   I discussed the limitations, risks, security and privacy concerns of performing an evaluation and management service by telemedicine and the availability of in-person appointments. I also discussed with the patient that there may be a patient responsible charge related to this service. The patient expressed understanding and agreed to proceed.   Other persons participating in the visit and their role in the encounter: NP, Patient    Patient's location: Home Provider's location: Clinic  [facility] [address] [place] [address]   Clinic Day:  01/12/2023  Referring physician: Wylene Men*  Patient Care Team: Del Newman Nip, Tenna Child, FNP as PCP - General (Family Medicine) West Bali, MD (Inactive) as Consulting Physician (Gastroenterology) Lanelle Bal, DO as Consulting Physician (Gastroenterology)   ASSESSMENT & PLAN:   Assessment:  1.  Iron deficiency anemia: - Patient had hysterectomy in June 2024 secondary to fibroid bleeding. - Cannot take oral iron therapy secondary to constipation. - Positive for tiredness, ice pica.  No prior blood transfusion.  Received IV Feraheme in 2017 and again recently in September 2024. - Occasional bleeding per rectum when she is constipated.  2. Social/Family History: -Works in the school system, including some physical labor. No tobacco use.  -Mother had anemia and sickle cell trait. Father had stomach cancer.  Maternal grandfather had colon cancer. Maternal grandmother had uterine cancer.    Plan:  1.  Iron deficiency anemia: - She received 2 doses of IV Feraheme on 09/26/2022 and 10/03/2022. -Unable to tolerate oral iron secondary to GI upset. -Overall labs have improved since September although iron levels are starting to trend back down  from lab work in October.  Labs from 12/10/2022 show hemoglobin of 12.1 (12.7), MCV 75.6 (76), normal platelet count, iron saturations 21% and ferritin 46 (107).  -She denies any bleeding.  -Recommend 2 additional doses of IV iron - RTC 3 months for follow-up with repeat CBC, ferritin and iron panel.   No orders of the defined types were placed in this encounter.  I provided 20 minutes of non face-to-face telephone visit time during this encounter, and > 50% was spent counseling as documented under my assessment & plan.     CHIEF COMPLAINT/PURPOSE OF CONSULT:   Diagnosis: Iron deficiency anemia  Current Therapy: IV Feraheme  HISTORY OF PRESENT ILLNESS:   Hannah Osborn is a 37 y.o. female presenting to clinic today for follow-up for iron deficiency anemia.  She received 2 doses of IV Feraheme in September 2024 with good tolerance.  Today, she states that she is doing well overall. Her appetite level is at 100%. Her energy level is at 75%.  Of note she underwent a total hysterectomy on 07/03/22 with Dr. Despina Hidden due to fibroid issues.  She denies any recent hospitalizations, surgeries or changes to her baseline health.  Notes improvement of her energy levels since receiving iron.  Denies any bleeding.  Overall feels much improved.  Has occasional constipation and takes laxatives as needed.   PAST MEDICAL HISTORY:   Past Medical History: Past Medical History:  Diagnosis Date   Allergy    Anemia    Anxiety    Anxiety and depression 03/21/2020   COVID-19    Fibroid 01/08/2016   Hypothyroid 04/03/2017   Hypothyroidism    Screen for STD (sexually  transmitted disease) 10/26/2020   Thyroid disease     Surgical History: Past Surgical History:  Procedure Laterality Date   BIOPSY  11/29/2021   Procedure: BIOPSY;  Surgeon: Lanelle Bal, DO;  [facility];  Service: Endoscopy;;   CHOLECYSTECTOMY     COLONOSCOPY WITH PROPOFOL N/A 11/29/2021   Procedure: COLONOSCOPY WITH  PROPOFOL;  Surgeon: Lanelle Bal, DO;  Location: [facility];  Service: Endoscopy;  Laterality: N/A;  2:15 pm, pt knows to arrive at 10:15   DILITATION & CURRETTAGE/HYSTROSCOPY WITH NOVASURE ABLATION N/A 02/02/2016   Procedure: DILATATION & CURETTAGE/HYSTEROSCOPY WITH NOVASURE ABLATION (procedure 2);  Surgeon: Lazaro Arms, MD;  Location: [facility];  Service: Gynecology;  Laterality: N/A;   ESOPHAGOGASTRODUODENOSCOPY (EGD) WITH PROPOFOL N/A 11/29/2021   Procedure: ESOPHAGOGASTRODUODENOSCOPY (EGD) WITH PROPOFOL;  Surgeon: Lanelle Bal, DO;  [facility];  Service: Endoscopy;  Laterality: N/A;   HEMORRHOID SURGERY N/A 05/01/2016   Procedure: EXTENSIVE HEMORRHOIDECTOMY;  Surgeon: Franky Macho, MD;  Location: [facility];  Service: General;  Laterality: N/A;   LAPAROSCOPIC BILATERAL SALPINGECTOMY Bilateral 02/02/2016   Procedure: LAPAROSCOPIC BILATERAL SALPINGECTOMY (procedure #1);  Surgeon: Lazaro Arms, MD;  Location: [facility];  Service: Gynecology;  Laterality: Bilateral;   ROBOTIC ASSISTED LAPAROSCOPIC HYSTERECTOMY AND SALPINGECTOMY N/A 07/03/2022   Procedure: XI ROBOTIC ASSISTED LAPAROSCOPIC HYSTERECTOMY, TAP BLOCK;  Surgeon: Lazaro Arms, MD;  Location: [facility];  Service: Gynecology;  Laterality: N/A;   TONSILLECTOMY     TUBAL LIGATION      Social History: Social History   Socioeconomic History   Marital status: Single    Spouse name: Not on file   Number of children: 1   Years of education: 12   Highest education level: Not on file  Occupational History   Occupation: dietary aid    Employer: [employer]  Tobacco Use   Smoking status: Never   Smokeless tobacco: Never  Vaping Use   Vaping status: Never Used  Substance and Sexual Activity   Alcohol use: Yes    Comment: occasional   Drug use: No   Sexual activity: Yes    Partners: Male    Birth control/protection: Surgical    Comment: tubal and ablation  Other Topics Concern   Not on file  Social  History Narrative   Lives with son Denny Peon      Works in Surveyor, mining at [employer]   Social Drivers of Health   Financial Resource Strain: Low Risk  (03/21/2020)   Overall Financial Resource Strain (CARDIA)    Difficulty of Paying Living Expenses: Not hard at all  Food Insecurity: No Food Insecurity (03/21/2020)   Hunger Vital Sign    Worried About Running Out of Food in the Last Year: Never true    Ran Out of Food in the Last Year: Never true  Transportation Needs: No Transportation Needs (03/21/2020)   PRAPARE - Administrator, Civil Service (Medical): No    Lack of Transportation (Non-Medical): No  Physical Activity: Sufficiently Active (03/21/2020)   Exercise Vital Sign    Days of Exercise per Week: 6 days    Minutes of Exercise per Session: 100 min  Stress: Stress Concern Present (03/21/2020)   [employer] of Occupational Health - Occupational Stress Questionnaire    Feeling of Stress : To some extent  Social Connections: Socially Isolated (03/21/2020)   Social Connection and Isolation Panel [NHANES]    Frequency of Communication with Friends  and Family: More than three times a week    Frequency of Social Gatherings with Friends and Family: Never    Attends Religious Services: Never    Database administrator or Organizations: No    Attends Banker Meetings: Never    Marital Status: Never married  Intimate Partner Violence: Not At Risk (03/21/2020)   Humiliation, Afraid, Rape, and Kick questionnaire    Fear of Current or Ex-Partner: No    Emotionally Abused: No    Physically Abused: No    Sexually Abused: No    Family History: Family History  Problem Relation Age of Onset   Pulmonary embolism Mother    Early death Mother    Cancer Father        stomach   Early death Father    Asthma Son    Allergies Son    Heart disease Maternal Uncle    Cancer Maternal Grandmother        uterine   Cancer Maternal Grandfather        colon    Current  Medications:  Current Outpatient Medications:    albuterol (VENTOLIN HFA) 108 (90 Base) MCG/ACT inhaler, Inhale 1-2 puffs into the lungs every 6 (six) hours as needed for wheezing or shortness of breath., Disp: 8 g, Rfl: 3   DULoxetine (CYMBALTA) 30 MG capsule, Take 1 capsule (30 mg total) by mouth daily., Disp: 30 capsule, Rfl: 3   hydrocortisone butyrate (LUCOID) 0.1 % CREA cream, Apply 1 Application topically 2 (two) times daily. For 14 days, Disp: 45 g, Rfl: 0   ibuprofen (ADVIL) 800 MG tablet, Take 800 mg by mouth every 8 (eight) hours as needed., Disp: , Rfl:    lubiprostone (AMITIZA) 24 MCG capsule, TAKE 1 CAPSULE BY MOUTH TWICE DAILY WITH A MEAL, Disp: 60 capsule, Rfl: 0   methocarbamol (ROBAXIN) 500 MG tablet, Take 1 tablet (500 mg total) by mouth 2 (two) times daily., Disp: 20 tablet, Rfl: 0   polyethylene glycol powder (GLYCOLAX/MIRALAX) 17 GM/SCOOP powder, 1 scoop daily or as needed, Disp: 255 g, Rfl: 11   rosuvastatin (CRESTOR) 10 MG tablet, Take 1 tablet (10 mg total) by mouth daily., Disp: 90 tablet, Rfl: 3   Allergies: No Known Allergies  REVIEW OF SYSTEMS:   Review of Systems  Constitutional:  Negative for fatigue.  Gastrointestinal:  Positive for constipation. Negative for abdominal distention, blood in stool, diarrhea and nausea.  Genitourinary:  Negative for hematuria.      VITALS:   Last menstrual period 03/14/2022.  Wt Readings from Last 3 Encounters:  11/15/22 210 lb 0.5 oz (95.3 kg)  09/19/22 210 lb (95.3 kg)  09/17/22 210 lb (95.3 kg)    There is no height or weight on file to calculate BMI.   PHYSICAL EXAM:   Physical Exam Neurological:     Mental Status: She is alert and oriented to person, place, and time.     LABS:      Latest Ref Rng & Units 12/10/2022    3:56 PM 11/19/2022    8:23 AM 08/23/2022   10:31 AM  CBC  WBC 4.0 - 10.5 K/uL 5.3  8.6  5.0   Hemoglobin 12.0 - 15.0 g/dL 81.1  91.4  78.2   Hematocrit 36.0 - 46.0 % 36.6  39.6  34.3    Platelets 150 - 400 K/uL 269  305  407       Latest Ref Rng & Units 08/23/2022   10:31 AM  06/28/2022    8:19 AM 04/15/2019    4:01 PM  CMP  Glucose 70 - 99 mg/dL 92  82  90   BUN 6 - 20 mg/dL 8  16  13    Creatinine 0.57 - 1.00 mg/dL 0.98  1.19  1.47   Sodium 134 - 144 mmol/L 138  137  139   Potassium 3.5 - 5.2 mmol/L 4.5  3.3  3.5   Chloride 96 - 106 mmol/L 104  104  105   CO2 20 - 29 mmol/L 22  24  27    Calcium 8.7 - 10.2 mg/dL 9.6  9.1  9.4   Total Protein 6.5 - 8.1 g/dL  7.1    Total Bilirubin 0.3 - 1.2 mg/dL  0.3    Alkaline Phos 38 - 126 U/L  48    AST 15 - 41 U/L  14    ALT 0 - 44 U/L  16       No results found for: "CEA1", "CEA" / No results found for: "CEA1", "CEA" No results found for: "PSA1" No results found for: "WGN562" No results found for: "CAN125"  No results found for: "TOTALPROTELP", "ALBUMINELP", "A1GS", "A2GS", "BETS", "BETA2SER", "GAMS", "MSPIKE", "SPEI" Lab Results  Component Value Date   TIBC 339 12/10/2022   TIBC 296 11/19/2022   TIBC 441 08/23/2022   FERRITIN 46 12/10/2022   FERRITIN 107 11/19/2022   FERRITIN 5 (L) 08/23/2022   IRONPCTSAT 21 12/10/2022   IRONPCTSAT 25 11/19/2022   IRONPCTSAT 6 (LL) 08/23/2022   No results found for: "LDH"   STUDIES:   No results found.

## 2023-01-14 ENCOUNTER — Encounter: Payer: Self-pay | Admitting: Oncology

## 2023-01-14 ENCOUNTER — Inpatient Hospital Stay: Payer: BC Managed Care – PPO | Attending: Oncology | Admitting: Oncology

## 2023-01-14 DIAGNOSIS — Z79899 Other long term (current) drug therapy: Secondary | ICD-10-CM | POA: Insufficient documentation

## 2023-01-14 DIAGNOSIS — D500 Iron deficiency anemia secondary to blood loss (chronic): Secondary | ICD-10-CM | POA: Diagnosis not present

## 2023-01-14 DIAGNOSIS — D509 Iron deficiency anemia, unspecified: Secondary | ICD-10-CM | POA: Insufficient documentation

## 2023-01-16 ENCOUNTER — Inpatient Hospital Stay: Payer: BC Managed Care – PPO

## 2023-01-16 VITALS — BP 120/80 | HR 73 | Temp 97.4°F | Resp 18

## 2023-01-16 DIAGNOSIS — Z79899 Other long term (current) drug therapy: Secondary | ICD-10-CM | POA: Diagnosis not present

## 2023-01-16 DIAGNOSIS — D509 Iron deficiency anemia, unspecified: Secondary | ICD-10-CM | POA: Diagnosis present

## 2023-01-16 DIAGNOSIS — N920 Excessive and frequent menstruation with regular cycle: Secondary | ICD-10-CM

## 2023-01-16 DIAGNOSIS — D500 Iron deficiency anemia secondary to blood loss (chronic): Secondary | ICD-10-CM

## 2023-01-16 MED ORDER — SODIUM CHLORIDE 0.9 % IV SOLN
510.0000 mg | Freq: Once | INTRAVENOUS | Status: AC
  Administered 2023-01-16: 510 mg via INTRAVENOUS
  Filled 2023-01-16: qty 17

## 2023-01-16 MED ORDER — SODIUM CHLORIDE 0.9 % IV SOLN: Freq: Once | INTRAVENOUS | Status: AC

## 2023-01-16 NOTE — Progress Notes (Signed)
Patient tolerated iron infusion with no complaints voiced.  Peripheral IV site clean and dry with good blood return noted before and after infusion.  Band aid applied.  VSS with discharge and left in satisfactory condition with no s/s of distress noted.   

## 2023-01-16 NOTE — Patient Instructions (Signed)
[facility] [place] - A DEPT OF [facility]  Discharge Instructions: Thank you for choosing [facility] to provide your oncology and hematology care.  If you have a lab appointment with the Cancer Center - please note that after April 8th, 2024, all labs will be drawn in the cancer center.  You do not have to check in or register with the main entrance as you have in the past but will complete your check-in in the cancer center.  Wear comfortable clothing and clothing appropriate for easy access to any Portacath or PICC line.   We strive to give you quality time with your provider. You may need to reschedule your appointment if you arrive late (15 or more minutes).  Arriving late affects you and other patients whose appointments are after yours.  Also, if you miss three or more appointments without notifying the office, you may be dismissed from the clinic at the provider's discretion.      For prescription refill requests, have your pharmacy contact our office and allow 72 hours for refills to be completed.    Today you received the following feraheme, return as scheduled.   To help prevent nausea and vomiting after your treatment, we encourage you to take your nausea medication as directed.  BELOW ARE SYMPTOMS THAT SHOULD BE REPORTED IMMEDIATELY: *FEVER GREATER THAN 100.4 F (38 C) OR HIGHER *CHILLS OR SWEATING *NAUSEA AND VOMITING THAT IS NOT CONTROLLED WITH YOUR NAUSEA MEDICATION *UNUSUAL SHORTNESS OF BREATH *UNUSUAL BRUISING OR BLEEDING *URINARY PROBLEMS (pain or burning when urinating, or frequent urination) *BOWEL PROBLEMS (unusual diarrhea, constipation, pain near the anus) TENDERNESS IN MOUTH AND THROAT WITH OR WITHOUT PRESENCE OF ULCERS (sore throat, sores in mouth, or a toothache) UNUSUAL RASH, SWELLING OR PAIN  UNUSUAL VAGINAL DISCHARGE OR ITCHING   Items with * indicate a potential emergency and should be followed up as soon as possible or  go to the Emergency Department if any problems should occur.  Please show the CHEMOTHERAPY ALERT CARD or IMMUNOTHERAPY ALERT CARD at check-in to the Emergency Department and triage nurse.  Should you have questions after your visit or need to cancel or reschedule your appointment, please contact [facility] [place] - A DEPT OF [facility] [phone]  and follow the prompts.  Office hours are 8:00 a.m. to 4:30 p.m. Monday - Friday. Please note that voicemails left after 4:00 p.m. may not be returned until the following business day.  We are closed weekends and major holidays. You have access to a nurse at all times for urgent questions. Please call the main number to the clinic [phone] and follow the prompts.  For any non-urgent questions, you may also contact your provider using MyChart. We now offer e-Visits for anyone 36 and older to request care online for non-urgent symptoms. For details visit [email or web address].   Also download the MyChart app! Go to the app store, search "MyChart", open the app, select , and log in with your MyChart username and password.

## 2023-01-20 ENCOUNTER — Encounter: Payer: Self-pay | Admitting: Hematology

## 2023-01-23 ENCOUNTER — Inpatient Hospital Stay: Payer: Medicaid Other | Attending: Hematology

## 2023-01-23 VITALS — BP 115/84 | HR 80 | Temp 97.7°F | Resp 18

## 2023-01-23 DIAGNOSIS — N920 Excessive and frequent menstruation with regular cycle: Secondary | ICD-10-CM

## 2023-01-23 DIAGNOSIS — D509 Iron deficiency anemia, unspecified: Secondary | ICD-10-CM | POA: Insufficient documentation

## 2023-01-23 DIAGNOSIS — D500 Iron deficiency anemia secondary to blood loss (chronic): Secondary | ICD-10-CM

## 2023-01-23 MED ORDER — SODIUM CHLORIDE 0.9 % IV SOLN
Freq: Once | INTRAVENOUS | Status: AC
Start: 2023-01-23 — End: 2023-01-23

## 2023-01-23 MED ORDER — FERUMOXYTOL INJECTION 510 MG/17 ML
510.0000 mg | Freq: Once | INTRAVENOUS | Status: AC
  Administered 2023-01-23: 510 mg via INTRAVENOUS
  Filled 2023-01-23: qty 17

## 2023-01-23 NOTE — Patient Instructions (Signed)

## 2023-01-23 NOTE — Progress Notes (Signed)
 Patient tolerated iron infusion with no complaints voiced.  Peripheral IV site clean and dry with good blood return noted before and after infusion.  Band aid applied.  VSS with discharge and left in satisfactory condition with no s/s of distress noted.

## 2023-04-01 ENCOUNTER — Encounter (INDEPENDENT_AMBULATORY_CARE_PROVIDER_SITE_OTHER): Payer: Self-pay

## 2023-04-02 NOTE — Progress Notes (Unsigned)
 Referring Provider: Wylene Men* Primary Care Physician:  Rica Records, FNP Primary GI Physician: Dr. Marletta Lor  Chief Complaint  Patient presents with   Follow-up    Follow up. Change in bowel movement. Feel nauseated sometimes. Has had some bleeding with bowel movements     HPI:   Hannah Osborn is a 38 y.o. female with history of hemorrhoids s/p surgical hemorrhoidectomy in 2018, chronic constipation, IDA, presenting today for follow-up.  Last seen in the office 03/14/2022.  Amitiza 8 mcg not controlling constipation adequately.  Reported improvement in her rectal pain since undergoing colonoscopy.  Reported rectal bleeding was a little better than prior.  She was still seeing blood at the end of finishing her bowel movement and then when she wiped.  Noted burning if using compounded hemorrhoid cream.  Recommended updating labs, increase Amitiza to 24 mcg twice daily, recommend lidocaine as needed for rectal pain/discomfort, discussed hemorrhoid banding.  Labs with hemoglobin 9.1, iron saturation 4%, iron 18, ferritin 4.  Recommended starting oral iron or either having iron infusions.   In the interim, patient underwent hysterectomy in June 2024 due to fibroid bleeding.  She followed up with hematology on 09/17/2022 who recommended Feraheme x 2 which she had in September 2024 and then most recently 2 iron infusions in December 2024 in January 2025 due to downtrending iron levels without deficiency in November though hemoglobin was stable in the 12 range.   Today:  Constipation:  Has days of normal soft bowel movements, but some days she will skip 3 days without a BM, but still taking her medications. States it is like a work-out when she has a BM. Still having rectal bleeding with bowel movements. Sometimes with burning when she applies hemorrhoid cream.   Having nausea most days. Sometimes it get so bad and she will vomit. States she used to take something for  nausea, but stopped after hysterectomy. Chronic history of intermittent nausea, but worse over the last 2 weeks. No upper abdominal pain. Has a lot of reflux. Several days a week. Fasting on soda this week.   Takes ibuprofen for back pain/muscle aching. OTC or Rx ibuprofen probably every other day.    Hold off on Given's/TCS.     EGD 11/29/2021: -Gastritis s/p biopsy -Normal duodenum -Normal esophagus -Biopsy with focal chronic gastritis, negative for H. Pylori -Advised to consider PPI therapy   Colonoscopy 11/29/2021: -Hemorrhoids on perianal exam -Nonbleeding internal hemorrhoids -Small angiodysplastic lesions without bleeding found in the descending colon and transverse colon s/p APC therapy -Advise repeat colonoscopy in 10 years -Consider hemorrhoid banding.  Past Medical History:  Diagnosis Date   Allergy    Anemia    Anxiety    Anxiety and depression 03/21/2020   COVID-19    Fibroid 01/08/2016   Hypothyroid 04/03/2017   Hypothyroidism    Screen for STD (sexually transmitted disease) 10/26/2020   Thyroid disease     Past Surgical History:  Procedure Laterality Date   BIOPSY  11/29/2021   Procedure: BIOPSY;  Surgeon: Lanelle Bal, DO;  [facility]: [facility];  Service: Endoscopy;;   CHOLECYSTECTOMY     COLONOSCOPY WITH PROPOFOL N/A 11/29/2021   Procedure: COLONOSCOPY WITH PROPOFOL;  Surgeon: Lanelle Bal, DO;  Location: [facility];  Service: Endoscopy;  Laterality: N/A;  2:15 pm, pt knows to arrive at 10:15   DILITATION & CURRETTAGE/HYSTROSCOPY WITH NOVASURE ABLATION N/A 02/02/2016   Procedure: DILATATION & CURETTAGE/HYSTEROSCOPY WITH NOVASURE  ABLATION (procedure 2);  Surgeon: Lazaro Arms, MD;  Location: [facility];  Service: Gynecology;  Laterality: N/A;   ESOPHAGOGASTRODUODENOSCOPY (EGD) WITH PROPOFOL N/A 11/29/2021   Procedure: ESOPHAGOGASTRODUODENOSCOPY (EGD) WITH PROPOFOL;  Surgeon: Lanelle Bal, DO;  Location: [facility];  Service: Endoscopy;   Laterality: N/A;   HEMORRHOID SURGERY N/A 05/01/2016   Procedure: EXTENSIVE HEMORRHOIDECTOMY;  Surgeon: Franky Macho, MD;  Location: [facility];  Service: General;  Laterality: N/A;   LAPAROSCOPIC BILATERAL SALPINGECTOMY Bilateral 02/02/2016   Procedure: LAPAROSCOPIC BILATERAL SALPINGECTOMY (procedure #1);  Surgeon: Lazaro Arms, MD;  Location: [facility];  Service: Gynecology;  Laterality: Bilateral;   ROBOTIC ASSISTED LAPAROSCOPIC HYSTERECTOMY AND SALPINGECTOMY N/A 07/03/2022   Procedure: XI ROBOTIC ASSISTED LAPAROSCOPIC HYSTERECTOMY, TAP BLOCK;  Surgeon: Lazaro Arms, MD;  Location: [facility];  Service: Gynecology;  Laterality: N/A;   TONSILLECTOMY     TUBAL LIGATION      Current Outpatient Medications  Medication Sig Dispense Refill   albuterol (VENTOLIN HFA) 108 (90 Base) MCG/ACT inhaler Inhale 1-2 puffs into the lungs every 6 (six) hours as needed for wheezing or shortness of breath. 8 g 3   DULoxetine (CYMBALTA) 30 MG capsule Take 1 capsule (30 mg total) by mouth daily. 30 capsule 3   hydrocortisone butyrate (LUCOID) 0.1 % CREA cream Apply 1 Application topically 2 (two) times daily. For 14 days 45 g 0   ibuprofen (ADVIL) 800 MG tablet Take 800 mg by mouth every 8 (eight) hours as needed.     linaclotide (LINZESS) 290 MCG CAPS capsule Take 1 capsule (290 mcg total) by mouth daily before breakfast. 30 capsule 3   methocarbamol (ROBAXIN) 500 MG tablet Take 1 tablet (500 mg total) by mouth 2 (two) times daily. 20 tablet 0   pantoprazole (PROTONIX) 40 MG tablet Take 1 tablet (40 mg total) by mouth daily before breakfast. 30 tablet 5   polyethylene glycol powder (GLYCOLAX/MIRALAX) 17 GM/SCOOP powder 1 scoop daily or as needed 255 g 11   rosuvastatin (CRESTOR) 10 MG tablet Take 1 tablet (10 mg total) by mouth daily. 90 tablet 3   No current facility-administered medications for this visit.    Allergies as of 04/03/2023   (No Known Allergies)    Family History  Problem Relation Age of Onset    Pulmonary embolism Mother    Early death Mother    Cancer Father        stomach   Early death Father    Asthma Son    Allergies Son    Heart disease Maternal Uncle    Cancer Maternal Grandmother        uterine   Cancer Maternal Grandfather        colon    Social History   Socioeconomic History   Marital status: Single    Spouse name: Not on file   Number of children: 1   Years of education: 12   Highest education level: Not on file  Occupational History   Occupation: dietary aid    Employer: [employer]  Tobacco Use   Smoking status: Never   Smokeless tobacco: Never  Vaping Use   Vaping status: Never Used  Substance and Sexual Activity   Alcohol use: Yes    Comment: occasional   Drug use: No   Sexual activity: Yes    Partners: Male    Birth control/protection: Surgical    Comment: tubal and ablation  Other Topics Concern   Not on file  Social History Narrative   Lives with son Hannah Osborn      Works in Surveyor, mining at [employer]   Social Drivers of [employer]: Low Risk  (03/21/2020)   Overall Financial Resource Strain (CARDIA)    Difficulty of Paying Living Expenses: Not hard at all  Food Insecurity: No Food Insecurity (03/21/2020)   Hunger Vital Sign    Worried About Running Out of Food in the Last Year: Never true    Ran Out of Food in the Last Year: Never true  Transportation Needs: No Transportation Needs (03/21/2020)   PRAPARE - Administrator, Civil Service (Medical): No    Lack of Transportation (Non-Medical): No  Physical Activity: Sufficiently Active (03/21/2020)   Exercise Vital Sign    Days of Exercise per Week: 6 days    Minutes of Exercise per Session: 100 min  Stress: Stress Concern Present (03/21/2020)   [employer] of Occupational Health - Occupational Stress Questionnaire    Feeling of Stress : To some extent  Social Connections: Socially Isolated (03/21/2020)   Social Connection and Isolation  Panel [NHANES]    Frequency of Communication with Friends and Family: More than three times a week    Frequency of Social Gatherings with Friends and Family: Never    Attends Religious Services: Never    Database administrator or Organizations: No    Attends Engineer, structural: Never    Marital Status: Never married    Review of Systems: Gen: Denies fever, chills, anorexia. Denies fatigue, weakness, weight loss.  CV: Denies chest pain, palpitations, syncope, peripheral edema, and claudication. Resp: Denies dyspnea at rest, cough, wheezing, coughing up blood, and pleurisy. GI: Denies vomiting blood, jaundice, and fecal incontinence.   Denies dysphagia or odynophagia. Derm: Denies rash, itching, dry skin Psych: Denies depression, anxiety, memory loss, confusion. No homicidal or suicidal ideation.  Heme: Denies bruising, bleeding, and enlarged lymph nodes.  Physical Exam: BP 118/76 (BP Location: Right Arm, Patient Position: Sitting, Cuff Size: Large)   Pulse (!) 103   Temp 97.7 F (36.5 C) (Temporal)   Ht 5\' 3"  (1.6 m)   Wt 214 lb 12.8 oz (97.4 kg)   LMP 03/14/2022 (Approximate)   BMI 38.05 kg/m  General:   Alert and oriented. No distress noted. Pleasant and cooperative.  Head:  Normocephalic and atraumatic. Eyes:  Conjuctiva clear without scleral icterus. Heart:  S1, S2 present without murmurs appreciated. Lungs:  Clear to auscultation bilaterally. No wheezes, rales, or rhonchi. No distress.  Abdomen:  +BS, soft, non-tender and non-distended. No rebound or guarding. No HSM or masses noted. Rectal: Declined.  Msk:  Symmetrical without gross deformities. Normal posture. Extremities:  Without edema. Neurologic:  Alert and  oriented x4 Psych:  Normal mood and affect.    Assessment:  38 year old female with history of hypothyroidism, anxiety, depression, chronic constipation, hemorrhoids, colonic angiodysplastic lesions, IDA, presenting today for follow-up.  Chronic  constipation: Improved, but still with breakthrough constipation on Amitiza 24 mcg twice daily.  Consider adding MiraLAX, but as she is also having nausea which is a potential side effect of Amitiza, I will try her on Linzess 290 mcg daily.  Nausea: Occurring most days.  Occasional vomiting. Denies associated abdominal pain.  Likely multifactorial in the setting of uncontrolled GERD, frequent NSAID use, history of gastritis, and possibly side effect of Amitiza.  Will change Amitiza to Linzess and start her on daily PPI.  GERD:  Uncontrolled,  not currently on any therapy.  Recommended starting pantoprazole 40 mg daily.  Rectal bleeding: Chronic, daily rectal bleeding when having bowel movements.  This may be secondary to known hemorrhoids noted on her colonoscopy in November 2023.  Unable to rule out anal fissure and patient declined rectal exam today.  Additionally, I do note that she has had small angiodysplastic lesions in her colon treated with APC in November 2023 as well.  In the setting of frequent ibuprofen use, these could also be contributing to her rectal bleeding though I suspect it is more likely anorectal etiology.  Discussed hemorrhoid banding, but patient is not sure about this.  For now, I recommended resuming hemorrhoid cream twice a day for about 7 days to see if this helps to improve her rectal bleeding.  She will follow-up in the office in about 8 weeks for reevaluation.  We did discuss the possibility of repeat endoscopic evaluation as she has continued to receive iron infusions, but she prefers to hold off on this for now.  IDA:  History of IDA, likely multifactorial in setting of chronic GI blood loss and menorrhagia/uterine fibroid bleeding.  EGD and colonoscopy in November 2023.  No significant upper GI abnormalities.  Colonoscopy with internal hemorrhoids, small angiodysplastic lesions without bleeding in the descending colon and transverse colon s/p APC therapy.  Also  underwent hysterectomy in June 2024.  She has been following with hematology and receiving IV iron as needed.  After her hysterectomy, she received IV iron x 2 in September 2024 with hemoglobin improved to 12.5 in October.  However, she received IV iron again in December in January due to downtrending iron levels though her hemoglobin had remained stable in the 12 range.   Since hysterectomy, she has had no further vaginal bleeding.  She does continue with chronic, daily rectal bleeding as discussed above which could be hemorrhoidal, but unable to rule out anal fissure or bleeding angiodysplastic lesions in her colon or possibly in her small bowel as this has not been evaluated yet.  We discussed possibility of givens capsule and possibly repeat colonoscopy, patient prefers to hold off on endoscopic evaluation for now.  She has upcoming blood work with hematology and would like to see if her numbers are staying stable. If she continues to require IV iron, she will consider endoscopic evaluation.   Plan:  Stop Amitiza. Linzess to 90 mcg daily. Limit toilet time to 2-3 minutes. Avoid straining. Start pantoprazole 40 mg daily. Follow a GERD diet:  Avoid fried, fatty, greasy, spicy, citrus foods. Avoid caffeine and carbonated beverages. Avoid chocolate. Try eating 4-6 small meals a day rather than 3 large meals. Do not eat within 3 hours of laying down. Prop head of bed up on wood or bricks to create a 6 inch incline. Limit ibuprofen is much as possible. Use Tylenol first for pain.  More than 3000 mg of Tylenol per 24 hours. Use hemorrhoid cream twice daily for the next 7 days to see if this improves rectal bleeding. Needs repeat rectal exam.  Patient declined today.  This could be performed at follow-up visit and can re-discuss hemorrhoid banding as well. Continue following closely with hematology.  May need to consider repeat endoscopic evaluation with possible colonoscopy and/or givens if patient  continues to require IV iron. Follow-up in 8 weeks.   Ermalinda Memos, PA-C [facility] 04/03/2023

## 2023-04-03 ENCOUNTER — Other Ambulatory Visit: Payer: Self-pay | Admitting: Gastroenterology

## 2023-04-03 ENCOUNTER — Ambulatory Visit (INDEPENDENT_AMBULATORY_CARE_PROVIDER_SITE_OTHER): Admitting: Gastroenterology

## 2023-04-03 ENCOUNTER — Encounter: Payer: Self-pay | Admitting: Gastroenterology

## 2023-04-03 VITALS — BP 118/76 | HR 103 | Temp 97.7°F | Ht 63.0 in | Wt 214.8 lb

## 2023-04-03 DIAGNOSIS — D509 Iron deficiency anemia, unspecified: Secondary | ICD-10-CM

## 2023-04-03 DIAGNOSIS — K219 Gastro-esophageal reflux disease without esophagitis: Secondary | ICD-10-CM

## 2023-04-03 DIAGNOSIS — Z8719 Personal history of other diseases of the digestive system: Secondary | ICD-10-CM

## 2023-04-03 DIAGNOSIS — K5909 Other constipation: Secondary | ICD-10-CM

## 2023-04-03 DIAGNOSIS — K625 Hemorrhage of anus and rectum: Secondary | ICD-10-CM | POA: Diagnosis not present

## 2023-04-03 DIAGNOSIS — R110 Nausea: Secondary | ICD-10-CM

## 2023-04-03 DIAGNOSIS — K649 Unspecified hemorrhoids: Secondary | ICD-10-CM

## 2023-04-03 DIAGNOSIS — K5904 Chronic idiopathic constipation: Secondary | ICD-10-CM

## 2023-04-03 MED ORDER — LINACLOTIDE 290 MCG PO CAPS: 290.0000 ug | ORAL_CAPSULE | Freq: Every day | ORAL | 3 refills | Status: AC

## 2023-04-03 MED ORDER — PANTOPRAZOLE SODIUM 40 MG PO TBEC: 40.0000 mg | DELAYED_RELEASE_TABLET | Freq: Every day | ORAL | 5 refills | Status: AC

## 2023-04-03 NOTE — Telephone Encounter (Signed)
 Courtney/Tammy,   Can you see what other prescriptive constipation agents are on this patient's formulary aside from Amitiza?

## 2023-04-03 NOTE — Patient Instructions (Addendum)
 Stop Amitiza and start Linzess 290 mcg daily, 30 minutes before breakfast.   Limit toilet time to 2-3 minutes.  Avoid straining.  Start Pantoprazole 40 mg daily 30 minutes before breakfast.   Follow a GERD diet:  Avoid fried, fatty, greasy, spicy, citrus foods. Avoid caffeine and carbonated beverages. Avoid chocolate. Try eating 4-6 small meals a day rather than 3 large meals. Do not eat within 3 hours of laying down. Prop head of bed up on wood or bricks to create a 6 inch incline.   Limit ibuprofen as much as possible and use tylenol first for pain.  Do not take more than 3000 mg of Tylenol per 24 hours.   We will follow-up with you in 8 weeks or sooner if needed.   Ermalinda Memos, PA-C [facility]

## 2023-04-03 NOTE — Progress Notes (Signed)
 Error

## 2023-04-04 ENCOUNTER — Encounter: Payer: Self-pay | Admitting: Hematology

## 2023-04-07 NOTE — Telephone Encounter (Signed)
 This is already been addressed.  Please see separate patient message 04/03/23.

## 2023-04-15 ENCOUNTER — Other Ambulatory Visit: Payer: Self-pay

## 2023-04-15 DIAGNOSIS — D500 Iron deficiency anemia secondary to blood loss (chronic): Secondary | ICD-10-CM

## 2023-04-16 ENCOUNTER — Inpatient Hospital Stay: Payer: Medicaid Other | Attending: Hematology

## 2023-04-16 DIAGNOSIS — D509 Iron deficiency anemia, unspecified: Secondary | ICD-10-CM | POA: Diagnosis present

## 2023-04-16 DIAGNOSIS — D500 Iron deficiency anemia secondary to blood loss (chronic): Secondary | ICD-10-CM

## 2023-04-16 LAB — CBC WITH DIFFERENTIAL/PLATELET
Abs Immature Granulocytes: 0.02 10*3/uL (ref 0.00–0.07)
Basophils Absolute: 0 10*3/uL (ref 0.0–0.1)
Basophils Relative: 1 %
Eosinophils Absolute: 0 10*3/uL (ref 0.0–0.5)
Eosinophils Relative: 1 %
HCT: 36 % (ref 36.0–46.0)
Hemoglobin: 11.8 g/dL — ABNORMAL LOW (ref 12.0–15.0)
Immature Granulocytes: 0 %
Lymphocytes Relative: 31 %
Lymphs Abs: 1.8 10*3/uL (ref 0.7–4.0)
MCH: 27.5 pg (ref 26.0–34.0)
MCHC: 32.8 g/dL (ref 30.0–36.0)
MCV: 83.9 fL (ref 80.0–100.0)
Monocytes Absolute: 0.5 10*3/uL (ref 0.1–1.0)
Monocytes Relative: 8 %
Neutro Abs: 3.5 10*3/uL (ref 1.7–7.7)
Neutrophils Relative %: 59 %
Platelets: 255 10*3/uL (ref 150–400)
RBC: 4.29 MIL/uL (ref 3.87–5.11)
RDW: 15 % (ref 11.5–15.5)
WBC: 5.9 10*3/uL (ref 4.0–10.5)
nRBC: 0 % (ref 0.0–0.2)

## 2023-04-16 LAB — FERRITIN: Ferritin: 160 ng/mL (ref 11–307)

## 2023-04-16 LAB — IRON AND TIBC
Iron: 55 ug/dL (ref 28–170)
Saturation Ratios: 17 % (ref 10.4–31.8)
TIBC: 320 ug/dL (ref 250–450)
UIBC: 265 ug/dL

## 2023-04-17 ENCOUNTER — Inpatient Hospital Stay: Payer: Medicaid Other | Admitting: Oncology

## 2023-04-17 NOTE — Progress Notes (Deleted)
 I connected with Hannah Osborn on 04/17/23 at  2:00 PM EDT by telephone visit and verified that I am speaking with the correct person using two identifiers.   I discussed the limitations, risks, security and privacy concerns of performing an evaluation and management service by telemedicine and the availability of in-person appointments. I also discussed with the patient that there may be a patient responsible charge related to this service. The patient expressed understanding and agreed to proceed.   Other persons participating in the visit and their role in the encounter: NP, Patient    Patient's location: Home Provider's location: Clinic  [facility] [address] [place] [address]   Clinic Day:  04/17/2023  Referring physician: Wylene Osborn*  Patient Care Team: Hannah Osborn, Hannah Child, FNP as PCP - General (Family Medicine) Hannah Bali, MD (Inactive) as Consulting Physician (Gastroenterology) Hannah Bal, DO as Consulting Physician (Gastroenterology)   ASSESSMENT & PLAN:   Assessment:  1.  Iron deficiency anemia: - Patient had hysterectomy in June 2024 secondary to fibroid bleeding. - Cannot take oral iron therapy secondary to constipation. - Positive for tiredness, ice pica.  No prior blood transfusion.  Received IV Feraheme in 2017 and again recently in September 2024. - Occasional bleeding per rectum when she is constipated.  2. Social/Family History: -Works in the school system, including some physical labor. No tobacco use.  -Mother had anemia and sickle cell trait. Father had stomach cancer.  Maternal grandfather had colon cancer. Maternal grandmother had uterine cancer.    Plan:  1.  Iron deficiency anemia: - She received 2 doses of IV Feraheme on 09/26/2022 and 10/03/2022. -Unable to tolerate oral iron secondary to GI upset. -Overall labs have improved since September although iron levels are starting to trend back down  from lab work in October.  Labs from 12/10/2022 show hemoglobin of 12.1 (12.7), MCV 75.6 (76), normal platelet count, iron saturations 21% and ferritin 46 (107).  -She denies any bleeding.  -Recommend 2 additional doses of IV iron - RTC 3 months for follow-up with repeat CBC, ferritin and iron panel.   No orders of the defined types were placed in this encounter.  I provided 20 minutes of non face-to-face telephone visit time during this encounter, and > 50% was spent counseling as documented under my assessment & plan.     CHIEF COMPLAINT/PURPOSE OF CONSULT:   Diagnosis: Iron deficiency anemia  Current Therapy: IV Feraheme  HISTORY OF PRESENT ILLNESS:   Hannah Osborn is a 38 y.o. female presenting to clinic today for follow-up for iron deficiency anemia.  She received 2 doses of IV Feraheme in September 2024 with good tolerance.  Today, she states that she is doing well overall. Her appetite level is at 100%. Her energy level is at 75%.  Of note she underwent a total hysterectomy on 07/03/22 with Dr. Despina Osborn due to fibroid issues.  She denies any recent hospitalizations, surgeries or changes to her baseline health.  Notes improvement of her energy levels since receiving iron.  Denies any bleeding.  Overall feels much improved.  Has occasional constipation and takes laxatives as needed.   PAST MEDICAL HISTORY:   Past Medical History: Past Medical History:  Diagnosis Date   Allergy    Anemia    Anxiety    Anxiety and depression 03/21/2020   COVID-19    Fibroid 01/08/2016   Hypothyroid 04/03/2017   Hypothyroidism    Screen for STD (sexually  transmitted disease) 10/26/2020   Thyroid disease     Surgical History: Past Surgical History:  Procedure Laterality Date   BIOPSY  11/29/2021   Procedure: BIOPSY;  Surgeon: Hannah Bal, DO;  [facility];  Service: Endoscopy;;   CHOLECYSTECTOMY     COLONOSCOPY WITH PROPOFOL N/A 11/29/2021   Procedure: COLONOSCOPY WITH  PROPOFOL;  Surgeon: Hannah Bal, DO;  [facility];  Service: Endoscopy;  Laterality: N/A;  2:15 pm, pt knows to arrive at 10:15   DILITATION & CURRETTAGE/HYSTROSCOPY WITH NOVASURE ABLATION N/A 02/02/2016   Procedure: DILATATION & CURETTAGE/HYSTEROSCOPY WITH NOVASURE ABLATION (procedure 2);  Surgeon: Hannah Arms, MD;  Location: [facility];  Service: Gynecology;  Laterality: N/A;   ESOPHAGOGASTRODUODENOSCOPY (EGD) WITH PROPOFOL N/A 11/29/2021   Procedure: ESOPHAGOGASTRODUODENOSCOPY (EGD) WITH PROPOFOL;  Surgeon: Hannah Bal, DO;  [facility];  Service: Endoscopy;  Laterality: N/A;   HEMORRHOID SURGERY N/A 05/01/2016   Procedure: EXTENSIVE HEMORRHOIDECTOMY;  Surgeon: Hannah Macho, MD;  Location: [facility];  Service: General;  Laterality: N/A;   LAPAROSCOPIC BILATERAL SALPINGECTOMY Bilateral 02/02/2016   Procedure: LAPAROSCOPIC BILATERAL SALPINGECTOMY (procedure #1);  Surgeon: Hannah Arms, MD;  Location: [facility];  Service: Gynecology;  Laterality: Bilateral;   ROBOTIC ASSISTED LAPAROSCOPIC HYSTERECTOMY AND SALPINGECTOMY N/A 07/03/2022   Procedure: XI ROBOTIC ASSISTED LAPAROSCOPIC HYSTERECTOMY, TAP BLOCK;  Surgeon: Hannah Arms, MD;  Location: [facility];  Service: Gynecology;  Laterality: N/A;   TONSILLECTOMY     TUBAL LIGATION      Social History: Social History   Socioeconomic History   Marital status: Single    Spouse name: Not on file   Number of children: 1   Years of education: 12   Highest education level: Not on file  Occupational History   Occupation: dietary aid    Employer: [employer]  Tobacco Use   Smoking status: Never   Smokeless tobacco: Never  Vaping Use   Vaping status: Never Used  Substance and Sexual Activity   Alcohol use: Yes    Comment: occasional   Drug use: No   Sexual activity: Yes    Partners: Male    Birth control/protection: Surgical    Comment: tubal and ablation  Other Topics Concern   Not on file  Social  History Narrative   Lives with son Hannah Osborn      Works in Surveyor, mining at [employer]   Social Drivers of Health   Financial Resource Strain: Low Risk  (03/21/2020)   Overall Financial Resource Strain (CARDIA)    Difficulty of Paying Living Expenses: Not hard at all  Food Insecurity: No Food Insecurity (03/21/2020)   Hunger Vital Sign    Worried About Running Out of Food in the Last Year: Never true    Ran Out of Food in the Last Year: Never true  Transportation Needs: No Transportation Needs (03/21/2020)   PRAPARE - Administrator, Civil Service (Medical): No    Lack of Transportation (Non-Medical): No  Physical Activity: Sufficiently Active (03/21/2020)   Exercise Vital Sign    Days of Exercise per Week: 6 days    Minutes of Exercise per Session: 100 min  Stress: Stress Concern Present (03/21/2020)   [employer] of Occupational Health - Occupational Stress Questionnaire    Feeling of Stress : To some extent  Social Connections: Socially Isolated (03/21/2020)   Social Connection and Isolation Panel [NHANES]    Frequency of Communication with Friends  and Family: More than three times a week    Frequency of Social Gatherings with Friends and Family: Never    Attends Religious Services: Never    Database administrator or Organizations: No    Attends Banker Meetings: Never    Marital Status: Never married  Intimate Partner Violence: Not At Risk (03/21/2020)   Humiliation, Afraid, Rape, and Kick questionnaire    Fear of Current or Ex-Partner: No    Emotionally Abused: No    Physically Abused: No    Sexually Abused: No    Family History: Family History  Problem Relation Age of Onset   Pulmonary embolism Mother    Early death Mother    Cancer Father        stomach   Early death Father    Asthma Son    Allergies Son    Heart disease Maternal Uncle    Cancer Maternal Grandmother        uterine   Cancer Maternal Grandfather        colon    Current  Medications:  Current Outpatient Medications:    albuterol (VENTOLIN HFA) 108 (90 Base) MCG/ACT inhaler, Inhale 1-2 puffs into the lungs every 6 (six) hours as needed for wheezing or shortness of breath., Disp: 8 g, Rfl: 3   DULoxetine (CYMBALTA) 30 MG capsule, Take 1 capsule (30 mg total) by mouth daily., Disp: 30 capsule, Rfl: 3   hydrocortisone butyrate (LUCOID) 0.1 % CREA cream, Apply 1 Application topically 2 (two) times daily. For 14 days, Disp: 45 g, Rfl: 0   ibuprofen (ADVIL) 800 MG tablet, Take 800 mg by mouth every 8 (eight) hours as needed., Disp: , Rfl:    linaclotide (LINZESS) 290 MCG CAPS capsule, Take 1 capsule (290 mcg total) by mouth daily before breakfast., Disp: 30 capsule, Rfl: 3   methocarbamol (ROBAXIN) 500 MG tablet, Take 1 tablet (500 mg total) by mouth 2 (two) times daily., Disp: 20 tablet, Rfl: 0   pantoprazole (PROTONIX) 40 MG tablet, Take 1 tablet (40 mg total) by mouth daily before breakfast., Disp: 30 tablet, Rfl: 5   polyethylene glycol powder (GLYCOLAX/MIRALAX) 17 GM/SCOOP powder, 1 scoop daily or as needed, Disp: 255 g, Rfl: 11   rosuvastatin (CRESTOR) 10 MG tablet, Take 1 tablet (10 mg total) by mouth daily., Disp: 90 tablet, Rfl: 3   Allergies: No Known Allergies  REVIEW OF SYSTEMS:   Review of Systems  Constitutional:  Negative for fatigue.  Gastrointestinal:  Positive for constipation. Negative for abdominal distention, blood in stool, diarrhea and nausea.  Genitourinary:  Negative for hematuria.      VITALS:   Last menstrual period 03/14/2022.  Wt Readings from Last 3 Encounters:  04/03/23 214 lb 12.8 oz (97.4 kg)  11/15/22 210 lb 0.5 oz (95.3 kg)  09/19/22 210 lb (95.3 kg)    There is no height or weight on file to calculate BMI.   PHYSICAL EXAM:   Physical Exam Neurological:     Mental Status: She is alert and oriented to person, place, and time.     LABS:      Latest Ref Rng & Units 04/16/2023    2:52 PM 12/10/2022    3:56 PM  11/19/2022    8:23 AM  CBC  WBC 4.0 - 10.5 K/uL 5.9  5.3  8.6   Hemoglobin 12.0 - 15.0 g/dL 78.2  95.6  21.3   Hematocrit 36.0 - 46.0 % 36.0  36.6  39.6  Platelets 150 - 400 K/uL 255  269  305       Latest Ref Rng & Units 08/23/2022   10:31 AM 06/28/2022    8:19 AM 04/15/2019    4:01 PM  CMP  Glucose 70 - 99 mg/dL 92  82  90   BUN 6 - 20 mg/dL 8  16  13    Creatinine 0.57 - 1.00 mg/dL 4.09  8.11  9.14   Sodium 134 - 144 mmol/L 138  137  139   Potassium 3.5 - 5.2 mmol/L 4.5  3.3  3.5   Chloride 96 - 106 mmol/L 104  104  105   CO2 20 - 29 mmol/L 22  24  27    Calcium 8.7 - 10.2 mg/dL 9.6  9.1  9.4   Total Protein 6.5 - 8.1 g/dL  7.1    Total Bilirubin 0.3 - 1.2 mg/dL  0.3    Alkaline Phos 38 - 126 U/L  48    AST 15 - 41 U/L  14    ALT 0 - 44 U/L  16       No results found for: "CEA1", "CEA" / No results found for: "CEA1", "CEA" No results found for: "PSA1" No results found for: "NWG956" No results found for: "CAN125"  No results found for: "TOTALPROTELP", "ALBUMINELP", "A1GS", "A2GS", "BETS", "BETA2SER", "GAMS", "MSPIKE", "SPEI" Lab Results  Component Value Date   TIBC 320 04/16/2023   TIBC 339 12/10/2022   TIBC 296 11/19/2022   FERRITIN 160 04/16/2023   FERRITIN 46 12/10/2022   FERRITIN 107 11/19/2022   IRONPCTSAT 17 04/16/2023   IRONPCTSAT 21 12/10/2022   IRONPCTSAT 25 11/19/2022   No results found for: "LDH"   STUDIES:   No results found.

## 2023-04-28 ENCOUNTER — Other Ambulatory Visit (HOSPITAL_COMMUNITY)
Admission: RE | Admit: 2023-04-28 | Discharge: 2023-04-28 | Disposition: A | Discharge status: Home / Self Care | Source: Ambulatory Visit | Attending: Adult Health | Admitting: Adult Health

## 2023-04-28 ENCOUNTER — Ambulatory Visit: Admitting: Adult Health

## 2023-04-28 ENCOUNTER — Encounter: Payer: Self-pay | Admitting: Adult Health

## 2023-04-28 ENCOUNTER — Encounter: Payer: Self-pay | Admitting: Hematology

## 2023-04-28 VITALS — BP 119/81 | HR 90 | Ht 63.0 in | Wt 214.5 lb

## 2023-04-28 DIAGNOSIS — R109 Unspecified abdominal pain: Secondary | ICD-10-CM | POA: Diagnosis not present

## 2023-04-28 DIAGNOSIS — R319 Hematuria, unspecified: Secondary | ICD-10-CM

## 2023-04-28 DIAGNOSIS — Z113 Encounter for screening for infections with a predominantly sexual mode of transmission: Secondary | ICD-10-CM | POA: Insufficient documentation

## 2023-04-28 LAB — POCT URINALYSIS DIPSTICK
Glucose, UA: NEGATIVE
Ketones, UA: NEGATIVE
Leukocytes, UA: NEGATIVE
Nitrite, UA: NEGATIVE
Protein, UA: NEGATIVE

## 2023-04-28 NOTE — Progress Notes (Signed)
  Subjective:     Patient ID: Hannah Osborn, female   DOB: 1985/03/27, 38 y.o.   MRN: [id number]  HPI Crist Infante is a 38 year old black female,single, sp hysterectomy in complaining of abdominal pressure low and wants STD testing.  PCP is I Polanco FNP   Review of Systems +low abdominal pressure  Reviewed past medical,surgical, social and family history. Reviewed medications and allergies.     Objective:   Physical Exam BP 119/81 (BP Location: Right Arm, Patient Position: Sitting, Cuff Size: Large)   Pulse 90   Ht 5\' 3"  (1.6 m)   Wt 214 lb 8 oz (97.3 kg)   LMP 03/14/2022 (Approximate)   BMI 38.00 kg/m  urine dipstick trace blood   Skin warm and dry.Pelvic: external genitalia is normal in appearance no lesions, vagina: pink,urethra has no lesions or masses noted, cervix and uterus are absent,adnexa: no masses or tenderness noted. Bladder +pressure with palpation and  no masses felt. CV swab obtained.   Upstream - 04/28/23 1517       Pregnancy Intention Screening   Does the patient want to become pregnant in the next year? N/A    Does the patient's partner want to become pregnant in the next year? N/A    Would the patient like to discuss contraceptive options today? N/A      Contraception Wrap Up   Current Method Female Sterilization   hyst   End Method Female Sterilization   hyst   Contraception Counseling Provided No            Examination chaperoned by Malachy Mood LPN  Assessment:     1. Abdominal pressure (Primary) +pressure over bladder UA C&S sent  - POCT Urinalysis Dipstick - Urine Culture - Urinalysis, Routine w reflex microscopic If pressure persists may get [place] to assess ovaries  2. Hematuria, unspecified type UA C&S sent to rule out UTI  Push fluids  - POCT Urinalysis Dipstick - Urine Culture - Urinalysis, Routine w reflex microscopic  3. Screening examination for STD (sexually transmitted disease) CV swab sent for GC/CHL,trich,BV and yeast  Check HIV and  RPR  - Cervicovaginal ancillary only( ) - HIV Antibody (routine testing w rflx) - RPR     Plan:     Follow up prn

## 2023-04-29 LAB — URINALYSIS, ROUTINE W REFLEX MICROSCOPIC
Bilirubin, UA: NEGATIVE
Glucose, UA: NEGATIVE
Ketones, UA: NEGATIVE
Leukocytes,UA: NEGATIVE
Nitrite, UA: NEGATIVE
Protein,UA: NEGATIVE
RBC, UA: NEGATIVE
Specific Gravity, UA: 1.017 (ref 1.005–1.030)
Urobilinogen, Ur: 0.2 mg/dL (ref 0.2–1.0)
pH, UA: 7 (ref 5.0–7.5)

## 2023-04-29 LAB — HIV ANTIBODY (ROUTINE TESTING W REFLEX): HIV Screen 4th Generation wRfx: NONREACTIVE

## 2023-04-29 LAB — RPR: RPR Ser Ql: NONREACTIVE

## 2023-04-30 LAB — CERVICOVAGINAL ANCILLARY ONLY
Bacterial Vaginitis (gardnerella): NEGATIVE
Candida Glabrata: NEGATIVE
Candida Vaginitis: NEGATIVE
Chlamydia: NEGATIVE
Comment: NEGATIVE
Comment: NEGATIVE
Comment: NEGATIVE
Comment: NEGATIVE
Comment: NEGATIVE
Comment: NORMAL
Neisseria Gonorrhea: NEGATIVE
Trichomonas: NEGATIVE

## 2023-04-30 NOTE — Progress Notes (Signed)
 [facility] [address], [place] [address]   CLINIC:  Medical Oncology/Hematology  PCP:  Rosanna Comment, FNP [phone] [address] [place] [address] [phone]   REASON FOR VISIT:  Follow-up for iron deficiency anemia  PRIOR THERAPY: Oral iron (unable to tolerate due to GI upset)  CURRENT THERAPY: Intermittent IV iron  INTERVAL HISTORY:   Hannah Osborn 38 y.o. female returns for routine follow-up of iron deficiency anemia.  She was last seen by NP Charlton Cooler on 01/14/2023.  She felt some improved energy after IV Feraheme in December and January.  However, she has had some recurrent fatigue for the past few weeks.  She previously had issues with heavy menstrual periods (uterine fibroids), but this resolved after hysterectomy in June 2024.  She does report bright red blood dripping into the toilet with most bowel movements, and reports that she strains due to constipation.  No pica, headaches, lightheadedness, syncope, chest pain, or dyspnea on exertion.  She has 50% energy and 100% appetite. She endorses that she is maintaining a stable weight.  ASSESSMENT & PLAN:  1.  Iron deficiency anemia: - No prior blood transfusion. - History of menorrhagia due to uterine fibroids, s/p hysterectomy in June 2024 - Patient chart lists that patient's mother had history of sickle cell trait, but patient denies any knowledge of this. - Rectal bleeding when straining for bowel movements, following with GI Shana Daring, PA-C) for constipation/hematochezia  - Cannot take oral iron therapy secondary to constipation. - Most recent IV Feraheme x 2 in December 2024 to January 2025 - Positive for fatigue - Most recent labs (04/16/2023): Hgb 11.8/MCV 83.9, ferritin 160, iron saturation 17% - PLAN: We discussed possible utility of IV iron in the setting of fatigue with Hgb <12.0 and iron saturation <20%, although ferritin is at goal (>100).  Patient will hold off on  IV iron at this time.  Repeat labs with PHONE visit in 4 months.   (If persistent anemia and microcytosis despite continued adequate iron, would consider testing for hemoglobinopathy.)  2.  Social/Family History: - Works in the school system, including some physical labor. No tobacco use.  - Mother had anemia and sickle cell trait (questionable?). Father had stomach cancer.  Maternal grandfather had colon cancer. Maternal grandmother had uterine cancer.   PLAN SUMMARY: >> Labs in 4 months = CBC/D, ferritin, iron/TIBC >> PHONE visit in 4 months (1 week after labs)     REVIEW OF SYSTEMS:   Review of Systems  Constitutional:  Positive for fatigue. Negative for appetite change, chills, diaphoresis, fever and unexpected weight change.  HENT:   Negative for lump/mass and nosebleeds.   Eyes:  Negative for eye problems.  Respiratory:  Negative for cough, hemoptysis and shortness of breath.   Cardiovascular:  Negative for chest pain, leg swelling and palpitations.  Gastrointestinal:  Positive for blood in stool and constipation. Negative for abdominal pain, diarrhea, nausea and vomiting.  Genitourinary:  Negative for hematuria.   Skin: Negative.   Neurological:  Negative for dizziness, headaches and light-headedness.  Hematological:  Does not bruise/bleed easily.     PHYSICAL EXAM:  ECOG PERFORMANCE STATUS: 1 - Symptomatic but completely ambulatory  Vitals:   05/05/23 0800  BP: 116/86  Pulse: 88  Resp: 16  Temp: 98.5 F (36.9 C)  SpO2: 97%   Filed Weights   05/05/23 0800  Weight: 215 lb 2.7 oz (97.6 kg)   Physical Exam Constitutional:  Appearance: Normal appearance. She is obese.  Cardiovascular:     Heart sounds: Normal heart sounds.  Pulmonary:     Breath sounds: Normal breath sounds.  Neurological:     General: No focal deficit present.     Mental Status: Mental status is at baseline.  Psychiatric:        Behavior: Behavior normal. Behavior is cooperative.      PAST MEDICAL/SURGICAL HISTORY:  Past Medical History:  Diagnosis Date   Allergy    Anemia    Anxiety    Anxiety and depression 03/21/2020   COVID-19    Fibroid 01/08/2016   Hypothyroid 04/03/2017   Hypothyroidism    Screen for STD (sexually transmitted disease) 10/26/2020   Thyroid disease    Past Surgical History:  Procedure Laterality Date   BIOPSY  11/29/2021   Procedure: BIOPSY;  Surgeon: Vinetta Greening, DO;  [facility];  Service: Endoscopy;;   CHOLECYSTECTOMY     COLONOSCOPY WITH PROPOFOL N/A 11/29/2021   Procedure: COLONOSCOPY WITH PROPOFOL;  Surgeon: Vinetta Greening, DO;  [facility];  Service: Endoscopy;  Laterality: N/A;  2:15 pm, pt knows to arrive at 10:15   DILITATION & CURRETTAGE/HYSTROSCOPY WITH NOVASURE ABLATION N/A 02/02/2016   Procedure: DILATATION & CURETTAGE/HYSTEROSCOPY WITH NOVASURE ABLATION (procedure 2);  Surgeon: Wendelyn Halter, MD;  Location: [facility];  Service: Gynecology;  Laterality: N/A;   ESOPHAGOGASTRODUODENOSCOPY (EGD) WITH PROPOFOL N/A 11/29/2021   Procedure: ESOPHAGOGASTRODUODENOSCOPY (EGD) WITH PROPOFOL;  Surgeon: Vinetta Greening, DO;  [facility];  Service: Endoscopy;  Laterality: N/A;   HEMORRHOID SURGERY N/A 05/01/2016   Procedure: EXTENSIVE HEMORRHOIDECTOMY;  Surgeon: Alanda Allegra, MD;  Location: [facility];  Service: General;  Laterality: N/A;   LAPAROSCOPIC BILATERAL SALPINGECTOMY Bilateral 02/02/2016   Procedure: LAPAROSCOPIC BILATERAL SALPINGECTOMY (procedure #1);  Surgeon: Wendelyn Halter, MD;  Location: [facility];  Service: Gynecology;  Laterality: Bilateral;   ROBOTIC ASSISTED LAPAROSCOPIC HYSTERECTOMY AND SALPINGECTOMY N/A 07/03/2022   Procedure: XI ROBOTIC ASSISTED LAPAROSCOPIC HYSTERECTOMY, TAP BLOCK;  Surgeon: Wendelyn Halter, MD;  Location: [facility];  Service: Gynecology;  Laterality: N/A;   TONSILLECTOMY     TUBAL LIGATION      SOCIAL HISTORY:  Social History   Socioeconomic History   Marital  status: Single    Spouse name: Not on file   Number of children: 1   Years of education: 12   Highest education level: Not on file  Occupational History   Occupation: dietary aid    Employer: [employer]  Tobacco Use   Smoking status: Never   Smokeless tobacco: Never  Vaping Use   Vaping status: Never Used  Substance and Sexual Activity   Alcohol use: Yes    Comment: occasional   Drug use: No   Sexual activity: Yes    Partners: Male    Birth control/protection: Surgical    Comment: hyst  Other Topics Concern   Not on file  Social History Narrative   Lives with son Dave Erie      Works in Surveyor, mining at [employer]   Social Drivers of Health   Financial Resource Strain: Low Risk  (03/21/2020)   Overall Financial Resource Strain (CARDIA)    Difficulty of Paying Living Expenses: Not hard at all  Food Insecurity: No Food Insecurity (03/21/2020)   Hunger Vital Sign    Worried About Running Out of Food in the Last Year: Never true    Ran Out of  Food in the Last Year: Never true  Transportation Needs: No Transportation Needs (03/21/2020)   PRAPARE - Administrator, Civil Service (Medical): No    Lack of Transportation (Non-Medical): No  Physical Activity: Sufficiently Active (03/21/2020)   Exercise Vital Sign    Days of Exercise per Week: 6 days    Minutes of Exercise per Session: 100 min  Stress: Stress Concern Present (03/21/2020)   [employer] of Occupational Health - Occupational Stress Questionnaire    Feeling of Stress : To some extent  Social Connections: Socially Isolated (03/21/2020)   Social Connection and Isolation Panel [NHANES]    Frequency of Communication with Friends and Family: More than three times a week    Frequency of Social Gatherings with Friends and Family: Never    Attends Religious Services: Never    Database administrator or Organizations: No    Attends Banker Meetings: Never    Marital Status: Never married   Intimate Partner Violence: Not At Risk (03/21/2020)   Humiliation, Afraid, Rape, and Kick questionnaire    Fear of Current or Ex-Partner: No    Emotionally Abused: No    Physically Abused: No    Sexually Abused: No    FAMILY HISTORY:  Family History  Problem Relation Age of Onset   Pulmonary embolism Mother    Early death Mother    Cancer Father        stomach   Early death Father    Asthma Son    Allergies Son    Heart disease Maternal Uncle    Cancer Maternal Grandmother        uterine   Cancer Maternal Grandfather        colon    CURRENT MEDICATIONS:  Outpatient Encounter Medications as of 05/05/2023  Medication Sig   albuterol (VENTOLIN HFA) 108 (90 Base) MCG/ACT inhaler Inhale 1-2 puffs into the lungs every 6 (six) hours as needed for wheezing or shortness of breath.   DULoxetine (CYMBALTA) 30 MG capsule Take 1 capsule (30 mg total) by mouth daily.   ibuprofen (ADVIL) 800 MG tablet Take 800 mg by mouth every 8 (eight) hours as needed.   linaclotide (LINZESS) 290 MCG CAPS capsule Take 1 capsule (290 mcg total) by mouth daily before breakfast.   methocarbamol (ROBAXIN) 500 MG tablet Take 1 tablet (500 mg total) by mouth 2 (two) times daily.   pantoprazole (PROTONIX) 40 MG tablet Take 1 tablet (40 mg total) by mouth daily before breakfast.   polyethylene glycol powder (GLYCOLAX/MIRALAX) 17 GM/SCOOP powder 1 scoop daily or as needed   rosuvastatin (CRESTOR) 10 MG tablet Take 1 tablet (10 mg total) by mouth daily.   No facility-administered encounter medications on file as of 05/05/2023.    ALLERGIES:  No Known Allergies  LABORATORY DATA:  I have reviewed the labs as listed.  CBC    Component Value Date/Time   WBC 5.9 04/16/2023 1452   RBC 4.29 04/16/2023 1452   HGB 11.8 (L) 04/16/2023 1452   HGB 12.5 11/19/2022 0823   HCT 36.0 04/16/2023 1452   HCT 39.6 11/19/2022 0823   PLT 255 04/16/2023 1452   PLT 305 11/19/2022 0823   MCV 83.9 04/16/2023 1452   MCV 76 (L)  11/19/2022 0823   MCH 27.5 04/16/2023 1452   MCHC 32.8 04/16/2023 1452   RDW 15.0 04/16/2023 1452   RDW 19.1 (H) 08/23/2022 1031   LYMPHSABS 1.8 04/16/2023 1452   LYMPHSABS 2.8 11/19/2022  0823   MONOABS 0.5 04/16/2023 1452   EOSABS 0.0 04/16/2023 1452   EOSABS 0.0 11/19/2022 0823   BASOSABS 0.0 04/16/2023 1452   BASOSABS 0.1 11/19/2022 0823      Latest Ref Rng & Units 08/23/2022   10:31 AM 06/28/2022    8:19 AM 04/15/2019    4:01 PM  CMP  Glucose 70 - 99 mg/dL 92  82  90   BUN 6 - 20 mg/dL 8  16  13    Creatinine 0.57 - 1.00 mg/dL 1.61  0.96  0.45   Sodium 134 - 144 mmol/L 138  137  139   Potassium 3.5 - 5.2 mmol/L 4.5  3.3  3.5   Chloride 96 - 106 mmol/L 104  104  105   CO2 20 - 29 mmol/L 22  24  27    Calcium 8.7 - 10.2 mg/dL 9.6  9.1  9.4   Total Protein 6.5 - 8.1 g/dL  7.1    Total Bilirubin 0.3 - 1.2 mg/dL  0.3    Alkaline Phos 38 - 126 U/L  48    AST 15 - 41 U/L  14    ALT 0 - 44 U/L  16      DIAGNOSTIC IMAGING:  I have independently reviewed the relevant imaging and discussed with the patient.   WRAP UP:  All questions were answered. The patient knows to call the clinic with any problems, questions or concerns.  Medical decision making: Low  Time spent on visit: I spent 15 minutes counseling the patient face to face. The total time spent in the appointment was 22 minutes and more than 50% was on counseling.  Sonnie Dusky, PA-C  05/05/23 12:54 PM

## 2023-05-05 ENCOUNTER — Inpatient Hospital Stay: Attending: Physician Assistant | Admitting: Physician Assistant

## 2023-05-05 VITALS — BP 116/86 | HR 88 | Temp 98.5°F | Resp 16 | Wt 215.2 lb

## 2023-05-05 DIAGNOSIS — Z79899 Other long term (current) drug therapy: Secondary | ICD-10-CM | POA: Insufficient documentation

## 2023-05-05 DIAGNOSIS — D509 Iron deficiency anemia, unspecified: Secondary | ICD-10-CM | POA: Diagnosis present

## 2023-05-05 DIAGNOSIS — D500 Iron deficiency anemia secondary to blood loss (chronic): Secondary | ICD-10-CM | POA: Diagnosis not present

## 2023-05-13 ENCOUNTER — Other Ambulatory Visit: Payer: Self-pay | Admitting: *Deleted

## 2023-05-13 DIAGNOSIS — R319 Hematuria, unspecified: Secondary | ICD-10-CM

## 2023-05-13 DIAGNOSIS — R109 Unspecified abdominal pain: Secondary | ICD-10-CM

## 2023-05-16 LAB — URINE CULTURE

## 2023-05-29 ENCOUNTER — Ambulatory Visit (INDEPENDENT_AMBULATORY_CARE_PROVIDER_SITE_OTHER): Admitting: Gastroenterology

## 2023-05-29 ENCOUNTER — Encounter: Payer: Self-pay | Admitting: Gastroenterology

## 2023-05-29 VITALS — BP 109/76 | HR 108 | Temp 98.5°F | Ht 63.0 in | Wt 214.2 lb

## 2023-05-29 DIAGNOSIS — K641 Second degree hemorrhoids: Secondary | ICD-10-CM

## 2023-05-29 DIAGNOSIS — K625 Hemorrhage of anus and rectum: Secondary | ICD-10-CM

## 2023-05-29 NOTE — Progress Notes (Signed)
   [facility] Banding Procedure Note:   Hannah Osborn is a 38 y.o. female presenting today for consideration of hemorrhoid banding. Last colonoscopy 2023 with small angiodysplastic lesion treated with APC therapy.  At last office visit in March she was encouraged to continue to follow closely with hematology in regards to her anemia and advised I may need to consider repeat colonoscopy and/or givens if she continues to require IV iron.  She is advised to use hemorrhoid cream twice daily for 7 days to see if it improves rectal bleeding. Believe she is taking Linzess  but unsure what dosage - med list states 290. Will continue this to help with bloating. Still struggling some with complete emptying.   Latex Allergy: NO  The patient presents with symptomatic grade 2 hemorrhoids, unresponsive to maximal medical therapy, requesting rubber band ligation of his/her hemorrhoidal disease. All risks, benefits, and alternative forms of therapy were described and informed consent was obtained.  In the left lateral decubitus position anoscopic examination revealed grade 2 hemorrhoids in the right posterior position. Unable to view the other hemorrhoid columns. External examination revealed external skin tissue and mild mucosal prolapse to right posterior and left lateral region. Tight anal sphincter tone noted on DRE.   The decision was made to band the right posterior internal hemorrhoid, and the [facility] O'Regan System was used to perform band ligation without complication. Digital anorectal examination was then performed to assure proper positioning of the band, and to adjust the banded tissue as required. The patient was discharged home without pain or other issues. Dietary and behavioral recommendations were given along with follow-up instructions. The patient will return in 2-3 weeks for followup and possible additional banding as required.  No complications were encountered and the patient tolerated the procedure well.    Given tight anal sphincter tone and constipation may need to consider rectal manometry vs flexible sigmoidoscopy given ongoing rectal bleeding and anemia with constipation issues.    [name], MSN, FNP-BC, AGACNP-BC [facility]

## 2023-05-29 NOTE — Patient Instructions (Signed)
 Continue to avoid straining. Limit toilet time to 2-3 minutes at the most. [place] Linzesss. Let me know if you are taking 72, 145, or 290 mcg.   Avoid constipation. Take 2 tablespoons of natural wheat bran, natural oat bran, flax, Benefiber or any over the counter fiber supplement and increase your water  intake to 7-8 glasses daily.  Occasionally, you may have more bleeding than usual after the banding procedure. This is often from the untreated hemorrhoids rather than the treated one. Don't be concerned if there is a tablespoon or so of blood. If there is more blood than this, lie flat with your bottom higher than your head and apply an ice pack to the area. If the bleeding does not stop within a half an hour or if you feel faint, have severe pain, chills, fever or difficulty passing urine (very rare) or other problems, you should call us  at [phone] or report to the nearest emergency room. Please call me with any concerns!  The procedure you have had should have been relatively painless since the banding of the area involved does not have nerve endings and there is no pain sensation. The rubber band cuts off the blood supply to the hemorrhoid and the band may fall off as soon as 48 hours after the banding (the band may occasionally be seen in the toilet bowl following a bowel movement). You may notice a temporary feeling of fullness in the rectum which should respond adequately to plain Tylenol  or Motrin .  Call back in 1 week if you would like to do additional banding.    [name], MSN, FNP-BC, AGACNP-BC [facility]

## 2023-06-02 ENCOUNTER — Encounter: Payer: Self-pay | Admitting: *Deleted

## 2023-06-19 ENCOUNTER — Ambulatory Visit (INDEPENDENT_AMBULATORY_CARE_PROVIDER_SITE_OTHER): Admitting: Gastroenterology

## 2023-06-19 ENCOUNTER — Encounter: Payer: Self-pay | Admitting: Gastroenterology

## 2023-06-19 VITALS — BP 114/77 | HR 91 | Temp 99.1°F | Ht 63.0 in | Wt 215.0 lb

## 2023-06-19 DIAGNOSIS — K641 Second degree hemorrhoids: Secondary | ICD-10-CM | POA: Diagnosis not present

## 2023-06-19 NOTE — Progress Notes (Signed)
   [facility] Banding Procedure Note:   Hannah Osborn is a 38 y.o. female presenting today for consideration of hemorrhoid banding. Last colonoscopy 2023 with small angiodysplastic lesion treated with APC therapy.  Latex Allergy: NO  Interval History: Spoke with patient on 5/15 regarding her bowel movement consistency.  Has been going much easier and having less pain since banding but at times still having [place] 1 stool sensation of feeling like she is having a larger bowel movement and will have some liquidy stools since Linzess .  We discussed the multiple factors could be causing this including diet, hydration, and hormones.  Advised that she could add MiraLAX  to her Linzess  if needed. Pain has been better and Bms are better  The patient presents with symptomatic grade 2 hemorrhoids, unresponsive to maximal medical therapy, requesting rubber band ligation of his/her hemorrhoidal disease. All risks, benefits, and alternative forms of therapy were described and informed consent was obtained.   Has tight anal sphincter tone - does well with pre medication with rectal lidocaine  before banding performed.   The decision was made to band the left lateral internal hemorrhoid, and the [facility] O'Regan System was used to perform band ligation without complication. Digital anorectal examination was then performed to assure proper positioning of the band, and to adjust the banded tissue as required. The patient was discharged home without pain or other issues. Dietary and behavioral recommendations were given along with follow-up instructions. The patient will return in 2-3 weeks for additional banding.   No complications were encountered and the patient tolerated the procedure well.   Given tight anal sphincter tone and constipation may need to consider rectal manometry vs flexible sigmoidoscopy given ongoing rectal bleeding and anemia with constipation issues.  Discussed this with patient today.   [name],  MSN, FNP-BC, AGACNP-BC [facility]

## 2023-06-19 NOTE — Patient Instructions (Signed)
 Continue to avoid straining. Limit toilet time to 2-3 minutes at the most.   Avoid constipation. Continue Linzess  daily with miralax  as needed.   Let me know if you want to consider pelvic floor physical therapy to help with constipation/straining.   Occasionally, you may have more bleeding than usual after the banding procedure. This is often from the untreated hemorrhoids rather than the treated one. Don't be concerned if there is a tablespoon or so of blood. If there is more blood than this, lie flat with your bottom higher than your head and apply an ice pack to the area. If the bleeding does not stop within a half an hour or if you feel faint, have severe pain, chills, fever or difficulty passing urine (very rare) or other problems, you should call us  at [phone] or report to the nearest emergency room. Please call me with any concerns!  The procedure you have had should have been relatively painless since the banding of the area involved does not have nerve endings and there is no pain sensation. The rubber band cuts off the blood supply to the hemorrhoid and the band may fall off as soon as 48 hours after the banding (the band may occasionally be seen in the toilet bowl following a bowel movement). You may notice a temporary feeling of fullness in the rectum which should respond adequately to plain Tylenol  or Motrin .  I will see you back in 2-3 weeks for additional banding.   [name], MSN, FNP-BC, AGACNP-BC [facility]

## 2023-07-07 ENCOUNTER — Encounter: Payer: Self-pay | Admitting: Adult Health

## 2023-07-07 ENCOUNTER — Ambulatory Visit: Admitting: Adult Health

## 2023-07-07 VITALS — BP 113/75 | HR 91 | Ht 63.0 in | Wt 213.0 lb

## 2023-07-07 DIAGNOSIS — N6342 Unspecified lump in left breast, subareolar: Secondary | ICD-10-CM

## 2023-07-07 DIAGNOSIS — N644 Mastodynia: Secondary | ICD-10-CM | POA: Diagnosis not present

## 2023-07-07 NOTE — Progress Notes (Signed)
  Subjective:     Patient ID: Hannah Osborn, female   DOB: Feb 04, 1985, 38 y.o.   MRN: [id number]  HPI Hannah Osborn  is a 38 year old black female,married, sp hysterectomy in complaining of pain in left breast for for about 3 weeks, esp behind nipple, it feels heavy and achy, and now right breast tingles.  PCP is Hannah Polanco NP  Review of Systems +pain in left breast for for about 3 weeks, esp behind nipple, it feels heavy and achy, and now right breast tingles. Denies any nipple discharge  Does not have but 2 cups soda per day   Reviewed past medical,surgical, social and family history. Reviewed medications and allergies.  Objective:   Physical Exam BP 113/75 (BP Location: Right Arm, Patient Position: Sitting, Cuff Size: Large)   Pulse 91   Ht 5' 3 (1.6 m)   Wt 213 lb (96.6 kg)   LMP 03/14/2022 (Approximate)   BMI 37.73 kg/m      Skin warm and dry,  Breasts:no dominate palpable mass, retraction or nipple discharge on the right, on the left, no retraction or nipple discharge, has tenderness and pea size mass/firmness at 7-8 o'clock in areola near nipple.     [facility] - 07/07/23 1608       Pregnancy Intention Screening   Does the patient want to become pregnant in the next year? N/A    Does the patient's partner want to become pregnant in the next year? N/A      Contraception Wrap Up   Current Method Female Sterilization   hyst   End Method Female Sterilization   hyst   Contraception Counseling Provided No    How was the end contraceptive method provided? N/A          Assessment:     1. Breast pain, left (Primary) Will get diagnostic bilateral mammogram and US  at [facility], will call for appointment  - US  LIMITED ULTRASOUND INCLUDING AXILLA RIGHT BREAST; Future - MM 3D DIAGNOSTIC MAMMOGRAM BILATERAL BREAST; Future - US  LIMITED ULTRASOUND INCLUDING AXILLA LEFT BREAST ; Future  2. Subareolar mass of left breast Will get diagnostic bilateral mammogram and US  at [facility], will call for  appointment  - US  LIMITED ULTRASOUND INCLUDING AXILLA RIGHT BREAST; Future - MM 3D DIAGNOSTIC MAMMOGRAM BILATERAL BREAST; Future - US  LIMITED ULTRASOUND INCLUDING AXILLA LEFT BREAST ; Future     Plan:     Follow up prn

## 2023-07-08 ENCOUNTER — Telehealth: Payer: Self-pay | Admitting: Adult Health

## 2023-07-08 NOTE — Telephone Encounter (Signed)
 Left message that diagnostic mammogram is scheduled for 07/31/23 at  10 am at [facility]

## 2023-07-15 ENCOUNTER — Ambulatory Visit (INDEPENDENT_AMBULATORY_CARE_PROVIDER_SITE_OTHER): Admitting: Gastroenterology

## 2023-07-15 ENCOUNTER — Encounter: Payer: Self-pay | Admitting: Gastroenterology

## 2023-07-15 VITALS — BP 114/80 | HR 83 | Temp 98.2°F | Ht 63.0 in | Wt 212.6 lb

## 2023-07-15 DIAGNOSIS — K625 Hemorrhage of anus and rectum: Secondary | ICD-10-CM

## 2023-07-15 DIAGNOSIS — K641 Second degree hemorrhoids: Secondary | ICD-10-CM | POA: Diagnosis not present

## 2023-07-15 DIAGNOSIS — D509 Iron deficiency anemia, unspecified: Secondary | ICD-10-CM

## 2023-07-15 NOTE — Progress Notes (Signed)
   [facility] Banding Procedure Note:   Hannah Osborn is a 38 y.o. female presenting today for consideration of hemorrhoid banding. Last colonoscopy 2023 with 3 small angiodysplastic lesions in descending and transverse colon treated with APC therapy..  Latex Allergy: NO  Interval History: Continues to take Linzess  daily and has miralax  if needed. Has had less pain and still has bleeding but is significantly less.  Last visit we discussed potential need for rectal manometry versus flexible sigmoidoscopy given her anemia with constipation and ongoing rectal bleeding with tight anal sphincter tone.   The patient presents with symptomatic grade 2 hemorrhoids, unresponsive to maximal medical therapy, requesting rubber band ligation of his/her hemorrhoidal disease. All risks, benefits, and alternative forms of therapy were described and informed consent was obtained.  Premedication with rectal lidocaine  applied with initial digital rectal exam.  The decision was made to band the right anterior internal hemorrhoid, and the [facility] O'Regan System was used to perform band ligation without complication. Digital anorectal examination was then performed to assure proper positioning of the band, and to adjust the banded tissue as required. The patient was discharged home without pain or other issues. Dietary and behavioral recommendations were given and (if necessary prescriptions were given), along with follow-up instructions. The patient will return in 5-6 weeks for followup and possible additional banding as required.  No complications were encountered and the patient tolerated the procedure well.   May need to consider repeat flexible sigmoidoscopy versus colonoscopy given history of angiodysplastic lesions as potential cause for rectal bleeding.  Charmaine Melia, MSN, FNP-BC, AGACNP-BC [facility]

## 2023-07-15 NOTE — Patient Instructions (Addendum)
 Continue to avoid straining. Limit toilet time to 2-3 minutes at the most.   Avoid constipation. Continue Linzess  290 mcg daily. If bleeding worsens after the 2 week healing from this banding then please let me know and we should do colonoscopy to re -evaluate for AVMs in your colon like you had previously. You may have some increased bleeding from procedure today into tomorrow given the stretching and trauma from the banding.   Occasionally, you may have more bleeding than usual after the banding procedure. This is often from the untreated hemorrhoids rather than the treated one. Don't be concerned if there is a tablespoon or so of blood. If there is more blood than this, lie flat with your bottom higher than your head and apply an ice pack to the area. If the bleeding does not stop within a half an hour or if you feel faint, have severe pain, chills, fever or difficulty passing urine (very rare) or other problems, you should call us  at [phone] or report to the nearest emergency room. Please call me with any concerns!  The procedure you have had should have been relatively painless since the banding of the area involved does not have nerve endings and there is no pain sensation. The rubber band cuts off the blood supply to the hemorrhoid and the band may fall off as soon as 48 hours after the banding (the band may occasionally be seen in the toilet bowl following a bowel movement). You may notice a temporary feeling of fullness in the rectum which should respond adequately to plain Tylenol  or Motrin .  I will see you back in 5-6 weeks for follow-up and/or for additional banding.   Charmaine Melia, MSN, FNP-BC, AGACNP-BC [facility]

## 2023-07-24 ENCOUNTER — Other Ambulatory Visit: Payer: Self-pay | Admitting: Medical Genetics

## 2023-07-28 ENCOUNTER — Ambulatory Visit: Admitting: Gastroenterology

## 2023-07-31 ENCOUNTER — Encounter (HOSPITAL_COMMUNITY): Payer: Self-pay

## 2023-07-31 ENCOUNTER — Ambulatory Visit (HOSPITAL_COMMUNITY)
Admission: RE | Admit: 2023-07-31 | Discharge: 2023-07-31 | Disposition: A | Discharge status: Home / Self Care | Source: Ambulatory Visit | Attending: Adult Health | Admitting: Adult Health

## 2023-07-31 ENCOUNTER — Other Ambulatory Visit (HOSPITAL_COMMUNITY)

## 2023-07-31 ENCOUNTER — Encounter (HOSPITAL_COMMUNITY)

## 2023-07-31 ENCOUNTER — Other Ambulatory Visit (HOSPITAL_COMMUNITY)
Admission: RE | Admit: 2023-07-31 | Discharge: 2023-07-31 | Disposition: A | Discharge status: Home / Self Care | Source: Ambulatory Visit | Attending: Oncology | Admitting: Oncology

## 2023-07-31 DIAGNOSIS — N6342 Unspecified lump in left breast, subareolar: Secondary | ICD-10-CM

## 2023-07-31 DIAGNOSIS — N644 Mastodynia: Secondary | ICD-10-CM | POA: Diagnosis present

## 2023-08-01 ENCOUNTER — Ambulatory Visit: Payer: Self-pay | Admitting: Adult Health

## 2023-08-11 LAB — GENECONNECT MOLECULAR SCREEN: Genetic Analysis Overall Interpretation: NEGATIVE

## 2023-08-26 ENCOUNTER — Other Ambulatory Visit: Payer: Self-pay | Admitting: *Deleted

## 2023-08-26 ENCOUNTER — Encounter: Payer: Self-pay | Admitting: Gastroenterology

## 2023-08-26 ENCOUNTER — Ambulatory Visit: Admitting: Gastroenterology

## 2023-08-26 VITALS — BP 114/77 | HR 85 | Temp 98.7°F | Ht 63.0 in | Wt 214.8 lb

## 2023-08-26 DIAGNOSIS — K641 Second degree hemorrhoids: Secondary | ICD-10-CM

## 2023-08-26 DIAGNOSIS — K6289 Other specified diseases of anus and rectum: Secondary | ICD-10-CM | POA: Diagnosis not present

## 2023-08-26 DIAGNOSIS — K5904 Chronic idiopathic constipation: Secondary | ICD-10-CM

## 2023-08-26 DIAGNOSIS — K5900 Constipation, unspecified: Secondary | ICD-10-CM

## 2023-08-26 DIAGNOSIS — K625 Hemorrhage of anus and rectum: Secondary | ICD-10-CM

## 2023-08-26 NOTE — Progress Notes (Signed)
 GI Office Note    Referring Provider: Del Orbe Polanco, Ilian* Primary Care Physician:  Terry Wilhelmena Lloyd Hilario, FNP Primary Gastroenterologist: Carlin POUR. Cindie, DO  Date:  08/26/2023  ID:  Hannah Osborn, DOB Feb 28, 1985, MRN [id number]   Chief Complaint   Chief Complaint  Patient presents with   Follow-up    Pt wants to have a banding if you have time   History of Present Illness  Hannah Osborn is a 38 y.o. female with a history of colonic angiodysplastic lesions, hemorrhoids s/p surgical hemorrhoidectomy in 2018, chronic constipation, and IDA presenting today for follow-up of hemorrhoids, constipation, and IDA.  Colonoscopy in 2023: - 3 small angiodysplastic lesions in the descending and transverse colon treated with APC therapy - Repeat colonoscopy in 10 years.  Last office visit 04/03/23 with Josette Centers, PA-C.  Seen for constipation.  Has underwent hemorrhoid banding x 3.  During her last hemorrhoid banding appointment she noted to be taking Linzess  daily and MiraLAX  if needed.  Is having less pain but still having some bleeding although it is significantly less than prior.  We had previously discussed potential for flexible sigmoidoscopy versus rectal manometry given her anemia with constipation and ongoing rectal bleeding.  She has history of angiodysplastic lesions within the colon causing rectal bleeding.  Today:  Discussed the use of AI scribe software for clinical note transcription with the patient, who gave verbal consent to proceed.    Wt Readings from Last 5 Encounters:  08/26/23 214 lb 12.8 oz (97.4 kg)  07/15/23 212 lb 9.6 oz (96.4 kg)  07/07/23 213 lb (96.6 kg)  06/19/23 215 lb (97.5 kg)  05/29/23 214 lb 3.2 oz (97.2 kg)    Current Outpatient Medications  Medication Sig Dispense Refill   albuterol  (VENTOLIN  HFA) 108 (90 Base) MCG/ACT inhaler Inhale 1-2 puffs into the lungs every 6 (six) hours as needed for wheezing or shortness of breath. 8 g 3    DULoxetine  (CYMBALTA ) 30 MG capsule Take 1 capsule (30 mg total) by mouth daily. 30 capsule 3   ibuprofen  (ADVIL ) 800 MG tablet Take 800 mg by mouth every 8 (eight) hours as needed.     linaclotide  (LINZESS ) 290 MCG CAPS capsule Take 1 capsule (290 mcg total) by mouth daily before breakfast. 30 capsule 3   methocarbamol  (ROBAXIN ) 500 MG tablet Take 1 tablet (500 mg total) by mouth 2 (two) times daily. 20 tablet 0   pantoprazole  (PROTONIX ) 40 MG tablet Take 1 tablet (40 mg total) by mouth daily before breakfast. 30 tablet 5   polyethylene glycol powder (GLYCOLAX /MIRALAX ) 17 GM/SCOOP powder 1 scoop daily or as needed 255 g 11   rosuvastatin  (CRESTOR ) 10 MG tablet Take 1 tablet (10 mg total) by mouth daily. 90 tablet 3   SSD 1 % cream Apply 1 Application topically 2 (two) times daily. (Patient not taking: Reported on 08/26/2023)     No current facility-administered medications for this visit.    Past Medical History:  Diagnosis Date   Allergy    Anemia    Anxiety    Anxiety and depression 03/21/2020   COVID-19    Fibroid 01/08/2016   Hypothyroid 04/03/2017   Hypothyroidism    Screen for STD (sexually transmitted disease) 10/26/2020   Thyroid  disease     Past Surgical History:  Procedure Laterality Date   BIOPSY  11/29/2021   Procedure: BIOPSY;  Surgeon: Cindie Carlin POUR, DO;  Location: [facility];  Service: Endoscopy;;  CHOLECYSTECTOMY     COLONOSCOPY WITH PROPOFOL  N/A 11/29/2021   Procedure: COLONOSCOPY WITH PROPOFOL ;  Surgeon: Cindie Carlin POUR, DO;  Location: [facility];  Service: Endoscopy;  Laterality: N/A;  2:15 pm, pt knows to arrive at 10:15   DILITATION & CURRETTAGE/HYSTROSCOPY WITH NOVASURE ABLATION N/A 02/02/2016   Procedure: DILATATION & CURETTAGE/HYSTEROSCOPY WITH NOVASURE ABLATION (procedure 2);  Surgeon: Vonn VEAR Inch, MD;  Location: [facility];  Service: Gynecology;  Laterality: N/A;   ESOPHAGOGASTRODUODENOSCOPY (EGD) WITH PROPOFOL  N/A 11/29/2021   Procedure:  ESOPHAGOGASTRODUODENOSCOPY (EGD) WITH PROPOFOL ;  Surgeon: Cindie Carlin POUR, DO;  Location: [facility];  Service: Endoscopy;  Laterality: N/A;   HEMORRHOID SURGERY N/A 05/01/2016   Procedure: EXTENSIVE HEMORRHOIDECTOMY;  Surgeon: Oneil Budge, MD;  Location: [facility];  Service: General;  Laterality: N/A;   LAPAROSCOPIC BILATERAL SALPINGECTOMY Bilateral 02/02/2016   Procedure: LAPAROSCOPIC BILATERAL SALPINGECTOMY (procedure #1);  Surgeon: Vonn VEAR Inch, MD;  Location: [facility];  Service: Gynecology;  Laterality: Bilateral;   ROBOTIC ASSISTED LAPAROSCOPIC HYSTERECTOMY AND SALPINGECTOMY N/A 07/03/2022   Procedure: XI ROBOTIC ASSISTED LAPAROSCOPIC HYSTERECTOMY, TAP BLOCK;  Surgeon: Inch Vonn VEAR, MD;  Location: [facility];  Service: Gynecology;  Laterality: N/A;   TONSILLECTOMY     TUBAL LIGATION      Family History  Problem Relation Age of Onset   Pulmonary embolism Mother    Early death Mother    Cancer Father        stomach   Early death Father    Asthma Son    Allergies Son    Heart disease Maternal Uncle    Cancer Maternal Grandmother        uterine   Cancer Maternal Grandfather        colon    Allergies as of 08/26/2023   (No Known Allergies)    Social History   Socioeconomic History   Marital status: Single    Spouse name: Not on file   Number of children: 1   Years of education: 12   Highest education level: Not on file  Occupational History   Occupation: dietary aid    Employer: [employer]  Tobacco Use   Smoking status: Never   Smokeless tobacco: Never  Vaping Use   Vaping status: Never Used  Substance and Sexual Activity   Alcohol use: Yes    Comment: occasional   Drug use: No   Sexual activity: Yes    Partners: Male    Birth control/protection: Surgical    Comment: hyst  Other Topics Concern   Not on file  Social History Narrative   Lives with son Valery      Works in Surveyor, mining at [employer]   Social Drivers of Health   Financial  Resource Strain: Low Risk  (03/21/2020)   Overall Financial Resource Strain (CARDIA)    Difficulty of Paying Living Expenses: Not hard at all  Food Insecurity: No Food Insecurity (03/21/2020)   Hunger Vital Sign    Worried About Running Out of Food in the Last Year: Never true    Ran Out of Food in the Last Year: Never true  Transportation Needs: No Transportation Needs (03/21/2020)   PRAPARE - Administrator, Civil Service (Medical): No    Lack of Transportation (Non-Medical): No  Physical Activity: Sufficiently Active (03/21/2020)   Exercise Vital Sign    Days of Exercise per Week: 6 days    Minutes of Exercise per Session: 100 min  Stress:  Stress Concern Present (03/21/2020)   [employer] of Occupational Health - Occupational Stress Questionnaire    Feeling of Stress : To some extent  Social Connections: Socially Isolated (03/21/2020)   Social Connection and Isolation Panel    Frequency of Communication with Friends and Family: More than three times a week    Frequency of Social Gatherings with Friends and Family: Never    Attends Religious Services: Never    Database administrator or Organizations: No    Attends Banker Meetings: Never    Marital Status: Never married     Review of Systems   Gen: Denies fever, chills, anorexia. Denies fatigue, weakness, weight loss.  CV: Denies chest pain, palpitations, syncope, peripheral edema, and claudication. Resp: Denies dyspnea at rest, cough, wheezing, coughing up blood, and pleurisy. GI: See HPI Derm: Denies rash, itching, dry skin Psych: Denies depression, anxiety, memory loss, confusion. No homicidal or suicidal ideation.  Heme: Denies bruising, bleeding, and enlarged lymph nodes.  Physical Exam   BP 114/77   Pulse 85   Temp 98.7 F (37.1 C)   Ht 5' 3 (1.6 m)   Wt 214 lb 12.8 oz (97.4 kg)   LMP 03/14/2022 (Approximate)   BMI 38.05 kg/m   General:   Alert and oriented. No distress noted. Pleasant and  cooperative.  Head:  Normocephalic and atraumatic. Eyes:  Conjuctiva clear without scleral icterus. Mouth:  Oral mucosa pink and moist. Good dentition. No lesions. Lungs:  Clear to auscultation bilaterally. No wheezes, rales, or rhonchi. No distress.  Heart:  S1, S2 present without murmurs appreciated.  Abdomen:  +BS, soft, non-tender and non-distended. No rebound or guarding. No HSM or masses noted. Msk:  Symmetrical without gross deformities. Normal posture. Extremities:  Without edema. Neurologic:  Alert and  oriented x4 Psych:  Alert and cooperative. Normal mood and affect.  [facility] Banding Procedure Note:   The patient presents with symptomatic grade 2 hemorrhoids, unresponsive to maximal medical therapy, requesting rubber band ligation of his/her hemorrhoidal disease. All risks, benefits, and alternative forms of therapy were described and informed consent was obtained.  The decision was made to band in the neutral position and the [facility] O'Regan System was used to perform band ligation without complication. Digital anorectal examination was then performed to assure proper positioning of the band, and to adjust the banded tissue as required. Band was noted in the right anterior position. The patient was discharged home without pain or other issues. Dietary and behavioral recommendations were given and (if necessary prescriptions were given), along with follow-up instructions. The patient will return for follow up as needed.   No complications were encountered and the patient tolerated the procedure well.    Assessment  Ameshia Pewitt Lento is a 38 y.o. female presenting today with complaint of potential obstruction causing further issues with constipation and potential ongoing hemorrhoid.    Constipation with difficult evacuation and anal sphincter hypertonicity Ongoing constipation with sensation of incomplete evacuation despite soft stools. Anal sphincter hypertonicity may contribute to  symptoms. Potential pelvic floor dysfunction discussed as a contributing factor.  Continues on Linzess  290 mcg daily.  Rectal exam without evidence of rectal mass but does have tight internal and external anal sphincter tone.  Evidence of prior external hemorrhoids noted. - Given referral for anorectal manometry given anal sphincter hypertonicity and ongoing constipation. - Discussed potential for Botox injection if anorectal manometry indicates hypercontractile sphincter, will discuss further with physician pending manometry results. - Consider referral  for pelvic floor physical therapy and biofeedback if indicated. - Continue Linzess  290 mcg daily.  Hemorrhoids, status post banding, with residual symptoms Post-banding sensation of resistance and incomplete evacuation began approximately four days after last banding. No significant bleeding reported. Possible residual tissue or external hemorrhoids contributing to symptoms. - Neutral banding performed as noted above.  Rectal bleeding, evaluation for possible angiodysplasia Intermittent rectal bleeding with previous colonoscopy in 2023 identifying angiodysplastic lesions. Need to determine if lesions contribute to current symptoms. - Consider full colonoscopy for further evaluation of ongoing rectal bleeding despite hemorrhoid treatment.     PLAN   Recall OV in 6 months Progress report in 2-3 weeks, if still having sensation of tissue present obstructing defecation along with rectal bleeding then will order colonoscopy for rectal exam under sedation and surveillance of angiodysplastic lesions.    Charmaine Melia, MSN, FNP-BC, AGACNP-BC [facility]

## 2023-08-26 NOTE — Patient Instructions (Addendum)
 Please call me with a progress report in 2-3 weeks to let me know if you are still having that sensation of feeling tissue or something as they are blocking your ability to have a bowel movement.  Continue Linzess  290 mcg daily.  Please also let me know if you are continuing to have a significant amount of rectal bleeding or if it is worsening from prior.  We are going to go ahead and send referral for anorectal manometry for you to further assess how well your anal sphincter muscle is relaxing contracting along with your pelvic floor muscles to see if this is the cause of constipation (straining) and difficulty having a bowel movement.  It was a pleasure to see you today. I want to create trusting relationships with patients. If you receive a survey regarding your visit,  I greatly appreciate you taking time to fill this out on paper or through your MyChart. I value your feedback.  Charmaine Melia, MSN, FNP-BC, AGACNP-BC [facility]

## 2023-09-01 ENCOUNTER — Inpatient Hospital Stay: Attending: Physician Assistant

## 2023-09-01 DIAGNOSIS — D509 Iron deficiency anemia, unspecified: Secondary | ICD-10-CM | POA: Insufficient documentation

## 2023-09-01 DIAGNOSIS — D500 Iron deficiency anemia secondary to blood loss (chronic): Secondary | ICD-10-CM

## 2023-09-01 LAB — CBC WITH DIFFERENTIAL/PLATELET
Abs Immature Granulocytes: 0.01 K/uL (ref 0.00–0.07)
Basophils Absolute: 0 K/uL (ref 0.0–0.1)
Basophils Relative: 1 %
Eosinophils Absolute: 0 K/uL (ref 0.0–0.5)
Eosinophils Relative: 1 %
HCT: 35.6 % — ABNORMAL LOW (ref 36.0–46.0)
Hemoglobin: 11.4 g/dL — ABNORMAL LOW (ref 12.0–15.0)
Immature Granulocytes: 0 %
Lymphocytes Relative: 30 %
Lymphs Abs: 1.6 K/uL (ref 0.7–4.0)
MCH: 26.5 pg (ref 26.0–34.0)
MCHC: 32 g/dL (ref 30.0–36.0)
MCV: 82.6 fL (ref 80.0–100.0)
Monocytes Absolute: 0.4 K/uL (ref 0.1–1.0)
Monocytes Relative: 7 %
Neutro Abs: 3.3 K/uL (ref 1.7–7.7)
Neutrophils Relative %: 61 %
Platelets: 253 K/uL (ref 150–400)
RBC: 4.31 MIL/uL (ref 3.87–5.11)
RDW: 13.8 % (ref 11.5–15.5)
WBC: 5.4 K/uL (ref 4.0–10.5)
nRBC: 0 % (ref 0.0–0.2)

## 2023-09-01 LAB — IRON AND TIBC
Iron: 60 ug/dL (ref 28–170)
Saturation Ratios: 19 % (ref 10.4–31.8)
TIBC: 318 ug/dL (ref 250–450)
UIBC: 258 ug/dL

## 2023-09-01 LAB — FERRITIN: Ferritin: 138 ng/mL (ref 11–307)

## 2023-09-04 ENCOUNTER — Encounter: Payer: Self-pay | Admitting: Physician Assistant

## 2023-09-04 NOTE — Telephone Encounter (Signed)
 She has a telephone visit on Tuesday.  I let her know you could discuss then.

## 2023-09-08 ENCOUNTER — Telehealth: Admitting: Physician Assistant

## 2023-09-09 ENCOUNTER — Inpatient Hospital Stay: Admitting: Oncology

## 2023-09-09 DIAGNOSIS — D500 Iron deficiency anemia secondary to blood loss (chronic): Secondary | ICD-10-CM | POA: Diagnosis not present

## 2023-09-09 DIAGNOSIS — E538 Deficiency of other specified B group vitamins: Secondary | ICD-10-CM

## 2023-09-09 DIAGNOSIS — M25559 Pain in unspecified hip: Secondary | ICD-10-CM

## 2023-09-09 NOTE — Assessment & Plan Note (Signed)
-   No prior blood transfusion. - History of menorrhagia due to uterine fibroids, s/p hysterectomy in June 2024 - Patient chart lists that patient's mother had history of sickle cell trait, but patient denies any knowledge of this. - Rectal bleeding when straining for bowel movements, following with GI [facility], PA-C) for constipation/hematochezia  - Cannot take oral iron therapy secondary to constipation. - Most recent IV Feraheme  x 2 in December 2024 to January 2025 - Positive for fatigue - Most recent labs (04/16/2023): Hgb 11.8/MCV 83.9, ferritin 160, iron saturation 17% - PLAN: We discussed possible utility of IV iron in the setting of fatigue with Hgb <12.0 and iron saturation <20%, although ferritin is at goal (>100).  Patient will hold off on IV iron at this time.  Repeat labs with PHONE visit in 4 months.   (If persistent anemia and microcytosis despite continued adequate iron, would consider testing for hemoglobinopathy.)

## 2023-09-09 NOTE — Assessment & Plan Note (Signed)
-   No prior blood transfusion. - History of menorrhagia due to uterine fibroids, s/p hysterectomy in June 2024 - Patient chart lists that patient's mother had history of sickle cell trait, but patient denies any knowledge of this. - Rectal bleeding when straining for bowel movements, following with GI [facility], PA-C) for constipation/hematochezia  - Cannot take oral iron therapy secondary to constipation. - Most recent IV Feraheme  x 2 in December 2024 to January 2025 - Positive for fatigue and new onset joint pain. - Most recent labs 09/01/2023 showed a ferritin of 138, iron saturation 19% with a normal TIBC.  Hemoglobin is 11.4. -She is not interested in any additional IV iron at this time. -She would like to continue Flintstone vitamins with iron as she is tolerating well. -Assistant anemia and microcytosis despite continued adequate iron, would consider testing for hemoglobinopathy. -Return to clinic in 4 months with labs a few days before.

## 2023-09-09 NOTE — Progress Notes (Signed)
 [facility] OFFICE PROGRESS NOTE  Del Wilhelmena Falter, Hilario, FNP  ASSESSMENT & PLAN:   I connected with Hannah Osborn on 09/09/23 at 10:45 AM EDT by telephone visit and verified that I am speaking with the correct person using two identifiers.   I discussed the limitations, risks, security and privacy concerns of performing an evaluation and management service by telemedicine and the availability of in-person appointments. I also discussed with the patient that there may be a patient responsible charge related to this service. The patient expressed understanding and agreed to proceed.   Other persons participating in the visit and their role in the encounter: NP, Patient    Patient's location: Home Provider's location: Clinic    Assessment & Plan Iron deficiency anemia due to chronic blood loss - No prior blood transfusion. - History of menorrhagia due to uterine fibroids, s/p hysterectomy in June 2024 - Patient chart lists that patient's mother had history of sickle cell trait, but patient denies any knowledge of this. - Rectal bleeding when straining for bowel movements, following with GI [facility], PA-C) for constipation/hematochezia  - Cannot take oral iron therapy secondary to constipation. - Most recent IV Feraheme  x 2 in December 2024 to January 2025 - Positive for fatigue and new onset joint pain. - Most recent labs 09/01/2023 showed a ferritin of 138, iron saturation 19% with a normal TIBC.  Hemoglobin is 11.4. -She is not interested in any additional IV iron at this time. -She would like to continue Flintstone vitamins with iron as she is tolerating well. -Assistant anemia and microcytosis despite continued adequate iron, would consider testing for hemoglobinopathy. -Return to clinic in 4 months with labs a few days before. Pain in joint involving pelvic region and thigh, unspecified laterality -Patient would like to be tested for autoimmune disorders  such as lupus and rheumatoid arthritis. -She understands that if results are positive and she is experiencing symptoms, she would need to be referred to rheumatology. -Will add these labs onto her next visit in 4 months. Vitamin B12 deficiency disease -Evidence of numbness and tingling in her lower extremities.  Will add on vitamin B12 and MMA levels at her next visit.  Orders Placed This Encounter  Procedures   Ferritin    Standing Status:   Future    Expected Date:   01/09/2024    Expiration Date:   04/08/2024    Release to patient:   Immediate   Iron and TIBC    Standing Status:   Future    Expected Date:   01/09/2024    Expiration Date:   04/08/2024    Release to patient:   Immediate   CBC with Differential/Platelet    Standing Status:   Future    Expected Date:   01/09/2024    Expiration Date:   04/08/2024    Release to patient:   Immediate   Methylmalonic acid, serum    Standing Status:   Future    Expected Date:   01/09/2024    Expiration Date:   04/08/2024   Folate    Standing Status:   Future    Expected Date:   01/09/2024    Expiration Date:   04/08/2024   ANA, IFA (with reflex)    Standing Status:   Future    Expected Date:   01/09/2024    Expiration Date:   04/08/2024   Rheumatoid factor    Standing Status:   Future  Expected Date:   01/09/2024    Expiration Date:   04/08/2024   Lactate dehydrogenase    Standing Status:   Future    Expected Date:   01/09/2024    Expiration Date:   04/08/2024   Vitamin B12    Standing Status:   Future    Expected Date:   01/09/2024    Expiration Date:   09/08/2024    INTERVAL HISTORY: Patient returns for recurrent anemia.  Patient reports chronic stable fatigue since her last visit.  She last received IV iron in December/January 2025.  She has been taking Flintstone vitamins with iron 1/day for the last couple of months and tolerating well.  Has occasional constipation but overall doing okay.  Reports she has developed some lower  extremity joint pain especially at bedtime but mainly in her legs.  She is unsure if this is related to her iron deficiency/anemia or if this is something like autoimmune disorder.  She has shortness of breath which is stable and chronic.  Appetite is 100%.  She is eating iron rich foods including spinach.  Feels like she is hungry all the time.  Reports she has episodes of extreme fatigue where she will be in bed for several days.  The last episode was approximately a week ago when she had her labs drawn.  We reviewed labs from 09/01/2023.  SUMMARY OF HEMATOLOGIC HISTORY:   Lab Results  Component Value Date   HGB 11.4 (L) 09/01/2023   FERRITIN 138 09/01/2023   VITAMINB12 468 08/23/2022    There were no vitals filed for this visit.  Review of Systems  Constitutional:  Positive for malaise/fatigue.  Respiratory:  Positive for shortness of breath.   Gastrointestinal:  Positive for constipation.  Musculoskeletal:  Positive for back pain and joint pain.  Neurological:  Positive for dizziness and headaches.    Physical Exam Neurological:     Mental Status: She is alert and oriented to person, place, and time.    I provided 17 minutes of non face-to-face telephone visit time during this encounter, and > 50% was spent counseling as documented under my assessment & plan. Delon Hope, NP  09/09/2023 11:17 AM

## 2023-09-19 ENCOUNTER — Encounter: Payer: Self-pay | Admitting: Emergency Medicine

## 2023-09-19 ENCOUNTER — Ambulatory Visit
Admission: EM | Admit: 2023-09-19 | Discharge: 2023-09-19 | Disposition: A | Discharge status: Home / Self Care | Attending: Nurse Practitioner | Admitting: Nurse Practitioner

## 2023-09-19 ENCOUNTER — Other Ambulatory Visit: Payer: Self-pay

## 2023-09-19 DIAGNOSIS — J069 Acute upper respiratory infection, unspecified: Secondary | ICD-10-CM

## 2023-09-19 LAB — POC COVID19/FLU A&B COMBO
Covid Antigen, POC: NEGATIVE
Influenza A Antigen, POC: NEGATIVE
Influenza B Antigen, POC: NEGATIVE

## 2023-09-19 MED ORDER — PSEUDOEPH-BROMPHEN-DM 30-2-10 MG/5ML PO SYRP: 5.0000 mL | ORAL_SOLUTION | Freq: Four times a day (QID) | ORAL | 0 refills | Status: DC | PRN

## 2023-09-19 MED ORDER — PSEUDOEPH-BROMPHEN-DM 30-2-10 MG/5ML PO SYRP: 5.0000 mL | ORAL_SOLUTION | Freq: Four times a day (QID) | ORAL | 0 refills | Status: AC | PRN

## 2023-09-19 MED ORDER — FLUTICASONE PROPIONATE 50 MCG/ACT NA SUSP: 2.0000 | Freq: Every day | NASAL | 0 refills | Status: DC

## 2023-09-19 MED ORDER — FLUTICASONE PROPIONATE 50 MCG/ACT NA SUSP: 2.0000 | Freq: Every day | NASAL | 0 refills | Status: AC

## 2023-09-19 NOTE — ED Triage Notes (Signed)
 Pt reports nasal congestion, drainage, fatigue since yesterday. Denies any known fevers, body aches, v/d.

## 2023-09-19 NOTE — Discharge Instructions (Addendum)
 The COVID/flu test was negative. Take medication as prescribed. Recommend taking your allergy medication daily while symptoms persist. Increase fluids and allow for plenty of rest. You may take over-the-counter Tylenol  or ibuprofen  as needed for pain, fever, or general discomfort. Recommend the use of normal saline nasal spray for nasal congestion and runny nose. For your cough, recommend use of a humidifier in your bedroom at nighttime during sleep and sleeping elevated on pillows while symptoms persist. Symptoms should improve over the next 5 to 7 days.  If symptoms fail to improve, or appear to worsen, you may follow-up in this clinic or with your primary care physician for further evaluation. Follow-up as needed.

## 2023-09-19 NOTE — ED Provider Notes (Signed)
 [facility]    CSN: [id number] Arrival date & time: 09/19/23  1455      History   Chief Complaint Chief Complaint  Patient presents with   Nasal Congestion    HPI Hannah Osborn is a 38 y.o. female.   The history is provided by the patient.   Patient presents with a 1 day history of nasal congestion, postnasal drainage, fatigue, and cough.  Patient denies fever, chills, body aches, headache, ear pain, sore throat, wheezing, difficulty breathing, chest pain, abdominal pain, nausea, vomiting, diarrhea, or rash.  Patient is requesting COVID testing.  She endorses close sick contacts.  So far she has not taken any medications for her symptoms. Past Medical History:  Diagnosis Date   Allergy    Anemia    Anxiety    Anxiety and depression 03/21/2020   COVID-19    Fibroid 01/08/2016   Hypothyroid 04/03/2017   Hypothyroidism    Screen for STD (sexually transmitted disease) 10/26/2020   Thyroid  disease     Patient Active Problem List   Diagnosis Date Noted   Subareolar mass of left breast 07/07/2023   Breast pain, left 07/07/2023   Hematuria 04/28/2023   Abdominal pressure 04/28/2023   Left elbow tendonitis 11/15/2022   Chronic pain 09/19/2022   IDA (iron deficiency anemia) 09/17/2022   Fibroids 07/06/2022   Bloating 03/29/2021   Pelvic pain 02/15/2021   Screen for STD (sexually transmitted disease) 10/26/2020   Rectocele 03/21/2020   Anxiety 03/21/2020   Encounter for gynecological examination with Papanicolaou smear of cervix 03/21/2020   Hemorrhoids 03/21/2020   Dysmenorrhea 03/21/2020   Screening examination for STD (sexually transmitted disease) 07/22/2018   Encounter for well woman exam with routine gynecological exam 07/22/2018   Hypothyroidism 07/22/2018   Hypothyroid 04/03/2017   Internal and external bleeding hemorrhoids    Diarrhea, functional 03/06/2016   Rectal bleeding 03/06/2016   Fibroid 01/08/2016   Thyroid  disorder 12/18/2015    Interstitial cystitis 12/18/2015   Anemia 12/18/2015   Functional ovarian cysts 12/18/2015   Menorrhagia 12/18/2015   Class 1 obesity due to excess calories with body mass index (BMI) of 34.0 to 34.9 in adult 12/18/2015   Environmental allergies 12/18/2015    Past Surgical History:  Procedure Laterality Date   BIOPSY  11/29/2021   Procedure: BIOPSY;  Surgeon: Cindie Carlin POUR, DO;  Location: [facility];  Service: Endoscopy;;   CHOLECYSTECTOMY     COLONOSCOPY WITH PROPOFOL  N/A 11/29/2021   Procedure: COLONOSCOPY WITH PROPOFOL ;  Surgeon: Cindie Carlin POUR, DO;  Location: [facility];  Service: Endoscopy;  Laterality: N/A;  2:15 pm, pt knows to arrive at 10:15   DILITATION & CURRETTAGE/HYSTROSCOPY WITH NOVASURE ABLATION N/A 02/02/2016   Procedure: DILATATION & CURETTAGE/HYSTEROSCOPY WITH NOVASURE ABLATION (procedure 2);  Surgeon: Vonn VEAR Inch, MD;  Location: [facility];  Service: Gynecology;  Laterality: N/A;   ESOPHAGOGASTRODUODENOSCOPY (EGD) WITH PROPOFOL  N/A 11/29/2021   Procedure: ESOPHAGOGASTRODUODENOSCOPY (EGD) WITH PROPOFOL ;  Surgeon: Cindie Carlin POUR, DO;  Location: [facility];  Service: Endoscopy;  Laterality: N/A;   HEMORRHOID SURGERY N/A 05/01/2016   Procedure: EXTENSIVE HEMORRHOIDECTOMY;  Surgeon: Oneil Budge, MD;  Location: [facility];  Service: General;  Laterality: N/A;   LAPAROSCOPIC BILATERAL SALPINGECTOMY Bilateral 02/02/2016   Procedure: LAPAROSCOPIC BILATERAL SALPINGECTOMY (procedure #1);  Surgeon: Vonn VEAR Inch, MD;  Location: [facility];  Service: Gynecology;  Laterality: Bilateral;   ROBOTIC ASSISTED LAPAROSCOPIC HYSTERECTOMY AND SALPINGECTOMY N/A 07/03/2022   Procedure: XI ROBOTIC ASSISTED  LAPAROSCOPIC HYSTERECTOMY, TAP BLOCK;  Surgeon: Jayne Vonn DEL, MD;  Location: [facility];  Service: Gynecology;  Laterality: N/A;   TONSILLECTOMY     TUBAL LIGATION      OB History     Gravida  1   Para  1   Term  1   Preterm      AB      Living  1      SAB      IAB       Ectopic      Multiple      Live Births  1            Home Medications    Prior to Admission medications   Medication Sig Start Date End Date Taking? Authorizing Provider  brompheniramine-pseudoephedrine-DM 30-2-10 MG/5ML syrup Take 5 mLs by mouth 4 (four) times daily as needed. 09/19/23  Yes Leath-Warren, Etta PARAS, NP  brompheniramine-pseudoephedrine-DM 30-2-10 MG/5ML syrup Take 5 mLs by mouth 4 (four) times daily as needed. 09/19/23  Yes Leath-Warren, Etta PARAS, NP  fluticasone  (FLONASE ) 50 MCG/ACT nasal spray Place 2 sprays into both nostrils daily. 09/19/23  Yes Leath-Warren, Etta PARAS, NP  fluticasone  (FLONASE ) 50 MCG/ACT nasal spray Place 2 sprays into both nostrils daily. 09/19/23  Yes Leath-Warren, Etta PARAS, NP  albuterol  (VENTOLIN  HFA) 108 (90 Base) MCG/ACT inhaler Inhale 1-2 puffs into the lungs every 6 (six) hours as needed for wheezing or shortness of breath. 08/22/22   Del Wilhelmena Lloyd Sola, FNP  DULoxetine  (CYMBALTA ) 30 MG capsule Take 1 capsule (30 mg total) by mouth daily. 09/19/22   Del Wilhelmena Lloyd Sola, FNP  ibuprofen  (ADVIL ) 800 MG tablet Take 800 mg by mouth every 8 (eight) hours as needed. 09/19/22   [provider]  linaclotide  (LINZESS ) 290 MCG CAPS capsule Take 1 capsule (290 mcg total) by mouth daily before breakfast. 04/03/23   Rudy Josette RAMAN, PA-C  methocarbamol  (ROBAXIN ) 500 MG tablet Take 1 tablet (500 mg total) by mouth 2 (two) times daily. 06/24/22   Leath-Warren, Etta PARAS, NP  pantoprazole  (PROTONIX ) 40 MG tablet Take 1 tablet (40 mg total) by mouth daily before breakfast. 04/03/23   Rudy Josette RAMAN, PA-C  polyethylene glycol powder (GLYCOLAX /MIRALAX ) 17 GM/SCOOP powder 1 scoop daily or as needed 04/02/22   Jayne Vonn DEL, MD  rosuvastatin  (CRESTOR ) 10 MG tablet Take 1 tablet (10 mg total) by mouth daily. 08/25/22   Del Wilhelmena Lloyd Sola, FNP  SSD 1 % cream Apply 1 Application topically 2 (two) times daily. Patient not taking: Reported on  09/09/2023 05/24/23   [provider]    Family History Family History  Problem Relation Age of Onset   Pulmonary embolism Mother    Early death Mother    Cancer Father        stomach   Early death Father    Asthma Son    Allergies Son    Heart disease Maternal Uncle    Cancer Maternal Grandmother        uterine   Cancer Maternal Grandfather        colon    Social History Social History   Tobacco Use   Smoking status: Never   Smokeless tobacco: Never  Vaping Use   Vaping status: Never Used  Substance Use Topics   Alcohol use: Yes    Comment: occasional   Drug use: No     Allergies   Patient has no known allergies.   Review of Systems  Review of Systems Per HPI  Physical Exam Triage Vital Signs ED Triage Vitals  Encounter Vitals Group     BP 09/19/23 1632 123/81     Girls Systolic BP Percentile --      Girls Diastolic BP Percentile --      Boys Systolic BP Percentile --      Boys Diastolic BP Percentile --      Pulse Rate 09/19/23 1632 76     Resp 09/19/23 1632 20     Temp 09/19/23 1632 98.5 F (36.9 C)     Temp Source 09/19/23 1632 Oral     SpO2 09/19/23 1632 96 %     Weight --      Height --      Head Circumference --      Peak Flow --      Pain Score 09/19/23 1631 0     Pain Loc --      Pain Education --      Exclude from Growth Chart --    No data found.  Updated Vital Signs BP 123/81 (BP Location: Right Arm)   Pulse 76   Temp 98.5 F (36.9 C) (Oral)   Resp 20   LMP 03/14/2022 (Approximate)   SpO2 96%   Visual Acuity Right Eye Distance:   Left Eye Distance:   Bilateral Distance:    Right Eye Near:   Left Eye Near:    Bilateral Near:     Physical Exam Vitals and nursing note reviewed.  Constitutional:      General: She is not in acute distress.    Appearance: Normal appearance.  HENT:     Head: Normocephalic.     Right Ear: Tympanic membrane, ear canal and external ear normal.     Left Ear: Tympanic membrane, ear  canal and external ear normal.     Nose: Congestion present.     Right Turbinates: Enlarged and swollen.     Left Turbinates: Enlarged and swollen.     Right Sinus: No maxillary sinus tenderness or frontal sinus tenderness.     Left Sinus: No maxillary sinus tenderness or frontal sinus tenderness.     Mouth/Throat:     Lips: Pink.     Mouth: Mucous membranes are moist.     Pharynx: Postnasal drip present. No pharyngeal swelling, oropharyngeal exudate, posterior oropharyngeal erythema or uvula swelling.     Comments: Cobblestoning present to posterior oropharynx  Eyes:     Conjunctiva/sclera: Conjunctivae normal.     Pupils: Pupils are equal, round, and reactive to light.  Cardiovascular:     Rate and Rhythm: Normal rate and regular rhythm.     Pulses: Normal pulses.     Heart sounds: Normal heart sounds.  Pulmonary:     Effort: Pulmonary effort is normal. No respiratory distress.     Breath sounds: Normal breath sounds. No stridor. No wheezing, rhonchi or rales.  Abdominal:     General: Bowel sounds are normal.     Tenderness: There is no abdominal tenderness.  Musculoskeletal:     Cervical back: Normal range of motion.  Skin:    General: Skin is warm and dry.  Neurological:     General: No focal deficit present.     Mental Status: She is alert and oriented to person, place, and time.  Psychiatric:        Mood and Affect: Mood normal.        Behavior: Behavior normal.  UC Treatments / Results  Labs (all labs ordered are listed, but only abnormal results are displayed) Labs Reviewed  POC COVID19/FLU A&B COMBO    EKG   Radiology No results found.  Procedures Procedures (including critical care time)  Medications Ordered in UC Medications - No data to display  Initial Impression / Assessment and Plan / UC Course  I have reviewed the triage vital signs and the nursing notes.  Pertinent labs & imaging results that were available during my care of the patient  were reviewed by me and considered in my medical decision making (see chart for details).  COVID/flu test is negative.  On exam, the patient is well-appearing, she is in no acute distress, vital signs are stable.  Symptoms are consistent with a viral URI with cough.  Symptomatic treatment provided with fluticasone  50 mcg nasal spray, and Bromfed-DM for the cough.  Supportive care recommendations were provided and discussed with the patient to include fluids, rest, over-the-counter analgesics, normal saline nasal spray, and use of a humidifier during sleep.  Discussed indications with patient regarding follow-up.  Patient was in agreement with this plan of care and verbalizes understanding.  All questions were answered.  Patient stable for discharge.  Final Clinical Impressions(s) / UC Diagnoses   Final diagnoses:  Viral URI with cough     Discharge Instructions      The COVID/flu test was negative. Take medication as prescribed. Recommend taking your allergy medication daily while symptoms persist. Increase fluids and allow for plenty of rest. You may take over-the-counter Tylenol  or ibuprofen  as needed for pain, fever, or general discomfort. Recommend the use of normal saline nasal spray for nasal congestion and runny nose. For your cough, recommend use of a humidifier in your bedroom at nighttime during sleep and sleeping elevated on pillows while symptoms persist. Symptoms should improve over the next 5 to 7 days.  If symptoms fail to improve, or appear to worsen, you may follow-up in this clinic or with your primary care physician for further evaluation. Follow-up as needed.     ED Prescriptions     Medication Sig Dispense Auth. Provider   brompheniramine-pseudoephedrine-DM 30-2-10 MG/5ML syrup Take 5 mLs by mouth 4 (four) times daily as needed. 140 mL Leath-Warren, Etta PARAS, NP   fluticasone  (FLONASE ) 50 MCG/ACT nasal spray Place 2 sprays into both nostrils daily. 16 g  Leath-Warren, Etta PARAS, NP   brompheniramine-pseudoephedrine-DM 30-2-10 MG/5ML syrup Take 5 mLs by mouth 4 (four) times daily as needed. 140 mL Leath-Warren, Etta PARAS, NP   fluticasone  (FLONASE ) 50 MCG/ACT nasal spray Place 2 sprays into both nostrils daily. 16 g Leath-Warren, Etta PARAS, NP      PDMP not reviewed this encounter.   Gilmer Etta PARAS, NP 09/19/23 1708

## 2023-09-26 ENCOUNTER — Ambulatory Visit
Admission: EM | Admit: 2023-09-26 | Discharge: 2023-09-26 | Disposition: A | Discharge status: Home / Self Care | Attending: Family Medicine | Admitting: Family Medicine

## 2023-09-26 DIAGNOSIS — U071 COVID-19: Secondary | ICD-10-CM

## 2023-09-26 DIAGNOSIS — Z20822 Contact with and (suspected) exposure to covid-19: Secondary | ICD-10-CM

## 2023-09-26 LAB — POC SOFIA SARS ANTIGEN FIA: SARS Coronavirus 2 Ag: POSITIVE — AB

## 2023-09-26 MED ORDER — AZELASTINE HCL 0.1 % NA SOLN: 1.0000 | Freq: Two times a day (BID) | NASAL | 0 refills | Status: AC

## 2023-09-26 MED ORDER — PROMETHAZINE-DM 6.25-15 MG/5ML PO SYRP: 5.0000 mL | ORAL_SOLUTION | Freq: Four times a day (QID) | ORAL | 0 refills | Status: AC | PRN

## 2023-09-26 NOTE — ED Triage Notes (Signed)
 Reports sinus pressure, pain behind nose, eyes, and scratchy x 1 day  Took prometh  cough syrup

## 2023-09-26 NOTE — ED Provider Notes (Signed)
 [facility]    CSN: [id number] Arrival date & time: 09/26/23  1046      History   Chief Complaint No chief complaint on file.   HPI Hannah Osborn is a 38 y.o. female.   Patient today with 1 day history of nasal congestion, sinus pressure, watery eyes, scratchy throat, cough.  Denies fever, chest pain, shortness of breath, abdominal pain, vomiting, diarrhea.  Son sick with similar symptoms.  So far trying Phenergan  DM cough syrup that she was given last week with mild temporary benefit.    Past Medical History:  Diagnosis Date   Allergy    Anemia    Anxiety    Anxiety and depression 03/21/2020   COVID-19    Fibroid 01/08/2016   Hypothyroid 04/03/2017   Hypothyroidism    Screen for STD (sexually transmitted disease) 10/26/2020   Thyroid  disease     Patient Active Problem List   Diagnosis Date Noted   Subareolar mass of left breast 07/07/2023   Breast pain, left 07/07/2023   Hematuria 04/28/2023   Abdominal pressure 04/28/2023   Left elbow tendonitis 11/15/2022   Chronic pain 09/19/2022   IDA (iron deficiency anemia) 09/17/2022   Fibroids 07/06/2022   Bloating 03/29/2021   Pelvic pain 02/15/2021   Screen for STD (sexually transmitted disease) 10/26/2020   Rectocele 03/21/2020   Anxiety 03/21/2020   Encounter for gynecological examination with Papanicolaou smear of cervix 03/21/2020   Hemorrhoids 03/21/2020   Dysmenorrhea 03/21/2020   Screening examination for STD (sexually transmitted disease) 07/22/2018   Encounter for well woman exam with routine gynecological exam 07/22/2018   Hypothyroidism 07/22/2018   Hypothyroid 04/03/2017   Internal and external bleeding hemorrhoids    Diarrhea, functional 03/06/2016   Rectal bleeding 03/06/2016   Fibroid 01/08/2016   Thyroid  disorder 12/18/2015   Interstitial cystitis 12/18/2015   Anemia 12/18/2015   Functional ovarian cysts 12/18/2015   Menorrhagia 12/18/2015   Class 1 obesity due to excess  calories with body mass index (BMI) of 34.0 to 34.9 in adult 12/18/2015   Environmental allergies 12/18/2015    Past Surgical History:  Procedure Laterality Date   BIOPSY  11/29/2021   Procedure: BIOPSY;  Surgeon: Cindie Carlin POUR, DO;  Location: [facility];  Service: Endoscopy;;   CHOLECYSTECTOMY     COLONOSCOPY WITH PROPOFOL  N/A 11/29/2021   Procedure: COLONOSCOPY WITH PROPOFOL ;  Surgeon: Cindie Carlin POUR, DO;  Location: [facility];  Service: Endoscopy;  Laterality: N/A;  2:15 pm, pt knows to arrive at 10:15   DILITATION & CURRETTAGE/HYSTROSCOPY WITH NOVASURE ABLATION N/A 02/02/2016   Procedure: DILATATION & CURETTAGE/HYSTEROSCOPY WITH NOVASURE ABLATION (procedure 2);  Surgeon: Vonn VEAR Inch, MD;  Location: [facility];  Service: Gynecology;  Laterality: N/A;   ESOPHAGOGASTRODUODENOSCOPY (EGD) WITH PROPOFOL  N/A 11/29/2021   Procedure: ESOPHAGOGASTRODUODENOSCOPY (EGD) WITH PROPOFOL ;  Surgeon: Cindie Carlin POUR, DO;  Location: [facility];  Service: Endoscopy;  Laterality: N/A;   HEMORRHOID SURGERY N/A 05/01/2016   Procedure: EXTENSIVE HEMORRHOIDECTOMY;  Surgeon: Oneil Budge, MD;  Location: [facility];  Service: General;  Laterality: N/A;   LAPAROSCOPIC BILATERAL SALPINGECTOMY Bilateral 02/02/2016   Procedure: LAPAROSCOPIC BILATERAL SALPINGECTOMY (procedure #1);  Surgeon: Vonn VEAR Inch, MD;  Location: [facility];  Service: Gynecology;  Laterality: Bilateral;   ROBOTIC ASSISTED LAPAROSCOPIC HYSTERECTOMY AND SALPINGECTOMY N/A 07/03/2022   Procedure: XI ROBOTIC ASSISTED LAPAROSCOPIC HYSTERECTOMY, TAP BLOCK;  Surgeon: Inch Vonn VEAR, MD;  Location: [facility];  Service: Gynecology;  Laterality: N/A;   TONSILLECTOMY  TUBAL LIGATION      OB History     Gravida  1   Para  1   Term  1   Preterm      AB      Living  1      SAB      IAB      Ectopic      Multiple      Live Births  1            Home Medications    Prior to Admission medications   Medication Sig Start Date End  Date Taking? Authorizing Provider  azelastine  (ASTELIN ) 0.1 % nasal spray Place 1 spray into both nostrils 2 (two) times daily. Use in each nostril as directed 09/26/23  Yes Stuart Vernell Norris, PA-C  promethazine -dextromethorphan (PROMETHAZINE -DM) 6.25-15 MG/5ML syrup Take 5 mLs by mouth 4 (four) times daily as needed. 09/26/23  Yes Stuart Vernell Norris, PA-C  albuterol  (VENTOLIN  HFA) 108 (90 Base) MCG/ACT inhaler Inhale 1-2 puffs into the lungs every 6 (six) hours as needed for wheezing or shortness of breath. 08/22/22   Del Wilhelmena Lloyd Sola, FNP  brompheniramine-pseudoephedrine-DM 30-2-10 MG/5ML syrup Take 5 mLs by mouth 4 (four) times daily as needed. 09/19/23   Leath-Warren, Etta PARAS, NP  DULoxetine  (CYMBALTA ) 30 MG capsule Take 1 capsule (30 mg total) by mouth daily. 09/19/22   Del Orbe Polanco, Iliana, FNP  fluticasone  (FLONASE ) 50 MCG/ACT nasal spray Place 2 sprays into both nostrils daily. 09/19/23   Leath-Warren, Etta PARAS, NP  ibuprofen  (ADVIL ) 800 MG tablet Take 800 mg by mouth every 8 (eight) hours as needed. 09/19/22   [provider]  linaclotide  (LINZESS ) 290 MCG CAPS capsule Take 1 capsule (290 mcg total) by mouth daily before breakfast. 04/03/23   Rudy Josette RAMAN, PA-C  methocarbamol  (ROBAXIN ) 500 MG tablet Take 1 tablet (500 mg total) by mouth 2 (two) times daily. 06/24/22   Leath-Warren, Etta PARAS, NP  pantoprazole  (PROTONIX ) 40 MG tablet Take 1 tablet (40 mg total) by mouth daily before breakfast. 04/03/23   Rudy Josette RAMAN, PA-C  polyethylene glycol powder (GLYCOLAX /MIRALAX ) 17 GM/SCOOP powder 1 scoop daily or as needed 04/02/22   Jayne Vonn DEL, MD  rosuvastatin  (CRESTOR ) 10 MG tablet Take 1 tablet (10 mg total) by mouth daily. 08/25/22   Del Wilhelmena Lloyd Sola, FNP  SSD 1 % cream Apply 1 Application topically 2 (two) times daily. Patient not taking: Reported on 09/09/2023 05/24/23   [provider]    Family History Family History  Problem Relation Age of  Onset   Pulmonary embolism Mother    Early death Mother    Cancer Father        stomach   Early death Father    Asthma Son    Allergies Son    Heart disease Maternal Uncle    Cancer Maternal Grandmother        uterine   Cancer Maternal Grandfather        colon    Social History Social History   Tobacco Use   Smoking status: Never   Smokeless tobacco: Never  Vaping Use   Vaping status: Never Used  Substance Use Topics   Alcohol use: Yes    Comment: occasional   Drug use: No     Allergies   Patient has no known allergies.   Review of Systems Review of Systems Per HPI  Physical Exam Triage Vital Signs ED Triage Vitals  Encounter Vitals  Group     BP 09/26/23 1137 108/76     Girls Systolic BP Percentile --      Girls Diastolic BP Percentile --      Boys Systolic BP Percentile --      Boys Diastolic BP Percentile --      Pulse Rate 09/26/23 1137 78     Resp 09/26/23 1137 16     Temp 09/26/23 1137 98.9 F (37.2 C)     Temp Source 09/26/23 1137 Oral     SpO2 09/26/23 1137 96 %     Weight --      Height --      Head Circumference --      Peak Flow --      Pain Score 09/26/23 1136 0     Pain Loc --      Pain Education --      Exclude from Growth Chart --    No data found.  Updated Vital Signs BP 108/76 (BP Location: Right Arm)   Pulse 78   Temp 98.9 F (37.2 C) (Oral)   Resp 16   LMP 03/14/2022 (Approximate)   SpO2 96%   Visual Acuity Right Eye Distance:   Left Eye Distance:   Bilateral Distance:    Right Eye Near:   Left Eye Near:    Bilateral Near:     Physical Exam Vitals and nursing note reviewed.  Constitutional:      Appearance: Normal appearance.  HENT:     Head: Atraumatic.     Right Ear: Tympanic membrane and external ear normal.     Left Ear: Tympanic membrane and external ear normal.     Nose: Rhinorrhea present.     Mouth/Throat:     Mouth: Mucous membranes are moist.     Pharynx: Posterior oropharyngeal erythema present.   Eyes:     Extraocular Movements: Extraocular movements intact.     Conjunctiva/sclera: Conjunctivae normal.  Cardiovascular:     Rate and Rhythm: Normal rate and regular rhythm.     Heart sounds: Normal heart sounds.  Pulmonary:     Effort: Pulmonary effort is normal.     Breath sounds: Normal breath sounds. No wheezing or rales.  Musculoskeletal:        General: Normal range of motion.     Cervical back: Normal range of motion and neck supple.  Skin:    General: Skin is warm and dry.  Neurological:     Mental Status: She is alert and oriented to person, place, and time.  Psychiatric:        Mood and Affect: Mood normal.        Thought Content: Thought content normal.      UC Treatments / Results  Labs (all labs ordered are listed, but only abnormal results are displayed) Labs Reviewed  POC SOFIA SARS ANTIGEN FIA - Abnormal; Notable for the following components:      Result Value   SARS Coronavirus 2 Ag Positive (*)    All other components within normal limits    EKG   Radiology No results found.  Procedures Procedures (including critical care time)  Medications Ordered in UC Medications - No data to display  Initial Impression / Assessment and Plan / UC Course  I have reviewed the triage vital signs and the nursing notes.  Pertinent labs & imaging results that were available during my care of the patient were reviewed by me and considered in my medical decision making (  see chart for details).     Rapid COVID-positive, treat with Astelin , Phenergan  DM, supportive over-the-counter medications and home care.  Work note given.  Return for worsening symptoms.  Final Clinical Impressions(s) / UC Diagnoses   Final diagnoses:  Exposure to COVID-19 virus  COVID-19   Discharge Instructions   None    ED Prescriptions     Medication Sig Dispense Auth. Provider   azelastine  (ASTELIN ) 0.1 % nasal spray Place 1 spray into both nostrils 2 (two) times daily. Use  in each nostril as directed 30 mL Stuart Vernell Norris, PA-C   promethazine -dextromethorphan (PROMETHAZINE -DM) 6.25-15 MG/5ML syrup Take 5 mLs by mouth 4 (four) times daily as needed. 100 mL Stuart Vernell Norris, [place]      PDMP not reviewed this encounter.   Stuart Vernell Norris, [place] 09/26/23 1244

## 2023-09-29 ENCOUNTER — Ambulatory Visit
Admission: RE | Admit: 2023-09-29 | Discharge: 2023-09-29 | Disposition: A | Discharge status: Home / Self Care | Source: Ambulatory Visit | Attending: Internal Medicine | Admitting: Internal Medicine

## 2023-09-29 VITALS — BP 123/87 | HR 95 | Temp 99.0°F | Resp 18

## 2023-09-29 DIAGNOSIS — R0602 Shortness of breath: Secondary | ICD-10-CM | POA: Diagnosis not present

## 2023-09-29 DIAGNOSIS — J45909 Unspecified asthma, uncomplicated: Secondary | ICD-10-CM

## 2023-09-29 DIAGNOSIS — U071 COVID-19: Secondary | ICD-10-CM

## 2023-09-29 MED ORDER — ALBUTEROL SULFATE HFA 108 (90 BASE) MCG/ACT IN AERS: 2.0000 | INHALATION_SPRAY | Freq: Four times a day (QID) | RESPIRATORY_TRACT | 0 refills | Status: AC | PRN

## 2023-09-29 MED ORDER — PAXLOVID (300/100) 20 X 150 MG & 10 X 100MG PO TBPK: 3.0000 | ORAL_TABLET | Freq: Two times a day (BID) | ORAL | 0 refills | Status: AC

## 2023-09-29 MED ORDER — GUAIFENESIN ER 1200 MG PO TB12: 1200.0000 mg | ORAL_TABLET | Freq: Two times a day (BID) | ORAL | 0 refills | Status: AC

## 2023-09-29 MED ORDER — ONDANSETRON 4 MG PO TBDP: 4.0000 mg | ORAL_TABLET | Freq: Three times a day (TID) | ORAL | 0 refills | Status: AC | PRN

## 2023-09-29 NOTE — Discharge Instructions (Addendum)
 You have COVID-19 viral illness. Wear a mask for 5 days of symptoms. You may return to public within those 5 days as long as you do not have a fever. Wash hands frequently.  Take paxlovid  as prescribed. Start medicine tonight. The sooner the paxlovid  is started, the better it works in Public relations account executive.  Hold off on taking the following medications while you are taking paxlovid : Rosuvastatin  (Crestor ).  You may start taking your rosuvastatin  (cholesterol medication) again after you are finished with Paxlovid .  Plain Guaifenesin  (Mucinex ) may help break up mucous and can be purchased over the counter.   Take Promethazine  DM cough medication to help with your cough at nighttime so that you are able to sleep. Do not drive, drink alcohol, or go to work while taking this medication since it can make you sleepy.   Zofran  every 8 hours as needed for nausea and vomiting.  Two teaspoons of honey in warm water  every 4-6 hours may help with throat pains Humidifier in your room at night to help add water  the air and soothe cough  If you develop any new or worsening symptoms or if your symptoms do not start to improve, please return here or follow-up with your primary care provider. If your symptoms are severe, please go to the emergency room.

## 2023-09-29 NOTE — ED Provider Notes (Signed)
 [facility]    CSN: [id number] Arrival date & time: 09/29/23  0851      History   Chief Complaint Chief Complaint  Patient presents with   Letter for School/Work    I was seen Friday, diagnosed with Covid. I received a note to return to work Monday but I do not feel capable of returning Monday. 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 I still feel weak, congested and dizzy. - Entered by patient    HPI Hannah Osborn is a 38 y.o. female.   Hannah Osborn is a 39 y.o. female presenting for chief complaint of persistent COVID-19 symptoms.  Seen at urgent care the morning of September 26, 2023 (day 1 of symptoms) and tested positive for COVID-19.  She was prescribed azelastine  and Promethazine  DM for symptomatic relief and given work note for 3 days.  She is back today due to persistent symptoms and she feels as though she cannot return to work today.   Cough is mostly dry and nonproductive.  She has developed mild shortness of breath over the last 24 to 48 hours.  History of asthma, she has not felt any wheezing to the chest or chest tightness.  Reports intermittent nausea without vomiting as well as chills without documented fever. Denies abdominal pain, rash, diarrhea, and changes in appetite/fluid intake.  Denies recent antibiotic or steroid use. Taking azelastine  and Promethazine  DM at home with minimal relief in symptoms. She is not taking Paxlovid      Past Medical History:  Diagnosis Date   Allergy    Anemia    Anxiety    Anxiety and depression 03/21/2020   COVID-19    Fibroid 01/08/2016   Hypothyroid 04/03/2017   Hypothyroidism    Screen for STD (sexually transmitted disease) 10/26/2020   Thyroid  disease     Patient Active Problem List   Diagnosis Date Noted   Subareolar mass of left breast 07/07/2023   Breast pain, left 07/07/2023   Hematuria 04/28/2023   Abdominal pressure 04/28/2023   Left elbow tendonitis 11/15/2022    Chronic pain 09/19/2022   IDA (iron deficiency anemia) 09/17/2022   Fibroids 07/06/2022   Bloating 03/29/2021   Pelvic pain 02/15/2021   Screen for STD (sexually transmitted disease) 10/26/2020   Rectocele 03/21/2020   Anxiety 03/21/2020   Encounter for gynecological examination with Papanicolaou smear of cervix 03/21/2020   Hemorrhoids 03/21/2020   Dysmenorrhea 03/21/2020   Screening examination for STD (sexually transmitted disease) 07/22/2018   Encounter for well woman exam with routine gynecological exam 07/22/2018   Hypothyroidism 07/22/2018   Hypothyroid 04/03/2017   Internal and external bleeding hemorrhoids    Diarrhea, functional 03/06/2016   Rectal bleeding 03/06/2016   Fibroid 01/08/2016   Thyroid  disorder 12/18/2015   Interstitial cystitis 12/18/2015   Anemia 12/18/2015   Functional ovarian cysts 12/18/2015   Menorrhagia 12/18/2015   Class 1 obesity due to excess calories with body mass index (BMI) of 34.0 to 34.9 in adult 12/18/2015   Environmental allergies 12/18/2015    Past Surgical History:  Procedure Laterality Date   BIOPSY  11/29/2021   Procedure: BIOPSY;  Surgeon: Cindie Carlin POUR, DO;  Location: [facility];  Service: Endoscopy;;   CHOLECYSTECTOMY     COLONOSCOPY WITH PROPOFOL  N/A 11/29/2021   Procedure: COLONOSCOPY WITH PROPOFOL ;  Surgeon: Cindie Carlin POUR, DO;  Location: [facility];  Service: Endoscopy;  Laterality: N/A;  2:15 pm, pt knows to  arrive at 10:15   DILITATION & CURRETTAGE/HYSTROSCOPY WITH NOVASURE ABLATION N/A 02/02/2016   Procedure: DILATATION & CURETTAGE/HYSTEROSCOPY WITH NOVASURE ABLATION (procedure 2);  Surgeon: Vonn VEAR Inch, MD;  Location: [facility];  Service: Gynecology;  Laterality: N/A;   ESOPHAGOGASTRODUODENOSCOPY (EGD) WITH PROPOFOL  N/A 11/29/2021   Procedure: ESOPHAGOGASTRODUODENOSCOPY (EGD) WITH PROPOFOL ;  Surgeon: Cindie Carlin POUR, DO;  Location: [facility];  Service: Endoscopy;  Laterality: N/A;   HEMORRHOID SURGERY N/A  05/01/2016   Procedure: EXTENSIVE HEMORRHOIDECTOMY;  Surgeon: Oneil Budge, MD;  Location: [facility];  Service: General;  Laterality: N/A;   LAPAROSCOPIC BILATERAL SALPINGECTOMY Bilateral 02/02/2016   Procedure: LAPAROSCOPIC BILATERAL SALPINGECTOMY (procedure #1);  Surgeon: Vonn VEAR Inch, MD;  Location: [facility];  Service: Gynecology;  Laterality: Bilateral;   ROBOTIC ASSISTED LAPAROSCOPIC HYSTERECTOMY AND SALPINGECTOMY N/A 07/03/2022   Procedure: XI ROBOTIC ASSISTED LAPAROSCOPIC HYSTERECTOMY, TAP BLOCK;  Surgeon: Inch Vonn VEAR, MD;  Location: [facility];  Service: Gynecology;  Laterality: N/A;   TONSILLECTOMY     TUBAL LIGATION      OB History     Gravida  1   Para  1   Term  1   Preterm      AB      Living  1      SAB      IAB      Ectopic      Multiple      Live Births  1            Home Medications    Prior to Admission medications   Medication Sig Start Date End Date Taking? Authorizing Provider  albuterol  (VENTOLIN  HFA) 108 (90 Base) MCG/ACT inhaler Inhale 2 puffs into the lungs every 6 (six) hours as needed for wheezing or shortness of breath. 09/29/23  Yes Enedelia Dorna HERO, FNP  Guaifenesin  1200 MG TB12 Take 1 tablet (1,200 mg total) by mouth in the morning and at bedtime. 09/29/23  Yes Enedelia Dorna HERO, FNP  nirmatrelvir/ritonavir (PAXLOVID , 300/100,) 20 x 150 MG & 10 x 100MG  TBPK Take 3 tablets by mouth 2 (two) times daily for 5 days. Patient GFR is 117. Take nirmatrelvir (150 mg) two tablets twice daily for 5 days and ritonavir (100 mg) one tablet twice daily for 5 days. 09/29/23 10/04/23 Yes Esterlene Atiyeh, Dorna HERO, FNP  ondansetron  (ZOFRAN -ODT) 4 MG disintegrating tablet Take 1 tablet (4 mg total) by mouth every 8 (eight) hours as needed for nausea or vomiting. 09/29/23  Yes Enedelia Dorna HERO, FNP  azelastine  (ASTELIN ) 0.1 % nasal spray Place 1 spray into both nostrils 2 (two) times daily. Use in each nostril as directed 09/26/23   Stuart Vernell Norris, PA-C   brompheniramine-pseudoephedrine-DM 30-2-10 MG/5ML syrup Take 5 mLs by mouth 4 (four) times daily as needed. 09/19/23   Leath-Warren, Etta PARAS, NP  DULoxetine  (CYMBALTA ) 30 MG capsule Take 1 capsule (30 mg total) by mouth daily. 09/19/22   Del Wilhelmena Lloyd Sola, FNP  fluticasone  (FLONASE ) 50 MCG/ACT nasal spray Place 2 sprays into both nostrils daily. 09/19/23   Leath-Warren, Etta PARAS, NP  ibuprofen  (ADVIL ) 800 MG tablet Take 800 mg by mouth every 8 (eight) hours as needed. 09/19/22   [provider]  linaclotide  (LINZESS ) 290 MCG CAPS capsule Take 1 capsule (290 mcg total) by mouth daily before breakfast. 04/03/23   Rudy Josette RAMAN, PA-C  methocarbamol  (ROBAXIN ) 500 MG tablet Take 1 tablet (500 mg total) by mouth 2 (two) times daily. 06/24/22   Leath-Warren, Etta PARAS,  NP  pantoprazole  (PROTONIX ) 40 MG tablet Take 1 tablet (40 mg total) by mouth daily before breakfast. 04/03/23   Rudy Josette RAMAN, PA-C  polyethylene glycol powder (GLYCOLAX /MIRALAX ) 17 GM/SCOOP powder 1 scoop daily or as needed 04/02/22   Jayne Vonn DEL, MD  promethazine -dextromethorphan (PROMETHAZINE -DM) 6.25-15 MG/5ML syrup Take 5 mLs by mouth 4 (four) times daily as needed. 09/26/23   Stuart Vernell Norris, PA-C  rosuvastatin  (CRESTOR ) 10 MG tablet Take 1 tablet (10 mg total) by mouth daily. 08/25/22   Del Wilhelmena Lloyd Sola, FNP  SSD 1 % cream Apply 1 Application topically 2 (two) times daily. Patient not taking: Reported on 09/09/2023 05/24/23   [provider]    Family History Family History  Problem Relation Age of Onset   Pulmonary embolism Mother    Early death Mother    Cancer Father        stomach   Early death Father    Asthma Son    Allergies Son    Heart disease Maternal Uncle    Cancer Maternal Grandmother        uterine   Cancer Maternal Grandfather        colon    Social History Social History   Tobacco Use   Smoking status: Never   Smokeless tobacco: Never  Vaping Use   Vaping  status: Never Used  Substance Use Topics   Alcohol use: Yes    Comment: occasional   Drug use: No     Allergies   Patient has no known allergies.   Review of Systems Review of Systems Per HPI  Physical Exam Triage Vital Signs ED Triage Vitals  Encounter Vitals Group     BP 09/29/23 0912 123/87     Girls Systolic BP Percentile --      Girls Diastolic BP Percentile --      Boys Systolic BP Percentile --      Boys Diastolic BP Percentile --      Pulse Rate 09/29/23 0912 95     Resp 09/29/23 0912 18     Temp 09/29/23 0912 99 F (37.2 C)     Temp Source 09/29/23 0912 Oral     SpO2 09/29/23 0912 97 %     Weight --      Height --      Head Circumference --      Peak Flow --      Pain Score 09/29/23 0913 0     Pain Loc --      Pain Education --      Exclude from Growth Chart --    No data found.  Updated Vital Signs BP 123/87 (BP Location: Right Arm)   Pulse 95   Temp 99 F (37.2 C) (Oral)   Resp 18   LMP 03/14/2022 (Approximate)   SpO2 97%   Visual Acuity Right Eye Distance:   Left Eye Distance:   Bilateral Distance:    Right Eye Near:   Left Eye Near:    Bilateral Near:     Physical Exam Vitals and nursing note reviewed.  Constitutional:      Appearance: She is ill-appearing. She is not toxic-appearing.  HENT:     Head: Normocephalic and atraumatic.     Right Ear: Hearing, tympanic membrane, ear canal and external ear normal.     Left Ear: Hearing, tympanic membrane, ear canal and external ear normal.     Nose: Congestion present.     Mouth/Throat:  Lips: Pink.     Mouth: Mucous membranes are moist. No injury or oral lesions.     Dentition: Normal dentition.     Tongue: No lesions.     Pharynx: Oropharynx is clear. Uvula midline. No pharyngeal swelling, oropharyngeal exudate, posterior oropharyngeal erythema, uvula swelling or postnasal drip.     Tonsils: No tonsillar exudate.  Eyes:     General: Lids are normal. Vision grossly intact. Gaze  aligned appropriately.     Extraocular Movements: Extraocular movements intact.     Conjunctiva/sclera: Conjunctivae normal.  Neck:     Trachea: Trachea and phonation normal.  Cardiovascular:     Rate and Rhythm: Normal rate and regular rhythm.     Heart sounds: Normal heart sounds, S1 normal and S2 normal.  Pulmonary:     Effort: Pulmonary effort is normal. No respiratory distress.     Breath sounds: Normal air entry. No wheezing, rhonchi or rales.     Comments: Normal breath sounds throughout, able to take in deep breath without cough. Chest:     Chest wall: No tenderness.  Musculoskeletal:     Cervical back: Neck supple.  Lymphadenopathy:     Cervical: No cervical adenopathy.  Skin:    General: Skin is warm and dry.     Capillary Refill: Capillary refill takes less than 2 seconds.     Findings: No rash.  Neurological:     General: No focal deficit present.     Mental Status: She is alert and oriented to person, place, and time. Mental status is at baseline.     Cranial Nerves: No dysarthria or facial asymmetry.  Psychiatric:        Mood and Affect: Mood normal.        Speech: Speech normal.        Behavior: Behavior normal.        Thought Content: Thought content normal.        Judgment: Judgment normal.      UC Treatments / Results  Labs (all labs ordered are listed, but only abnormal results are displayed) Labs Reviewed - No data to display  EKG   Radiology No results found.  Procedures Procedures (including critical care time)  Medications Ordered in UC Medications - No data to display  Initial Impression / Assessment and Plan / UC Course  I have reviewed the triage vital signs and the nursing notes.  Pertinent labs & imaging results that were available during my care of the patient were reviewed by me and considered in my medical decision making (see chart for details).   1.  COVID-19 virus infection, shortness of breath, asthma without acute  exacerbation COVID testing is positive at previous visit on chart review.  Reviewed previous provider note, Paxlovid  was not discussed.  Patient is a candidate for Paxlovid  due to risk factors for severe COVID-19 illness.  She is on day 4 of symptoms currently. Most recent GFR from BMP in 2024 is 117. She is to hold rosuvastatin  while taking antiviral, then resume after finished.  Lungs clear, therefore deferred imaging of the chest. Albuterol  inhaler refilled to be used as needed for shortness of breath. No current signs of asthma exacerbation.  We will hold off on steroids for now.  Advised to return to urgent care for reevaluation if she is using albuterol  frequently (2-3 times per day).  Discussed most up to date CDC guidelines regarding quarantine and masking to prevent transmission.  Recommend supportive care for symptomatic relief  as outlined in AVS.    Work note given.  Counseled patient on potential for adverse effects with medications prescribed/recommended today, strict ER and return-to-clinic precautions discussed, patient verbalized understanding.    Final Clinical Impressions(s) / UC Diagnoses   Final diagnoses:  COVID-19 virus infection  Shortness of breath  Asthma without acute exacerbation     Discharge Instructions      You have COVID-19 viral illness. Wear a mask for 5 days of symptoms. You may return to public within those 5 days as long as you do not have a fever. Wash hands frequently.  Take paxlovid  as prescribed. Start medicine tonight. The sooner the paxlovid  is started, the better it works in Public relations account executive.  Hold off on taking the following medications while you are taking paxlovid : Rosuvastatin  (Crestor ).  You may start taking your rosuvastatin  (cholesterol medication) again after you are finished with Paxlovid .  Plain Guaifenesin  (Mucinex ) may help break up mucous and can be purchased over the counter.   Take Promethazine  DM cough medication to help  with your cough at nighttime so that you are able to sleep. Do not drive, drink alcohol, or go to work while taking this medication since it can make you sleepy.   Zofran  every 8 hours as needed for nausea and vomiting.  Two teaspoons of honey in warm water  every 4-6 hours may help with throat pains Humidifier in your room at night to help add water  the air and soothe cough  If you develop any new or worsening symptoms or if your symptoms do not start to improve, please return here or follow-up with your primary care provider. If your symptoms are severe, please go to the emergency room.    ED Prescriptions     Medication Sig Dispense Auth. Provider   Guaifenesin  1200 MG TB12 Take 1 tablet (1,200 mg total) by mouth in the morning and at bedtime. 14 tablet Enedelia Dorna HERO, FNP   ondansetron  (ZOFRAN -ODT) 4 MG disintegrating tablet Take 1 tablet (4 mg total) by mouth every 8 (eight) hours as needed for nausea or vomiting. 20 tablet Drew Herman M, FNP   nirmatrelvir/ritonavir (PAXLOVID , 300/100,) 20 x 150 MG & 10 x 100MG  TBPK Take 3 tablets by mouth 2 (two) times daily for 5 days. Patient GFR is 117. Take nirmatrelvir (150 mg) two tablets twice daily for 5 days and ritonavir (100 mg) one tablet twice daily for 5 days. 30 tablet Holt Woolbright M, FNP   albuterol  (VENTOLIN  HFA) 108 (90 Base) MCG/ACT inhaler Inhale 2 puffs into the lungs every 6 (six) hours as needed for wheezing or shortness of breath. 18 g Enedelia Dorna HERO, FNP      PDMP not reviewed this encounter.   Enedelia Dorna Del[place], [place] 09/29/23 [phone]

## 2023-09-29 NOTE — ED Triage Notes (Signed)
 Pt was seen on Friday 09/26/23 and dx with covid. Pt states she is still feeling bad and would like a work note for a few days.

## 2023-10-21 ENCOUNTER — Encounter: Payer: Self-pay | Admitting: Oncology

## 2024-01-06 ENCOUNTER — Inpatient Hospital Stay: Attending: Physician Assistant

## 2024-01-06 DIAGNOSIS — M79659 Pain in unspecified thigh: Secondary | ICD-10-CM | POA: Insufficient documentation

## 2024-01-06 DIAGNOSIS — E538 Deficiency of other specified B group vitamins: Secondary | ICD-10-CM | POA: Insufficient documentation

## 2024-01-06 DIAGNOSIS — Z800 Family history of malignant neoplasm of digestive organs: Secondary | ICD-10-CM | POA: Diagnosis not present

## 2024-01-06 DIAGNOSIS — D500 Iron deficiency anemia secondary to blood loss (chronic): Secondary | ICD-10-CM

## 2024-01-06 DIAGNOSIS — D509 Iron deficiency anemia, unspecified: Secondary | ICD-10-CM | POA: Insufficient documentation

## 2024-01-06 DIAGNOSIS — R102 Pelvic and perineal pain unspecified side: Secondary | ICD-10-CM | POA: Insufficient documentation

## 2024-01-06 DIAGNOSIS — M25559 Pain in unspecified hip: Secondary | ICD-10-CM

## 2024-01-06 LAB — CBC WITH DIFFERENTIAL/PLATELET
Abs Immature Granulocytes: 0.01 K/uL (ref 0.00–0.07)
Basophils Absolute: 0 K/uL (ref 0.0–0.1)
Basophils Relative: 0 %
Eosinophils Absolute: 0 K/uL (ref 0.0–0.5)
Eosinophils Relative: 1 %
HCT: 38.3 % (ref 36.0–46.0)
Hemoglobin: 12.3 g/dL (ref 12.0–15.0)
Immature Granulocytes: 0 %
Lymphocytes Relative: 24 %
Lymphs Abs: 1.4 K/uL (ref 0.7–4.0)
MCH: 26.3 pg (ref 26.0–34.0)
MCHC: 32.1 g/dL (ref 30.0–36.0)
MCV: 82 fL (ref 80.0–100.0)
Monocytes Absolute: 0.3 K/uL (ref 0.1–1.0)
Monocytes Relative: 6 %
Neutro Abs: 4.1 K/uL (ref 1.7–7.7)
Neutrophils Relative %: 69 %
Platelets: 303 K/uL (ref 150–400)
RBC: 4.67 MIL/uL (ref 3.87–5.11)
RDW: 14.3 % (ref 11.5–15.5)
WBC: 5.9 K/uL (ref 4.0–10.5)
nRBC: 0 % (ref 0.0–0.2)

## 2024-01-06 LAB — LACTATE DEHYDROGENASE: LDH: 250 U/L — ABNORMAL HIGH (ref 105–235)

## 2024-01-06 LAB — IRON AND TIBC
Iron: 60 ug/dL (ref 28–170)
Saturation Ratios: 19 % (ref 10.4–31.8)
TIBC: 308 ug/dL (ref 250–450)
UIBC: 248 ug/dL

## 2024-01-06 LAB — FOLATE: Folate: 5.3 ng/mL — ABNORMAL LOW (ref >5.9)

## 2024-01-06 LAB — VITAMIN B12: Vitamin B-12: 524 pg/mL (ref 180–914)

## 2024-01-06 LAB — FERRITIN: Ferritin: 178 ng/mL (ref 11–307)

## 2024-01-07 LAB — RHEUMATOID FACTOR: Rheumatoid fact SerPl-aCnc: <10 [IU]/mL (ref <14.0)

## 2024-01-07 LAB — ANTINUCLEAR ANTIBODIES, IFA: ANA Ab, IFA: NEGATIVE

## 2024-01-08 LAB — METHYLMALONIC ACID, SERUM: Methylmalonic Acid, Quantitative: 218 nmol/L (ref 0–378)

## 2024-01-12 NOTE — Progress Notes (Unsigned)
 "  VIRTUAL VISIT via TELEPHONE NOTE [facility]   I connected with Hannah Osborn  on 01/13/2024 at  8:30 AM by telephone and verified that I am speaking with the correct person using two identifiers.  Location: Patient: Home Provider: [facility]   I discussed the limitations, risks, security and privacy concerns of performing an evaluation and management service by telephone and the availability of in person appointments. I also discussed with the patient that there may be a patient responsible charge related to this service. The patient expressed understanding and agreed to proceed.  REASON FOR VISIT:  Follow-up for iron deficiency anemia  PRIOR THERAPY: Oral iron (unable to tolerate due to GI upset)  CURRENT THERAPY: Intermittent IV iron  INTERVAL HISTORY:   Hannah Osborn 38y.o. female returns for routine follow-up of iron deficiency anemia.   She was last evaluated via telemedicine visit by NP Delon Hope on 09/09/2023. Last IV iron with Feraheme  x 2 in December 2024/January 2025.  At today's visit, she reports feeling fair. She has 100% energy and 100% appetite. She endorses that she is maintaining a stable weight.  She reports intermittent fatigue that comes and goes. She continues to have issues with back and leg pain.   She previously had issues with heavy menstrual periods (uterine fibroids), but this resolved after hysterectomy in June 2024.  She does report bright red blood dripping into the toilet with most bowel movements, and reports that she strains due to constipation.  No melena. She has intermittent headaches. No lightheadedness, syncope, chest pain, or dyspnea on exertion. She is taking daily Flintstones multivitamin with iron, tolerating this well.  ASSESSMENT & PLAN:  1.  Iron deficiency anemia, RESOLVED - No prior blood transfusion. - History of menorrhagia due to uterine fibroids, s/p hysterectomy in June 2024 - Patient chart  lists that patient's mother had history of sickle cell trait, but patient denies any knowledge of this. - Hemoglobinopathy panel was not checked, as microcytic anemia has resolved following correction of iron levels - ANA and rheumatoid factor were normal. - Rectal bleeding when straining for bowel movements, following with [facility], PA-C) for constipation/hematochezia  - She is taking Flintstone vitamins with iron, tolerating this well.  (Unable to tolerate ferrous sulfate  due to constipation) - Most recent IV Feraheme  x 2 in December 2024 to January 2025 - Positive for fatigue - Most recent labs (01/06/2024): Hgb 12.3/MCV 82.0, ferritin 178, iron saturation 19%. LDH elevated to 250. B12 normal at 524, MMA normal. Mildly low folate 5.3. - PLAN: Overall, her blood and iron levels have been improved following her hysterectomy in June 2024.  She has not had any IV iron infusions since January 2025.   - No indication for IV iron at this time. - Recommend starting the folic acid  400 mcg MWF - Continue daily multivitamin - Discussed with patient that ongoing fatigue is unrelated to anemia or iron deficiency at this time.   - Recommend repeat labs with RTC in 6 months.  If no recurrent iron deficiency or anemia at that time, recommend discharge to PCP.    2.  Other concerns - At prior visit, patient had reported pelvic and joint pain, as well as numbness and tingling in lower extremities - As her autoimmune screening labs and B12 labs were normal, we have recommended follow-up with PCP. - PLAN: Patient should discuss with her PCP.  3.  Social/Family History: - Works in the school system,  including some physical labor. No tobacco use.  - Mother had anemia and sickle cell trait (questionable?). Father had stomach cancer.  Maternal grandfather had colon cancer. Maternal grandmother had uterine cancer.   PLAN SUMMARY: >> Rx to pharmacy for folic acid  >> Labs in 6 months = CBC/D,  ferritin, iron/TIBC, folate >> OFFICE visit in 6 months (1 week after labs)     REVIEW OF SYSTEMS:   Review of Systems  Constitutional:  Negative for chills, diaphoresis, fever, malaise/fatigue and weight loss.  Respiratory:  Negative for cough and shortness of breath.   Cardiovascular:  Negative for chest pain and palpitations.  Gastrointestinal:  Positive for constipation and diarrhea. Negative for abdominal pain, blood in stool, melena, nausea and vomiting.  Neurological:  Positive for tingling and headaches. Negative for dizziness.  Psychiatric/Behavioral:  The patient has insomnia.      PHYSICAL EXAM: (per limitations of virtual telephone visit)  The patient is alert and oriented x 3, exhibiting adequate mentation, good mood, and ability to speak in full sentences and execute sound judgement.  WRAP UP:   I discussed the assessment and treatment plan with the patient. The patient was provided an opportunity to ask questions and all were answered. The patient agreed with the plan and demonstrated an understanding of the instructions.   The patient was advised to call back or seek an in-person evaluation if the symptoms worsen or if the condition fails to improve as anticipated.  I provided 22 minutes of non-face-to-face time during this encounter, including > 10 minutes of medical discussion.  Pleasant Hannah Barefoot, PA-C 01/13/2024 8:49 AM  "

## 2024-01-13 ENCOUNTER — Inpatient Hospital Stay: Admitting: Physician Assistant

## 2024-01-13 DIAGNOSIS — Z09 Encounter for follow-up examination after completed treatment for conditions other than malignant neoplasm: Secondary | ICD-10-CM

## 2024-01-13 DIAGNOSIS — E538 Deficiency of other specified B group vitamins: Secondary | ICD-10-CM

## 2024-01-13 DIAGNOSIS — Z862 Personal history of diseases of the blood and blood-forming organs and certain disorders involving the immune mechanism: Secondary | ICD-10-CM | POA: Diagnosis not present

## 2024-01-13 DIAGNOSIS — D500 Iron deficiency anemia secondary to blood loss (chronic): Secondary | ICD-10-CM

## 2024-01-13 MED ORDER — FOLIC ACID 400 MCG PO TABS: 400.0000 ug | ORAL_TABLET | ORAL | 1 refills | Status: AC

## 2024-01-19 ENCOUNTER — Encounter: Payer: Self-pay | Admitting: *Deleted

## 2024-03-17 ENCOUNTER — Ambulatory Visit: Admitting: Gastroenterology

## 2024-07-12 ENCOUNTER — Inpatient Hospital Stay

## 2024-07-19 ENCOUNTER — Inpatient Hospital Stay: Admitting: Physician Assistant
